# Patient Record
Sex: Male | Born: 1950 | Race: Black or African American | Hispanic: No | Marital: Single | Smoking: Current every day smoker
Health system: Southern US, Community
[De-identification: ages and names within clinical notes are randomized; demographics above are authoritative.]

## PROBLEM LIST (undated history)

## (undated) DIAGNOSIS — R55 Syncope and collapse: Secondary | ICD-10-CM

## (undated) DIAGNOSIS — R4689 Other symptoms and signs involving appearance and behavior: Secondary | ICD-10-CM

## (undated) DIAGNOSIS — F209 Schizophrenia, unspecified: Secondary | ICD-10-CM

## (undated) DIAGNOSIS — R569 Unspecified convulsions: Secondary | ICD-10-CM

## (undated) DIAGNOSIS — G2401 Drug induced subacute dyskinesia: Secondary | ICD-10-CM

## (undated) DIAGNOSIS — E785 Hyperlipidemia, unspecified: Secondary | ICD-10-CM

## (undated) DIAGNOSIS — I10 Essential (primary) hypertension: Secondary | ICD-10-CM

## (undated) DIAGNOSIS — E559 Vitamin D deficiency, unspecified: Secondary | ICD-10-CM

## (undated) DIAGNOSIS — K219 Gastro-esophageal reflux disease without esophagitis: Secondary | ICD-10-CM

## (undated) DIAGNOSIS — E162 Hypoglycemia, unspecified: Secondary | ICD-10-CM

## (undated) DIAGNOSIS — N390 Urinary tract infection, site not specified: Secondary | ICD-10-CM

## (undated) DIAGNOSIS — E871 Hypo-osmolality and hyponatremia: Secondary | ICD-10-CM

## (undated) DIAGNOSIS — F0390 Unspecified dementia without behavioral disturbance: Secondary | ICD-10-CM

## (undated) DIAGNOSIS — F419 Anxiety disorder, unspecified: Secondary | ICD-10-CM

## (undated) HISTORY — PX: EYE SURGERY: SHX253

## (undated) HISTORY — PX: FOOT SURGERY: SHX648

---

## 2005-08-26 ENCOUNTER — Emergency Department: Payer: Self-pay | Admitting: Emergency Medicine

## 2005-08-27 ENCOUNTER — Other Ambulatory Visit: Payer: Self-pay

## 2005-09-04 ENCOUNTER — Emergency Department: Payer: Self-pay | Admitting: Emergency Medicine

## 2005-09-04 ENCOUNTER — Other Ambulatory Visit: Payer: Self-pay

## 2012-07-08 ENCOUNTER — Emergency Department: Payer: Self-pay | Admitting: Emergency Medicine

## 2012-07-08 LAB — COMPREHENSIVE METABOLIC PANEL
Albumin: 3.6 g/dL (ref 3.4–5.0)
Anion Gap: 8 (ref 7–16)
Bilirubin,Total: 0.3 mg/dL (ref 0.2–1.0)
Calcium, Total: 8.6 mg/dL (ref 8.5–10.1)
Co2: 24 mmol/L (ref 21–32)
Creatinine: 0.9 mg/dL (ref 0.60–1.30)
EGFR (Non-African Amer.): >60
Glucose: 113 mg/dL — ABNORMAL HIGH (ref 65–99)
Potassium: 4.7 mmol/L (ref 3.5–5.1)
SGOT(AST): 21 U/L (ref 15–37)
Sodium: 128 mmol/L — ABNORMAL LOW (ref 136–145)

## 2012-07-08 LAB — URINALYSIS, COMPLETE
Blood: NEGATIVE
Glucose,UR: 50 mg/dL (ref 0–75)
Ketone: NEGATIVE
Nitrite: NEGATIVE
Ph: 7 (ref 4.5–8.0)
WBC UR: 2 /HPF (ref 0–5)

## 2012-07-08 LAB — DRUG SCREEN, URINE
Amphetamines, Ur Screen: NEGATIVE (ref ?–1000)
Barbiturates, Ur Screen: NEGATIVE (ref ?–200)
Benzodiazepine, Ur Scrn: NEGATIVE (ref ?–200)
Cocaine Metabolite,Ur ~~LOC~~: NEGATIVE (ref ?–300)
Opiate, Ur Screen: NEGATIVE (ref ?–300)

## 2012-07-08 LAB — ETHANOL
Ethanol %: <0.003 % (ref 0.000–0.080)
Ethanol: <3 mg/dL

## 2012-07-08 LAB — CBC
HCT: 35.1 % — ABNORMAL LOW (ref 40.0–52.0)
HGB: 11.3 g/dL — ABNORMAL LOW (ref 13.0–18.0)
MCH: 29.1 pg (ref 26.0–34.0)
MCHC: 32.2 g/dL (ref 32.0–36.0)
MCV: 90 fL (ref 80–100)
RDW: 13.7 % (ref 11.5–14.5)
WBC: 7.2 10*3/uL (ref 3.8–10.6)

## 2012-07-09 LAB — BASIC METABOLIC PANEL
Anion Gap: 7 (ref 7–16)
BUN: 16 mg/dL (ref 7–18)
Calcium, Total: 8.8 mg/dL (ref 8.5–10.1)
Co2: 24 mmol/L (ref 21–32)
Creatinine: 0.89 mg/dL (ref 0.60–1.30)
EGFR (African American): >60
Glucose: 129 mg/dL — ABNORMAL HIGH (ref 65–99)
Potassium: 4.5 mmol/L (ref 3.5–5.1)

## 2013-05-25 ENCOUNTER — Observation Stay: Payer: Self-pay | Admitting: Internal Medicine

## 2013-05-25 LAB — COMPREHENSIVE METABOLIC PANEL
Albumin: 3.7 g/dL (ref 3.4–5.0)
Alkaline Phosphatase: 72 U/L
BUN: 22 mg/dL — ABNORMAL HIGH (ref 7–18)
Chloride: 96 mmol/L — ABNORMAL LOW (ref 98–107)
Co2: 28 mmol/L (ref 21–32)
EGFR (African American): >60
EGFR (Non-African Amer.): >60
Glucose: 134 mg/dL — ABNORMAL HIGH (ref 65–99)
Osmolality: 262 (ref 275–301)
Potassium: 5.4 mmol/L — ABNORMAL HIGH (ref 3.5–5.1)

## 2013-05-25 LAB — TSH: Thyroid Stimulating Horm: 1.31 u[IU]/mL

## 2013-05-25 LAB — DRUG SCREEN, URINE
Amphetamines, Ur Screen: NEGATIVE (ref ?–1000)
Barbiturates, Ur Screen: NEGATIVE (ref ?–200)
Benzodiazepine, Ur Scrn: NEGATIVE (ref ?–200)
Cannabinoid 50 Ng, Ur ~~LOC~~: NEGATIVE (ref ?–50)
Cocaine Metabolite,Ur ~~LOC~~: NEGATIVE (ref ?–300)
MDMA (Ecstasy)Ur Screen: NEGATIVE (ref ?–500)
Phencyclidine (PCP) Ur S: NEGATIVE (ref ?–25)
Tricyclic, Ur Screen: POSITIVE (ref ?–1000)

## 2013-05-25 LAB — URINALYSIS, COMPLETE
Bilirubin,UR: NEGATIVE
Blood: NEGATIVE
Glucose,UR: NEGATIVE mg/dL (ref 0–75)
Ketone: NEGATIVE
Nitrite: NEGATIVE
Ph: 6 (ref 4.5–8.0)
Protein: NEGATIVE
Squamous Epithelial: <1

## 2013-05-25 LAB — VALPROIC ACID LEVEL: Valproic Acid: 63 ug/mL

## 2013-05-25 LAB — CBC
HGB: 10.4 g/dL — ABNORMAL LOW (ref 13.0–18.0)
MCH: 27.8 pg (ref 26.0–34.0)
MCHC: 31 g/dL — ABNORMAL LOW (ref 32.0–36.0)
Platelet: 428 10*3/uL (ref 150–440)
RBC: 3.76 10*6/uL — ABNORMAL LOW (ref 4.40–5.90)
RDW: 14.2 % (ref 11.5–14.5)
WBC: 7.5 10*3/uL (ref 3.8–10.6)

## 2013-05-25 LAB — ACETAMINOPHEN LEVEL: Acetaminophen: <2 ug/mL

## 2013-05-25 LAB — SALICYLATE LEVEL: Salicylates, Serum: <1.7 mg/dL

## 2013-05-25 LAB — ETHANOL: Ethanol %: <0.003 % (ref 0.000–0.080)

## 2013-05-25 LAB — HEMOGLOBIN A1C: Hemoglobin A1C: 6.9 % — ABNORMAL HIGH (ref 4.2–6.3)

## 2013-05-26 LAB — LIPID PANEL
Cholesterol: 134 mg/dL (ref 0–200)
Ldl Cholesterol, Calc: 74 mg/dL (ref 0–100)

## 2013-05-26 LAB — BASIC METABOLIC PANEL
Calcium, Total: 9.4 mg/dL (ref 8.5–10.1)
Chloride: 97 mmol/L — ABNORMAL LOW (ref 98–107)
Co2: 24 mmol/L (ref 21–32)
Creatinine: 1.04 mg/dL (ref 0.60–1.30)
EGFR (African American): >60
EGFR (Non-African Amer.): >60
Glucose: 115 mg/dL — ABNORMAL HIGH (ref 65–99)
Osmolality: 258 (ref 275–301)
Potassium: 4.9 mmol/L (ref 3.5–5.1)

## 2013-09-08 ENCOUNTER — Inpatient Hospital Stay: Payer: Self-pay | Admitting: Internal Medicine

## 2013-09-08 LAB — CBC
HCT: 36.2 % — AB (ref 40.0–52.0)
HGB: 12 g/dL — ABNORMAL LOW (ref 13.0–18.0)
MCH: 29.9 pg (ref 26.0–34.0)
MCHC: 33.1 g/dL (ref 32.0–36.0)
MCV: 91 fL (ref 80–100)
PLATELETS: 281 10*3/uL (ref 150–440)
RBC: 4 10*6/uL — ABNORMAL LOW (ref 4.40–5.90)
RDW: 14.9 % — ABNORMAL HIGH (ref 11.5–14.5)
WBC: 7.1 10*3/uL (ref 3.8–10.6)

## 2013-09-08 LAB — URINALYSIS, COMPLETE
Bilirubin,UR: NEGATIVE
Blood: NEGATIVE
Glucose,UR: NEGATIVE mg/dL (ref 0–75)
Ketone: NEGATIVE
NITRITE: NEGATIVE
PH: 8 (ref 4.5–8.0)
PROTEIN: NEGATIVE
RBC,UR: 2 /HPF (ref 0–5)
SPECIFIC GRAVITY: 1.008 (ref 1.003–1.030)
Squamous Epithelial: NONE SEEN

## 2013-09-08 LAB — COMPREHENSIVE METABOLIC PANEL
ALT: 22 U/L (ref 12–78)
AST: 15 U/L (ref 15–37)
Albumin: 3.3 g/dL — ABNORMAL LOW (ref 3.4–5.0)
Alkaline Phosphatase: 63 U/L
Anion Gap: 5 — ABNORMAL LOW (ref 7–16)
BUN: 12 mg/dL (ref 7–18)
Bilirubin,Total: 0.3 mg/dL (ref 0.2–1.0)
CALCIUM: 9.3 mg/dL (ref 8.5–10.1)
Chloride: 95 mmol/L — ABNORMAL LOW (ref 98–107)
Co2: 26 mmol/L (ref 21–32)
Creatinine: 0.99 mg/dL (ref 0.60–1.30)
EGFR (African American): >60
Glucose: 146 mg/dL — ABNORMAL HIGH (ref 65–99)
Osmolality: 256 (ref 275–301)
POTASSIUM: 4.5 mmol/L (ref 3.5–5.1)
SODIUM: 126 mmol/L — AB (ref 136–145)
Total Protein: 7.2 g/dL (ref 6.4–8.2)

## 2013-09-08 LAB — VALPROIC ACID LEVEL: VALPROIC ACID: 77 ug/mL

## 2013-09-08 LAB — TROPONIN I

## 2013-09-09 LAB — BASIC METABOLIC PANEL
Anion Gap: 8 (ref 7–16)
BUN: 14 mg/dL (ref 7–18)
CO2: 22 mmol/L (ref 21–32)
Calcium, Total: 8.9 mg/dL (ref 8.5–10.1)
Chloride: 97 mmol/L — ABNORMAL LOW (ref 98–107)
Creatinine: 0.85 mg/dL (ref 0.60–1.30)
Glucose: 153 mg/dL — ABNORMAL HIGH (ref 65–99)
Osmolality: 259 (ref 275–301)
POTASSIUM: 4.6 mmol/L (ref 3.5–5.1)
SODIUM: 127 mmol/L — AB (ref 136–145)

## 2013-09-11 LAB — URINE CULTURE

## 2013-09-13 LAB — CULTURE, BLOOD (SINGLE)

## 2013-11-04 ENCOUNTER — Inpatient Hospital Stay: Payer: Self-pay | Admitting: Internal Medicine

## 2013-11-04 LAB — DRUG SCREEN, URINE
Amphetamines, Ur Screen: NEGATIVE (ref ?–1000)
BARBITURATES, UR SCREEN: NEGATIVE (ref ?–200)
Benzodiazepine, Ur Scrn: NEGATIVE (ref ?–200)
COCAINE METABOLITE, UR ~~LOC~~: NEGATIVE (ref ?–300)
Cannabinoid 50 Ng, Ur ~~LOC~~: NEGATIVE (ref ?–50)
MDMA (ECSTASY) UR SCREEN: NEGATIVE (ref ?–500)
Methadone, Ur Screen: NEGATIVE (ref ?–300)
Opiate, Ur Screen: NEGATIVE (ref ?–300)
Phencyclidine (PCP) Ur S: NEGATIVE (ref ?–25)
Tricyclic, Ur Screen: POSITIVE (ref ?–1000)

## 2013-11-04 LAB — COMPREHENSIVE METABOLIC PANEL
ALBUMIN: 3.8 g/dL (ref 3.4–5.0)
Alkaline Phosphatase: 64 U/L
Anion Gap: 5 — ABNORMAL LOW (ref 7–16)
BUN: 15 mg/dL (ref 7–18)
Bilirubin,Total: 0.4 mg/dL (ref 0.2–1.0)
CREATININE: 1.03 mg/dL (ref 0.60–1.30)
Calcium, Total: 8.7 mg/dL (ref 8.5–10.1)
Chloride: 94 mmol/L — ABNORMAL LOW (ref 98–107)
Co2: 23 mmol/L (ref 21–32)
EGFR (African American): >60
GLUCOSE: 40 mg/dL — AB (ref 65–99)
Osmolality: 243 (ref 275–301)
Potassium: 4.2 mmol/L (ref 3.5–5.1)
SGOT(AST): 34 U/L (ref 15–37)
SGPT (ALT): 18 U/L (ref 12–78)
SODIUM: 122 mmol/L — AB (ref 136–145)
Total Protein: 7.3 g/dL (ref 6.4–8.2)

## 2013-11-04 LAB — URINALYSIS, COMPLETE
Bilirubin,UR: NEGATIVE
Glucose,UR: NEGATIVE mg/dL (ref 0–75)
Ketone: NEGATIVE
Nitrite: NEGATIVE
PH: 6 (ref 4.5–8.0)
PROTEIN: NEGATIVE
Specific Gravity: 1.005 (ref 1.003–1.030)
Squamous Epithelial: 1
WBC UR: 120 /HPF (ref 0–5)

## 2013-11-04 LAB — CBC
HCT: 32.2 % — AB (ref 40.0–52.0)
HGB: 10.6 g/dL — ABNORMAL LOW (ref 13.0–18.0)
MCH: 29.6 pg (ref 26.0–34.0)
MCHC: 33 g/dL (ref 32.0–36.0)
MCV: 90 fL (ref 80–100)
PLATELETS: 245 10*3/uL (ref 150–440)
RBC: 3.59 10*6/uL — ABNORMAL LOW (ref 4.40–5.90)
RDW: 14.5 % (ref 11.5–14.5)
WBC: 4.9 10*3/uL (ref 3.8–10.6)

## 2013-11-04 LAB — TROPONIN I: Troponin-I: <0.02 ng/mL

## 2013-11-05 LAB — MAGNESIUM: Magnesium: 1.8 mg/dL

## 2013-11-05 LAB — BASIC METABOLIC PANEL
ANION GAP: 5 — AB (ref 7–16)
BUN: 12 mg/dL (ref 7–18)
CO2: 25 mmol/L (ref 21–32)
CREATININE: 0.91 mg/dL (ref 0.60–1.30)
Calcium, Total: 8.5 mg/dL (ref 8.5–10.1)
Chloride: 97 mmol/L — ABNORMAL LOW (ref 98–107)
EGFR (African American): >60
EGFR (Non-African Amer.): >60
Glucose: 108 mg/dL — ABNORMAL HIGH (ref 65–99)
Osmolality: 256 (ref 275–301)
Potassium: 4.2 mmol/L (ref 3.5–5.1)
Sodium: 127 mmol/L — ABNORMAL LOW (ref 136–145)

## 2013-11-05 LAB — HEMOGLOBIN A1C: Hemoglobin A1C: 6.1 % (ref 4.2–6.3)

## 2013-11-06 LAB — BASIC METABOLIC PANEL
ANION GAP: 5 — AB (ref 7–16)
BUN: 10 mg/dL (ref 7–18)
CO2: 24 mmol/L (ref 21–32)
Calcium, Total: 8.7 mg/dL (ref 8.5–10.1)
Chloride: 98 mmol/L (ref 98–107)
Creatinine: 0.86 mg/dL (ref 0.60–1.30)
EGFR (African American): >60
EGFR (Non-African Amer.): >60
Glucose: 123 mg/dL — ABNORMAL HIGH (ref 65–99)
OSMOLALITY: 256 (ref 275–301)
Potassium: 4.3 mmol/L (ref 3.5–5.1)
SODIUM: 127 mmol/L — AB (ref 136–145)

## 2013-11-06 LAB — URINE CULTURE

## 2013-11-09 ENCOUNTER — Emergency Department: Payer: Self-pay | Admitting: Emergency Medicine

## 2013-11-09 LAB — URINALYSIS, COMPLETE
Bilirubin,UR: NEGATIVE
Blood: NEGATIVE
Glucose,UR: NEGATIVE mg/dL (ref 0–75)
Ketone: NEGATIVE
Leukocyte Esterase: NEGATIVE
NITRITE: NEGATIVE
PH: 7 (ref 4.5–8.0)
Protein: NEGATIVE
Specific Gravity: 1.003 (ref 1.003–1.030)
Squamous Epithelial: 1

## 2013-11-09 LAB — CULTURE, BLOOD (SINGLE)

## 2013-11-10 LAB — COMPREHENSIVE METABOLIC PANEL
ALBUMIN: 3.8 g/dL (ref 3.4–5.0)
ALK PHOS: 62 U/L
Anion Gap: 5 — ABNORMAL LOW (ref 7–16)
BUN: 12 mg/dL (ref 7–18)
Bilirubin,Total: 0.3 mg/dL (ref 0.2–1.0)
CALCIUM: 9.1 mg/dL (ref 8.5–10.1)
CREATININE: 0.85 mg/dL (ref 0.60–1.30)
Chloride: 91 mmol/L — ABNORMAL LOW (ref 98–107)
Co2: 26 mmol/L (ref 21–32)
EGFR (African American): >60
EGFR (Non-African Amer.): >60
GLUCOSE: 162 mg/dL — AB (ref 65–99)
OSMOLALITY: 249 (ref 275–301)
POTASSIUM: 4.9 mmol/L (ref 3.5–5.1)
SGOT(AST): 28 U/L (ref 15–37)
SGPT (ALT): 25 U/L (ref 12–78)
SODIUM: 122 mmol/L — AB (ref 136–145)
Total Protein: 7.4 g/dL (ref 6.4–8.2)

## 2013-11-10 LAB — CBC
HCT: 32.6 % — ABNORMAL LOW (ref 40.0–52.0)
HGB: 10.4 g/dL — AB (ref 13.0–18.0)
MCH: 28.8 pg (ref 26.0–34.0)
MCHC: 31.9 g/dL — ABNORMAL LOW (ref 32.0–36.0)
MCV: 90 fL (ref 80–100)
PLATELETS: 293 10*3/uL (ref 150–440)
RBC: 3.61 10*6/uL — ABNORMAL LOW (ref 4.40–5.90)
RDW: 14.4 % (ref 11.5–14.5)
WBC: 5.3 10*3/uL (ref 3.8–10.6)

## 2013-11-10 LAB — URINALYSIS, COMPLETE
BACTERIA: NONE SEEN
Bilirubin,UR: NEGATIVE
Blood: NEGATIVE
Glucose,UR: NEGATIVE mg/dL (ref 0–75)
KETONE: NEGATIVE
Leukocyte Esterase: NEGATIVE
Nitrite: NEGATIVE
Ph: 7 (ref 4.5–8.0)
Protein: NEGATIVE
Specific Gravity: 1.005 (ref 1.003–1.030)
Squamous Epithelial: <1
WBC UR: 2 /HPF (ref 0–5)

## 2013-11-10 LAB — URIC ACID: Uric Acid: 3.4 mg/dL — ABNORMAL LOW (ref 3.5–7.2)

## 2013-11-10 LAB — DRUG SCREEN, URINE

## 2013-11-10 LAB — ETHANOL
Ethanol %: <0.003 % (ref 0.000–0.080)
Ethanol: <3 mg/dL

## 2013-11-10 LAB — OSMOLALITY: Osmolality: 276 mOsm/kg — ABNORMAL LOW (ref 280–301)

## 2013-11-10 LAB — SALICYLATE LEVEL

## 2013-11-10 LAB — ACETAMINOPHEN LEVEL: Acetaminophen: <2 ug/mL

## 2013-11-11 LAB — SODIUM, URINE, RANDOM: SODIUM, URINE RANDOM: 45 mmol/L (ref 20–110)

## 2013-11-11 LAB — VALPROIC ACID LEVEL: Valproic Acid: 34 ug/mL — ABNORMAL LOW

## 2013-11-11 LAB — OSMOLALITY, URINE: OSMOLALITY: 212 mosm/kg

## 2013-11-12 LAB — COMPREHENSIVE METABOLIC PANEL
Albumin: 3.5 g/dL (ref 3.4–5.0)
Alkaline Phosphatase: 53 U/L
Anion Gap: 5 — ABNORMAL LOW (ref 7–16)
BILIRUBIN TOTAL: 0.3 mg/dL (ref 0.2–1.0)
BUN: 21 mg/dL — ABNORMAL HIGH (ref 7–18)
Calcium, Total: 9.4 mg/dL (ref 8.5–10.1)
Chloride: 97 mmol/L — ABNORMAL LOW (ref 98–107)
Co2: 26 mmol/L (ref 21–32)
Creatinine: 0.95 mg/dL (ref 0.60–1.30)
GLUCOSE: 120 mg/dL — AB (ref 65–99)
Osmolality: 261 (ref 275–301)
Potassium: 4.9 mmol/L (ref 3.5–5.1)
SGOT(AST): 21 U/L (ref 15–37)
SGPT (ALT): 26 U/L (ref 12–78)
SODIUM: 128 mmol/L — AB (ref 136–145)
TOTAL PROTEIN: 7 g/dL (ref 6.4–8.2)

## 2013-11-12 LAB — CBC
HCT: 34.9 % — ABNORMAL LOW (ref 40.0–52.0)
HGB: 11.6 g/dL — AB (ref 13.0–18.0)
MCH: 30.4 pg (ref 26.0–34.0)
MCHC: 33.4 g/dL (ref 32.0–36.0)
MCV: 91 fL (ref 80–100)
Platelet: 314 10*3/uL (ref 150–440)
RBC: 3.83 10*6/uL — ABNORMAL LOW (ref 4.40–5.90)
RDW: 14.4 % (ref 11.5–14.5)
WBC: 7.1 10*3/uL (ref 3.8–10.6)

## 2013-11-13 ENCOUNTER — Inpatient Hospital Stay: Payer: Self-pay | Admitting: Psychiatry

## 2013-11-13 LAB — VALPROIC ACID LEVEL: Valproic Acid: 49 ug/mL — ABNORMAL LOW

## 2013-11-14 LAB — BASIC METABOLIC PANEL
Anion Gap: 5 — ABNORMAL LOW (ref 7–16)
BUN: 22 mg/dL — ABNORMAL HIGH (ref 7–18)
Calcium, Total: 9.1 mg/dL (ref 8.5–10.1)
Chloride: 92 mmol/L — ABNORMAL LOW (ref 98–107)
Co2: 27 mmol/L (ref 21–32)
Creatinine: 1.02 mg/dL (ref 0.60–1.30)
GLUCOSE: 141 mg/dL — AB (ref 65–99)
OSMOLALITY: 255 (ref 275–301)
Potassium: 5.2 mmol/L — ABNORMAL HIGH (ref 3.5–5.1)
Sodium: 124 mmol/L — ABNORMAL LOW (ref 136–145)

## 2013-11-15 LAB — BASIC METABOLIC PANEL
Anion Gap: 4 — ABNORMAL LOW (ref 7–16)
BUN: 14 mg/dL (ref 7–18)
CALCIUM: 9 mg/dL (ref 8.5–10.1)
Chloride: 93 mmol/L — ABNORMAL LOW (ref 98–107)
Co2: 27 mmol/L (ref 21–32)
Creatinine: 0.86 mg/dL (ref 0.60–1.30)
EGFR (African American): >60
EGFR (Non-African Amer.): >60
Glucose: 136 mg/dL — ABNORMAL HIGH (ref 65–99)
OSMOLALITY: 252 (ref 275–301)
Potassium: 5 mmol/L (ref 3.5–5.1)
Sodium: 124 mmol/L — ABNORMAL LOW (ref 136–145)

## 2013-11-15 LAB — HEMOGLOBIN A1C: HEMOGLOBIN A1C: 5.8 % (ref 4.2–6.3)

## 2013-11-16 LAB — AMMONIA: Ammonia, Plasma: 16 mcmol/L (ref 11–32)

## 2013-11-17 LAB — SODIUM: Sodium: 123 mmol/L — ABNORMAL LOW (ref 136–145)

## 2013-11-19 LAB — SODIUM: Sodium: 125 mmol/L — ABNORMAL LOW (ref 136–145)

## 2013-11-21 LAB — SODIUM: Sodium: 125 mmol/L — ABNORMAL LOW (ref 136–145)

## 2013-11-23 LAB — VALPROIC ACID LEVEL: Valproic Acid: 60 ug/mL

## 2014-04-22 ENCOUNTER — Emergency Department: Payer: Self-pay | Admitting: Emergency Medicine

## 2014-04-22 LAB — URINALYSIS, COMPLETE
BILIRUBIN, UR: NEGATIVE
Blood: NEGATIVE
Ketone: NEGATIVE
Nitrite: NEGATIVE
Ph: 8 (ref 4.5–8.0)
Protein: 100
Specific Gravity: 1.019 (ref 1.003–1.030)
Squamous Epithelial: 1

## 2014-04-22 LAB — CBC
HCT: 37.3 % — AB (ref 40.0–52.0)
HGB: 12 g/dL — AB (ref 13.0–18.0)
MCH: 29.8 pg (ref 26.0–34.0)
MCHC: 32.2 g/dL (ref 32.0–36.0)
MCV: 93 fL (ref 80–100)
PLATELETS: 273 10*3/uL (ref 150–440)
RBC: 4.04 10*6/uL — ABNORMAL LOW (ref 4.40–5.90)
RDW: 15.3 % — AB (ref 11.5–14.5)
WBC: 6 10*3/uL (ref 3.8–10.6)

## 2014-04-22 LAB — COMPREHENSIVE METABOLIC PANEL
AST: 31 U/L (ref 15–37)
Albumin: 3.4 g/dL (ref 3.4–5.0)
Alkaline Phosphatase: 81 U/L
Anion Gap: 9 (ref 7–16)
BUN: 22 mg/dL — ABNORMAL HIGH (ref 7–18)
Bilirubin,Total: 0.4 mg/dL (ref 0.2–1.0)
CALCIUM: 9 mg/dL (ref 8.5–10.1)
CO2: 24 mmol/L (ref 21–32)
Chloride: 100 mmol/L (ref 98–107)
Creatinine: 1.46 mg/dL — ABNORMAL HIGH (ref 0.60–1.30)
EGFR (Non-African Amer.): 52 — ABNORMAL LOW
Glucose: 173 mg/dL — ABNORMAL HIGH (ref 65–99)
Osmolality: 274 (ref 275–301)
POTASSIUM: 5.1 mmol/L (ref 3.5–5.1)
SGPT (ALT): 42 U/L
SODIUM: 133 mmol/L — AB (ref 136–145)
Total Protein: 7.8 g/dL (ref 6.4–8.2)

## 2014-04-22 LAB — PROTIME-INR
INR: 1
PROTHROMBIN TIME: 13.5 s (ref 11.5–14.7)

## 2014-04-22 LAB — APTT: Activated PTT: 28.3 secs (ref 23.6–35.9)

## 2014-04-22 LAB — TROPONIN I

## 2014-04-24 LAB — URINE CULTURE

## 2014-06-07 ENCOUNTER — Ambulatory Visit: Payer: Self-pay | Admitting: Urology

## 2014-07-07 ENCOUNTER — Emergency Department: Payer: Self-pay | Admitting: Emergency Medicine

## 2014-07-07 LAB — COMPREHENSIVE METABOLIC PANEL
Albumin: 3.3 g/dL — ABNORMAL LOW (ref 3.4–5.0)
Alkaline Phosphatase: 73 U/L (ref 46–116)
Anion Gap: 8 (ref 7–16)
BILIRUBIN TOTAL: 0.4 mg/dL (ref 0.2–1.0)
BUN: 19 mg/dL — AB (ref 7–18)
CHLORIDE: 104 mmol/L (ref 98–107)
CO2: 25 mmol/L (ref 21–32)
Calcium, Total: 9 mg/dL (ref 8.5–10.1)
Creatinine: 1.1 mg/dL (ref 0.60–1.30)
Glucose: 98 mg/dL (ref 65–99)
OSMOLALITY: 276 (ref 275–301)
POTASSIUM: 4.5 mmol/L (ref 3.5–5.1)
SGOT(AST): 29 U/L (ref 15–37)
SGPT (ALT): 39 U/L (ref 14–63)
Sodium: 137 mmol/L (ref 136–145)
TOTAL PROTEIN: 7.6 g/dL (ref 6.4–8.2)

## 2014-07-07 LAB — URINALYSIS, COMPLETE
Bilirubin,UR: NEGATIVE
Blood: NEGATIVE
Glucose,UR: 50 mg/dL (ref 0–75)
Ketone: NEGATIVE
NITRITE: POSITIVE
Ph: 8 (ref 4.5–8.0)
RBC,UR: 24 /HPF (ref 0–5)
SPECIFIC GRAVITY: 1.016 (ref 1.003–1.030)
Squamous Epithelial: <1

## 2014-07-07 LAB — CBC
HCT: 36.7 % — ABNORMAL LOW (ref 40.0–52.0)
HGB: 11.8 g/dL — ABNORMAL LOW (ref 13.0–18.0)
MCH: 30.2 pg (ref 26.0–34.0)
MCHC: 32 g/dL (ref 32.0–36.0)
MCV: 94 fL (ref 80–100)
Platelet: 270 10*3/uL (ref 150–440)
RBC: 3.89 10*6/uL — ABNORMAL LOW (ref 4.40–5.90)
RDW: 14.4 % (ref 11.5–14.5)
WBC: 6 10*3/uL (ref 3.8–10.6)

## 2014-07-07 LAB — DRUG SCREEN, URINE

## 2014-07-07 LAB — ACETAMINOPHEN LEVEL: Acetaminophen: <2 ug/mL

## 2014-07-07 LAB — SALICYLATE LEVEL

## 2014-07-07 LAB — TSH: Thyroid Stimulating Horm: 1.09 u[IU]/mL

## 2014-07-07 LAB — ETHANOL: Ethanol: <3 mg/dL

## 2014-09-24 NOTE — Consult Note (Signed)
PATIENT NAME:  Scott [name], Scott [name] MR#:  [id number] DATE OF BIRTH:  04/13/1951  DATE OF CONSULTATION:  07/09/2012  CONSULTING PHYSICIAN:  Gonzella Lex, MD  IDENTIFYING INFORMATION AND REASON FOR CONSULT:  A 64 year old man with a history of schizophrenia, brought to the Emergency Room under involuntary commitment petition. Consult for evaluation of appropriate psychiatric treatment.   HISTORY OF PRESENT ILLNESS:  Information obtained from the patient and the chart. Commitment petition states that the patient has been confused and talking out of his head recently. Expresses concern that his medication is not right. The patient is not very clear why he was brought into the hospital. He reports that he has recently had a urinary tract infection, but that he feels like it has gotten all better. He has been given some medication and says that he also was told he needed to drink a lot of water and that seems to have helped to clear it up. The patient states that he is compliant with his medicine. He denies any suicidal or homicidal ideation. Denies any current hallucinations. Denies that he has been behaving in a dangerous manner or having any run-ins with law enforcement. He describes himself as being able to take care of himself adequately at home. His brother is his guardian and comes by to see him regularly. The patient has aggressive community treatment services that come by frequently to drop off his medicine and check on him. The patient denies that he has been drinking alcohol or abusing any drugs.   PAST PSYCHIATRIC HISTORY:  The patient states that his last hospitalization was at the [facility] in [place] in 2012. He has been treated with Haldol Decanoate. Has a history of chronic schizophrenia. Denies any past history of suicidality or violence.  His current medication list according to the information forwarded to [place] includes Haldol Decanoate, nortriptyline 25 mg at night, Depakote 250 mg in the  morning and 500 mg at night, and Artane 5 mg twice a day. Also on some medications for COPD and dyslipidemia and gastric reflux.   SUBSTANCE ABUSE HISTORY:  The patient says he has not had a drink in 20 years. He denies that he abuses any other drugs.   SOCIAL HISTORY:  He lives independently in an apartment, but his brother is his guardian. The patient has ACT team services in the community. He says that he has support and friends in the neighborhood. It sounds like he probably spends most of his time around his apartment. According to the brother, the patient has been calling ambulances and having himself taken into the hospital for several days in a row. The patient's chief complaint about that today is his urinary tract infection.   PAST MEDICAL HISTORY:  Evidently, he does have a urinary tract infection with some evidence of that still on his urine analysis today. He has not been to the hospital to have it treated. He remembers that they told him to drink a lot of water, and so he has been trying to drink as much water as he can. Other than that, he has COPD and dyslipidemia and gastric reflux, based on his prescribed medications.   REVIEW OF SYSTEMS:  The patient has no specific complaints. Says that his mood feels fine. Denies hallucinations. Denies suicidal or homicidal ideation. Says that he feels full in his stomach, but that his bladder infection is feeling all better. Denies that he is having any acute pain. Denies any breathing problems or GI problems  or chest pain.   MENTAL STATUS EXAM:  A mildly disheveled man, who looks his stated age or older. Cooperative with the interview. Good eye contact. Normal psychomotor activity. Speech is dysarthric related to his tardive dyskinesia, but easy to understand if one concentrates.  Normal tone. Affect somewhat constricted but not inappropriate. Mood stated as being okay. Thoughts are a little bit scattered with a tendency towards tangential  discussions, but he did not make any grossly bizarre or paranoid statements. Denies hallucinations. Denies suicidal or homicidal ideation. The patient appears to probably be of average to low average baseline intelligence with preserved judgment and insight right now.   CURRENT MEDICATIONS:  According to the referral notes we have been given, he is taking ProAir 90 mcg inhaler as needed for shortness of breath, nortriptyline 25 mg at night, atorvastatin 20 mg at night, omeprazole 20 mg a day, Flonase nasal inhaler 2 sprays each nostril a day, Haldol Decanoate 100 mg a month, promethazine syrup every 8 hours as needed for shortness of breath, Keflex 500 mg once by mouth every 8 hours, which looks like it was just started 2 days ago, presumably that was for the urinary tract infection; metformin 1000 mg twice a day, fluoxetine 20 mg 3 of them in the morning, Depakote 250 mg in the morning and 500 at night, Artane 5 mg twice a day, docusate 100 mg twice a day.   ALLERGIES:  No known drug allergies.   LABORATORY DATA:  Drug screen positive for tricyclic antidepressants related to his nortriptyline use. Urinalysis still shows positive glucose, 1+ leukocyte esterase, 2 white blood cells. No bacteria. TSH normal. Alcohol undetectable. Glucose slightly elevated at 113, but sodium low at 128 and chloride low at 96. Hemoglobin low at 11.3, hematocrit low at 35.1.   ASSESSMENT: A 64 year old man with schizophrenia. Currently, he is not reporting any dangerous ideation and not showing any dangerous or aggressive behavior. He understands he needs to stay on his medication and is compliant with it. The brother has been reporting that he has been behaving strangely. ACT team has been visiting the patient and thinks it is probably related to his recently diagnosed urinary tract infection. On top of that, he currently has a low sodium of 128, which is almost certainly due to his own overindulgence in water, which he did in  order to treat the urinary tract infection. Currently, the patient is not meeting commitment criteria. Has an appropriate and safe place to stay and appropriate outpatient treatment in the community.   TREATMENT PLAN:  The patient is agreeable to getting his Haldol Decanoate shot right now. I am also going to give 1 gram shot of Rocephin for his urinary tract infection. He will be educated that he should stop overindulging in water. ACT team will be advised of his condition and will continue to follow up with him. I have talked with his brother who is agreeable to picking him up later.   DIAGNOSIS PRINCIPLE AND PRIMARY:  AXIS I: Schizophrenia, undifferentiated.   SECONDARY DIAGNOSES: AXIS I:  No further.  AXIS II:  No diagnosis.  AXIS III:  Urinary tract infection, diabetes on oral medication, chronic obstructive pulmonary disease, dyslipidemia, gastric reflux.  AXIS IV:  Moderate. Chronic stress from burden of illness.  AXIS V:  Functioning at time of discharge 26.    ____________________________ Gonzella Lex, MD jtc:dm D: 07/09/2012 10:55:55 ET T: 07/09/2012 11:41:39 ET JOB#: [id number]  cc: Gonzella Lex, MD, <Dictator> [facility]  Salley Scarlet MD ELECTRONICALLY SIGNED 07/10/2012 0:23
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
## 2014-09-24 NOTE — Consult Note (Signed)
Brief Consult Note: Diagnosis: schizophrenia.   Patient was seen by consultant.   Consult note dictated.   Orders entered.   Comments: Psychiatry: Patient seen. Has hx schizophrenia. Petitioned by brother. PAtient denies any SI or HI and is not aggressive. ACT team sees him and he is generally complient with meds. Has had recent UTI partially treated. Has also been over drinking water. No longer commitable. Will dc IVC. Spoke to brother who can pick him up.Will give Haldol dec 100mg  im today.  Electronic Signatures: Gonzella Lex (MD)  (Signed 05-Feb-14 10:43)  Authored: Brief Consult Note   Last Updated: 05-Feb-14 10:43 by Gonzella Lex (MD)

## 2014-09-24 NOTE — Consult Note (Signed)
Brief Consult Note: Diagnosis: Schizoaffective disorder depressed type. UTI delirium now resolved.   Patient was seen by consultant.   Recommend further assessment or treatment.   Orders entered.   Comments: Scott [name] has a h/o mental illness here for AMS from UTI. He received antibiotics and is alert and oriented, back to baseline. He is not suicidal, homicidal or psychotic.  PLAN: 1. The patient no longer meets criteria for IVC. I will terminate proceedings. Please discharge as appropriate.   2. He is to continue all medications as prescribed by [name], his primary psychiatrist. No Rx necessary.   3. We will call [name]  and his ACT team to let them know about discharge.  Electronic Signatures: Orson Slick (MD)  (Signed 23-Dec-14 14:52)  Authored: Brief Consult Note   Last Updated: 23-Dec-14 14:52 by Orson Slick (MD)

## 2014-09-24 NOTE — Discharge Summary (Signed)
PATIENT NAME:  Scott [name], Scott [name] MR#:  [id number] DATE OF BIRTH:  Aug 03, 1950  DATE OF ADMISSION:  05/25/2013 DATE OF DISCHARGE:  05/26/2013  PRIMARY CARE PHYSICIAN: Nonlocal.   CONSULTATIONS:  Behavior medicine physician, Dr. Bary Leriche.     DISCHARGE DIAGNOSES: 1.  Psychosis in a patient with a history of schizophrenia.  2.  Urinary tract infection.  3.  Hyperkalemia.  4.  Chronic hyponatremia.  5.  Hypertension.  6.  Diabetes.   CONDITION: Stable.   CODE STATUS: Full code.   HOME MEDICATIONS: Please refer to the [facility] physician discharge instruction medication reconciliation list.   DIET: Low-sodium, low-fat, low-cholesterol, ADA diet.   ACTIVITY: As tolerated.   FOLLOWUP CARE: Follow with PCP within 1 to 2 weeks. Follow up with psychiatrist, [name], as outpatient.   REASON FOR ADMISSION: Urinary tract infection, hyponatremia.   HOSPITAL COURSE: The patient is a 64 year old African American male with a history of schizophrenia, hypertension, diabetes, seizure disorder, was sent to ED for altered mental status and danger to self. For detailed history and physical examination, please refer to the admission note dictated by Dr. Darvin Neighbours. On admission date, laboratory data showed BUN 22, creatinine 1.11, glucose 134, sodium 128, potassium 5.4. Urine drug screen positive with tricyclic antidepressant. Urinalysis showed 1+ bacteriuria and 40 WBC. Chest x-ray no acute abnormality. Alcohol level less than 0.003.  1.  Psychosis with a history of schizophrenia. Behavior Medicine physician, Dr. Bary Leriche, evaluated the patient and suggested to continue schizophrenia medication. The patient was placed on sitter p.r.n. and IVC; however, Dr. Bary Leriche evaluated the patient said today, suggested to discontinue IVC and follow up with [name], the patient's psychiatrist, as outpatient.  2.  For UTI, the patient was treated with Rocephin. Will give Cipro p.o. for 5 days. The patient's white  count is normal.  3.  Hyperkalemia. After admission, the patient with repeat BMP potassium decreased to 4.9.  4.  Hyponatremia has been stable. The last sodium is 127. Which is the patient's baseline.   The patient has no complaints. Vital signs are stable. Physical examination is unremarkable.  The patient will be discharged and to home today. I discussed the patient's discharge plan with the patient, case Freight forwarder, Education officer, museum.   TIME SPENT: About 38 minutes.   ____________________________ Demetrios Loll, MD qc:cs D: 05/26/2013 15:05:00 ET T: 05/26/2013 15:23:58 ET JOB#: [id number]  cc: Demetrios Loll, MD, <Dictator> Demetrios Loll MD ELECTRONICALLY SIGNED 05/26/2013 17:06
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
## 2014-09-24 NOTE — Consult Note (Signed)
Brief Consult Note: Diagnosis: Schizoaffective disorder depressed type. UTI delirium, Hyponatremia from Depakote?.   Patient was seen by consultant.   Recommend further assessment or treatment.   Orders entered.   Comments: Mr. Junkin has a h/o mental illness and freguent UTI. His psychistrist sent him over worried about new infection. Reportedly, he has been compliant with medications and is on injectable haldol decanoate.   PLAN: 1. Will continue all medications as in the community: Prozac 60 mg, Artane 5 mg bid, Haldol dec injection 100 mg on 06/12/2013.   2. The patient is on Depakote, level pending. He has hyponatremnia, sometimes caused by Depakote. Will continue for now.   3. Psychiatry will follow along.  Electronic Signatures: Orson Slick (MD)  (Signed 22-Dec-14 13:54)  Authored: Brief Consult Note   Last Updated: 22-Dec-14 13:54 by Orson Slick (MD)

## 2014-09-24 NOTE — H&P (Signed)
PATIENT NAME:  Scott [name], Scott [name] MR#:  [id number] DATE OF BIRTH:  12-Sep-1950  DATE OF CONSULTATION:  05/25/2013  REFERRING PHYSICIAN:  Elta Guadeloupe R. Jacqualine Code, MD, of Emergency Room  CONSULTING PHYSICIAN:  [place]. Ruqayya Ventress, MD  REASON FOR CONSULTATION: Urinary tract infection, hyponatremia.   HISTORY OF PRESENT ILLNESS: A 64 year old male patient with history of schizophrenia, diabetes, hypertension, seizure disorder and tardive dyskinesia, presents to the Emergency Room brought in by the family for concern of altered mental status and danger to self. The patient presently is lying in bed, seems calm. He does say that he is at [facility] although he is not sure why he is in the hospital. Mentioned that he has had UTI in the past and was treated for this. His speech is a little difficult to understand but he mentions that his family is out to get him. He also mentions that they have taken all his possessions. He lives at home with 1 of his nephews. He states that he would like to get out of the hospital tomorrow. An IVC is in place as the patient was thought to be a danger to self. Presently, no family is at bedside. He seems to have mostly tangential thoughts.   PAST MEDICAL HISTORY: 1.  Diabetes mellitus type 2.  2.  Hypertension.  3.  Schizophrenia.  4.  Tardive dyskinesia.  5.  Seizures.  6.  Arthritis.  7.  GERD.    8.  Tobacco abuse.  9.  Chronic hyponatremia.   ALLERGIES: No known drug allergies.   SOCIAL HISTORY: The patient seems to live alone with 1 of his family members being his guardian. Does smoke every day although he is not sure how much. No alcohol. No illicit drugs. Does have a history of marijuana abuse.   REVIEW OF SYSTEMS: Unobtainable secondary to the patient's confusion although he denies any pain, shortness of breath and nausea.   HOME MEDICATIONS: Include:  1.  Fluoxetine 60 mg oral once a day.  2.  Docusate sodium 100 mg oral 2 times a day.  3.   Divalproex oral once a day at bedtime 2 tablets and 1 tablet in the morning.  4.  The patient was on multiple other medications although it is not clear if he stopped taking them or we do not have his complete home medication list. He was on diabetes and high blood pressure medications along with statin and multivitamin in the past.   FAMILY HISTORY: Reviewed but could not be obtained from the patient.   PHYSICAL EXAMINATION: VITAL SIGNS: Temperature 97.5, pulse of 78, blood pressure 122/80, saturating 98% on room air.  GENERAL: Obese African American male patient lying in bed, seems calm and pleasant.  HEENT: Atraumatic, normocephalic. Oral mucosa moist and pink. External ears and nose normal. No pallor. No icterus. Pupils bilaterally equal and reactive to light. NECK:  Supple, no thyromegaly or palpable lymph nodes. Trachea midline. No carotid bruit or JVD.  CARDIOVASCULAR: S1, S2, without any murmurs. Peripheral pulses 2+.  RESPIRATORY: Normal work of breathing. Clear to auscultation on both sides.  GASTROINTESTINAL: Soft abdomen, nontender. Bowel sounds present, no visceromegaly  palpable.  GENITOURINARY: No significant bladder distention.  SKIN: Warm and dry. No petechiae, rash, ulcers.  MUSCULOSKELETAL: No joint swelling, redness, effusion of the large joints. Normal muscle tone.  NEUROLOGICAL: Motor strength 5 out of 5 in upper and lower extremities. Sensation is intact all over.  LYMPHATIC: No cervical lymphadenopathy.  PSYCHIATRIC: Has  tardive dyskinesia with difficult-to-understand speech. Seems to have tangential thoughts and some delusions although he denies any suicidal ideation or thoughts of hurting himself. He is oriented to person, place, but not to time.   LABORATORY, DIAGNOSTIC AND RADIOLOGICAL DATA:   1.  Glucose 134, BUN 22, creatinine 1.11, sodium 128, potassium 5.4, chloride of 96, GFR greater than 60. AST, alkaline phosphatase and bilirubin normal.  2.  TSH of 1.31.   3.  Urine drug screen positive for tricyclic antidepressants.  4.  WBC 7.5, hemoglobin 10.4, platelets of 428.  5.  Urinalysis shows 1+ bacteria and 40 WBC.  6.  Acetaminophen less than 2, salicylates less than 1.7. Alcohol less than 0.003.  7.  Chest x-ray, PA and lateral, shows nothing acute.   ASSESSMENT AND PLAN: 1.  Psychosis in a patient with history of schizophrenia. The patient does have a urinary tract  flexion but is afebrile with normal white count. Blood pressure and heart rate are normal. I believe his symptoms are more to do with his schizophrenia, mental disorder rather than urinary tract infection. I have discussed with Dr. Jacqualine Code of Emergency Room. The patient will be seen by psychiatrist and once that consult is done we can better figure out if patient needs any medical admission but I believe he will do well on oral antibiotics after giving a dose of IV ceftriaxone here in the Emergency Room.  2.  Hyponatremia. Seems chronic and stable.  3.  Hyperkalemia of 5.4. The patient has been on ACE inhibitor with lisinopril.  I would hold this and repeat potassium in 3 to 4 days.  4.  Diabetes mellitus. Can continue home medications.   Discussed the case with the ER physician, Delman Kitten, MD, who will discuss case with the psychiatrist.   TIME SPENT: Today on this consult was 40 minutes.  AFter discussing with psychicatry it was felt pt has more of delirium due to UTI and is being admitted for IV abx. Transfer to psych when better. ____________________________ Leia Alf Mikhala Kenan, MD srs:cs D: 05/25/2013 14:04:53 ET T: 05/25/2013 14:38:06 ET JOB#: [id number]  cc: Alveta Heimlich R. Darvin Neighbours, MD, <Dictator> Neita Carp MD ELECTRONICALLY SIGNED 05/25/2013 16:38
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
## 2014-09-25 NOTE — Consult Note (Signed)
PATIENT NAME:  Scott [name], Scott [name] MR#:  [id number] DATE OF BIRTH:  Sep 18, 1950  DATE OF CONSULTATION:  11/10/2013  REFERRING PHYSICIAN:  Elta Guadeloupe R. Jacqualine Code, MD CONSULTING PHYSICIAN:  Cordelia Pen. Gretel Acre, MD  REASON FOR CONSULTATION: "I don't know why I'm here. I want my money. Call Megan."   HISTORY OF PRESENT ILLNESS: The patient is a 64 year old African American male who presented to the ER after he became increasingly agitated at the [facility]. He appears to be an unreliable historian. Patient was noted to be mumbling, his tongue appears somewhat swollen. He has a history of schizophrenia and he is currently living at the [facility]. According to the history, which was obtained by Amy who is the director, the patient was sent to the [facility] from the medical floor on 09/11/2013. He was able to follow directions until approximately last week. She reported on 11/09/2013 he started pacing and became more agitated and paranoid. He started urinating outside the building. He was taking other employees' drinks. He was brought to the ER and was treated for urinary tract infection and hypoglycemia. He was agitated, paranoid and demanding the staff to the money  he owed. He again was noted to be urinating in the front of the building and was smoking in his room and putting it out on the carpet. The patient was combative and was brought to the ER as he was not manageable at the [facility]. They were concerned about his behavior as well as  his interaction with other staff and peers.   During my interview, the patient was asking me to call his sister as well as to call Vicente Serene, who is taking care of his money. He reported that he lives in [place] all of his life. He reported that when he gets the shot if calms his nerves down. The patient's tongue was noted to be a little bit swollen and it was moving around in his mouth. He has history of tardive dyskinesia in the past. He reported that he yells and shouts but  nobody listens to him. He appeared to be rambling during the interview. He appears to have poor insight about his mental illness as well.   PAST PSYCHIATRIC HISTORY: The patient has history of schizophrenia and is currently following with [name], who has been giving him Haldol decanoate injections 125 mg IM every 4 weeks. He has recently received his injection on 11/05/2013. He also gets Artane to control the tardive dyskinesias. The patient is currently compliant with his medications. No history of suicide attempts known.   PAST MEDICAL HISTORY: The patient has history of diabetes, hypertension, tardive dyskinesia, seizure, arthritis, GERD, tobacco abuse, chronic hyponatremia.   ALLERGIES: No known drug allergies.   CURRENT HOME MEDICATIONS: Sertraline 100 mg p.o. daily, omeprazole 20 mg p.o. daily, nortriptyline 25 mg at bedtime, naproxen 500 mg p.o. b.i.d., multivitamin 1 tablet daily, Lopressor 50 mg once daily, metformin 1000 mg b.i.d., lisinopril 40 mg 1-1/2 tablets once a day, glipizide 10 mg daily, Colace 100 mg b.i.d., Depakote 250 mg in the morning and 500 mg 3 pills at bedtime, cetirizine 10 mg daily, cephalexin 500 mg q.12 hours, atorvastatin 20 mg at bedtime, Norvasc 10 mg daily, Artane 5 mg p.o. b.i.d.   PHYSICAL EXAMINATION:  VITAL SIGNS: Temperature 97.9, pulse 68, respirations 18, blood pressure 128/82.   LABORATORY DATA:  Glucose 198, BUN 12, creatinine 0.85 sodium 122, potassium 4.9, chloride 91, bicarbonate 26, anion gap 5.  Uric acid is  3.4. UDS is negative. WBC 5.3, hemoglobin is 10.4, hematocrit is 32.6, RDW is 14.4.   MENTAL STATUS EXAMINATION: The patient is a moderately-built male who appears somewhat disheveled and lying in the bed. He has some movement of the tongue noted at this time. Gait and station appears normal. His speech was rambling, difficult to understand. Thought process was tangential. Loose associations were noted. Thought content was delusional and  unable  to contract for safety at this time. Insight and judgment were poor. Awake, alert and oriented. Recent and remote memory were poor. Attention span and concentration were short. Language was difficult to understand. Mood was fine. Affect was congruent.   DIAGNOSTIC IMPRESSION: AXIS I:  Schizophrenia, chronic, paranoid type.  AXIS II: None reported.  AXIS III: Please review the medical history.   TREATMENT PLAN: 1.  I will try to obtain collateral information from [name]' office about his medications.  2.  I will start him on Benadryl 25 mg p.o. t.i.d. to help with the tardive dyskinesia.  3.  Will order a Depakote level at this time.  4.  The patient will be monitored and will have his medications adjusted before he can return back to his group home. Will continue to have his medications adjusted will also tried to obtain alternate placement for this patient.   Thank you for allowing me to participate in the care of this patient.   ____________________________ Cordelia Pen. Gretel Acre, MD usf:cs D: 11/10/2013 14:12:25 ET T: 11/10/2013 18:27:58 ET JOB#: [id number]  cc: Cordelia Pen. Gretel Acre, MD, <Dictator> Jeronimo Norma MD ELECTRONICALLY SIGNED 11/19/2013 16:52

## 2014-09-25 NOTE — Discharge Summary (Signed)
PATIENT NAME:  Scott [name], Scott [name] MR#:  [id number] DATE OF BIRTH:  Apr 09, 1951  DATE OF ADMISSION:  09/08/2013  DATE OF DISCHARGE:  09/11/2013  PRESENTING COMPLAINT:  Confusion.   DISCHARGE DIAGNOSES:   1.  Acute encephalopathy secondary to his UTI.  2.  Chronic hyponatremia secondary to Depakote. 3.  Chronic schizophrenia.  4.  Hypertension.  5.  DVT. 6.  Type 2 diabetes.   PSYCHIATRY CONSULTATION:  Dr. Bary Leriche.   CODE STATUS: Full code.   DISPOSITION:  Patient is discharged to assisted living facility.   DISCHARGE MEDICATIONS: 1.  Docusate 100 mg b.i.d.  2.  Trihexyphenidyl 500 mg 3 tablets daily.  3.  Divalproex 250 extended release once a day.  4.  Amlodipine 10 mg daily.  5.  Metoprolol ER 50 mg daily.  6.  Omeprazole 20 mg daily.  7.  Lisinopril 40 mg 1-1/2 tablets p.o. daily.  8.  Metformin 1000 mg b.i.d.  9.  Sertraline 100 mg p.o. daily.  10. Atorvastatin 20 mg at bedtime.  11. Nortriptyline 25 mg at bedtime.  12. Cetirizine 10 mg daily.  13. Glipizide 10 mg daily.  14.  Multivitamin p.o. daily.  15. Naproxen 500 mg b.i.d. as needed.  16. Keflex 500 mg b.i.d.   DIET:  Carbohydrate controlled diet.   FOLLOW UP:  With Dr. Conley Canal, patients psychiatrist. Follow up with PCP in 1-2 weeks.   BRIEF SUMMARY OF HOSPITAL COURSE:  [name] is a 64 year old African American gentleman with history of chronic schizophrenia, type 2 diabetes, comes in with:  1.  Acute encephalopathy, which suspected due to UTI. He was started on IV Rocephin and changed to p.o. Keflex. Blood cultures were negative. The patient was afebrile.  2.  Hypertension. Continued his home medications. 3.  Chronic hyponatremia, which remained stable. Suspected due to chronic use of Depakote. 4.  Type 2 diabetes. Resume p.o. meds.  5.  Chronic schizophrenia. The patient was seen by Dr. Bary Leriche. Recommends to continue home medications. Patient did receive his IM Haldol dose recently. Spoke with  Dr. Conley Canal, patient psychiatrist. Patient is being recommended to go to an assisted living facility and arrangements have been made by care managers. Hospital stay otherwise remained stable. The patient remained a full code.   TIME SPENT:  40 minutes.     ____________________________ Scott Rochester Posey Pronto, MD sap:tc D: 09/15/2013 11:26:00 ET T: 09/15/2013 12:03:17 ET JOB#: [id number]  cc: Daniil Labarge A. Posey Pronto, MD, <Dictator>

## 2014-09-25 NOTE — H&P (Signed)
PATIENT NAME:  Scott [name], Scott [name] MR#:  [id number] DATE OF BIRTH:  1950/09/30  DATE OF ADMISSION:  09/08/2013  CHIEF COMPLAINT: Confusion.   HISTORY OF PRESENT ILLNESS: 64 year old African American male patient with history of schizophrenia, diabetes, hypertension, seizure disorder, and tardive dyskinesia presents to the Emergency Room after his social worker found him to be confused, not talking. Patient was diagnosed with UTI yesterday, was started on some antibiotic, but has worsened and brought to the Emergency Room here. Patient is nonverbal. He has refused to speak. His urinalysis suggests UTI. Patient has been given dose of IV ceftriaxone in the Emergency Room and is being admitted to hospitalist service for UTI and encephalopathy.   The patient was last admitted to the hospitalist service in December 2014 with similar complaints.   PAST MEDICAL HISTORY:  1.  Diabetes mellitus, type 2.  2.  Hypertension.  3.  Schizophrenia  4.  Mixtard of dyskinesia.  5.  Seizures.  6.  Arthritis.  7.  GERD.  8.  Tobacco abuse.  9.  Chronic hyponatremia around 125.   ALLERGIES:  No known drug allergies.   SOCIAL HISTORY: The patient lives alone with his nephew. He follows up with ACT team. He has history of marijuana abuse in the past and smokes every day. No alcohol.   REVIEW OF SYSTEMS: Unobtainable secondary to the patient's confusion.   HOME MEDICATIONS:  1.  Valproate 1500 mg oral once daily. 3.  Atorvastatin 20 mg daily.  4.  Cetirizine 10 mg daily.  5.  Docusate sodium 100 mg 2 times a day.  6.  Glipizide 10 mg once a day.  7.  Enalapril 40 mg once a day.  8.  Metformin 1000 mg oral 2 times a day. 9.  Metoprolol 50 mg oral once a day.  10. Multivitamin 1 tablet daily.  11. Naproxen 500 mg oral 2 times a day as needed.  12. Nortriptyline 25 mg oral once a day.  13. Omeprazole 20 mg once a day.  14. Sertraline 100 mg oral once a day.  15.  Trihexyphenidyl 5 mg oral 2 times a day.    FAMILY HISTORY: Reviewed but cannot be obtained from the patient or old records.   PHYSICAL EXAMINATION:  VITAL SIGNS: Temperature 98.4, pulse of 67, blood pressure 163/80, saturating 97% on room air.  GENERAL: Obese African American male patient lying in bed. Overall seems calm but has a flat affect. Alert and awake.  HEENT: Atraumatic, normocephalic. Oral mucosa cannot be checked, as the patient does not open his mouth. Pupils bilaterally equal and reactive to light.  NECK: Supple. No thyromegaly. No palpable lymph nodes. Trachea midline. No carotid bruit or JVD.   CARDIOVASCULAR: S1, S2, without any murmurs. Peripheral pulses 2+. No edema.  RESPIRATORY: Normal work of breathing. Clear to auscultation on both sides.  GASTROINTESTINAL: Soft abdomen, nontender. Bowel sounds present. No hepatosplenomegaly palpable.  GENITOURINARY: Has suprapubic tenderness. No CVA tenderness. Has a Foley catheter in place.  MUSCULOSKELETAL: No joint swelling, redness, effusion of the large joints. Normal muscle tone.  NEUROLOGICAL: Moves all 4 extremities equally.  LYMPHATIC: No cervical lymphadenopathy.   LABORATORY STUDIES:  WBC 7.1, hemoglobin 12, platelets of 281,000.   Urinalysis shows 59 WBC, 1+ bacteria with valproic acid level of 77.   Troponin less than 0.02.   AST, ALT, alkaline phosphatase, bilirubin normal. Sodium 126, potassium 4.5, bicarbonate 26, GFR greater than 60. Glucose 152.   EKG shows normal sinus rhythm, nonspecific ST  wave changes.   Chest x-ray shows no acute infiltrates, effusion.   CT of the head without contrast shows no acute intracranial abnormality, chronic  changes.   ASSESSMENT AND PLAN:  1.  Acute encephalopathy in a patient with a UTI. Admit patient for IV antibiotics and urine and blood cultures. Monitor his acute encephalopathy. Patient will be on fall precautions. We will also consult psychiatry as his schizophrenia could be playing a part. Restart all his  medications.  2.  Hypertension. Continue medications.  3.  Chronic hyponatremia stable.  4.  DVT prophylaxis with Lovenox.   CODE STATUS: Full code.   TIME SPENT: Time spent on this case 45 minutes.    ____________________________ Leia Alf Zoya Sprecher, MD srs:tc D: 09/08/2013 16:56:16 ET T: 09/08/2013 18:11:26 ET JOB#: [id number]  cc: Alveta Heimlich R. Darvin Neighbours, MD, <Dictator> Neita Carp MD ELECTRONICALLY SIGNED 09/16/2013 17:47

## 2014-09-25 NOTE — Consult Note (Signed)
Brief Consult Note: Diagnosis: Schizoaffective disorder depressed type. UTI delirium, Hyponatremia from Depakote?.   Patient was seen by consultant.   Recommend further assessment or treatment.   Orders entered.   Comments: [name] has a h/o mental illness and frequent UTIs. He was admitted to medical floor for new UTI with delirium.  Today the patient is not verbal but calm. I will talk to [name] his ACT psychiatrist tomorrow.   PLAN: 1. Please continue all medications as in the community: Zoloft 100 mg, Artane 5 mg bid, Nortriptiline 25 mg at HS. He is also on Haldol dec injection 100 mg, next one is due about now.    2. The patient is on Depakote, level therapeutic. He has chronic hyponatremnia likely caused by Depakote. Will continue for now.   3. Psychiatry will follow along.  Electronic Signatures: Orson Slick (MD)  (Signed 07-Apr-15 20:32)  Authored: Brief Consult Note   Last Updated: 07-Apr-15 20:32 by Orson Slick (MD)

## 2014-09-25 NOTE — H&P (Signed)
PATIENT NAME:  Scott [name], Scott [name] MR#:  [id number] DATE OF BIRTH:  [birth year]  DATE OF ADMISSION:  11/04/2013  PRIMARY CARE PHYSICIAN: Nonlocal.  REFERRING PHYSICIAN: Dr. Corky Downs.  CHIEF COMPLAINT: Altered mental status and low blood sugar.   HISTORY OF PRESENT ILLNESS: A 64 year old African American male with a history of hypertension, diabetes, was sent from rest home due to altered mental status and low blood sugar at 32. The patient is confused, unable to provide any information. According to Dr. Corky Downs, the patient is on glipizide and metformin. Blood sugar was noticed to be low at 32 and then sent to  ED for further evaluation. In ED, the patient's blood sugar was 40, was given D50 IV and started D5 half-normal saline IV. In addition, the patient was noticed to have a UTI. The patient is confused, unable to provide any information on review of systems.   PAST MEDICAL HISTORY: Hypertension, diabetes, schizophrenia, mixed tardive dyskinesia, seizure, arthritis, GERD, tobacco abuse, chronic hyponatremia.   SOCIAL HISTORY: The patient is living in rest home. From previous history, has a history of marijuana abuse in the past and smoked every day. No alcohol.   REVIEW OF SYSTEMS: Unable to obtain due to the patient's confusion status.   FAMILY HISTORY: Unable to obtain.   ALLERGIES: No.   HOME MEDICATIONS: Medication list not done yet, but from the outpatient medication list: 1. Sertraline 100 mg p.o. daily. 2. Omeprazole 20 mg p.o. daily.  3. Nortriptyline 25 mg p.o. at bedtime.  4. Naproxen 500 mg p.o. b.i.d. 5. Multivitamin 1 tablet once a day. 6. Lopressor 50 mg p.o. 1 tablet once a day. 7. Metformin 1000 mg p.o. b.i.d. 8. Lisinopril 40 mg p.o. 1-1/2 tablets once a day. 9. Glipizide 10 mg p.o. daily. 10. Colace 100 mg p.o. b.i.d.  11. Divalproex sodium extended-release 250 mg p.o. 1 tablet once a day and 500 mg p.o. 3 tablets once a day.  12. Cetirizine 10 mg p.o. once a day.   13. Cephalexin 500 mg p.o. q.12 hours.  14. Atorvastatin 20 mg p.o. at bedtime. 15. Norvasc 10 mg p.o. daily.  16. Trihexyphenidyl 5 mg p.o. b.i.d.   PHYSICAL EXAMINATION: VITAL SIGNS: Temperature 98.5, blood pressure 127/80, pulse 82, O2 saturation 99% on room air.  GENERAL: The patient is weak, confused. Follows commands, in no acute distress.  HEENT: Pupils round, equal, reactive to light and accommodation. Moist oral mucosa. Clear oropharynx.  NECK: Supple. No JVD or carotid bruits. No lymphadenopathy. No thyromegaly.  CARDIOVASCULAR: S1, S2, regular rate and rhythm. No murmurs or gallops. PULMONARY: Bilateral air entry. No wheezing or rales. No use of accessory muscles to breathe.  ABDOMEN: Soft. No distention or tenderness. No organomegaly. Bowel sounds present.  EXTREMITIES: No edema, clubbing or cyanosis. No calf tenderness. Bilateral pedal pulses present.  SKIN: No rash or jaundice.  NEUROLOGIC: The patient is confused but follows commands. No focal deficits. Power 5/5. Sensation intact.   LABORATORY, DIAGNOSTIC, AND RADIOLOGICAL DATA: Glucose now is 93. Urinalysis showed WBC 120, nitrite negative. Urine toxicology showed tricyclic antidepressant positive. Chest x-ray: Low lung volumes and basilar atelectasis. WBC 4.9, hemoglobin 10.6, platelets 245. Glucose 40, BUN 15, creatinine 1.03, sodium 122, potassium 4.2, chloride 94, bicarbonate 23. Troponin less than 0.02. EKG showed normal sinus rhythm at 81 BPM.   IMPRESSIONS: 1. Altered mental status, acute encephalopathy: Possibly due to hypoglycemia and urinary tract infection. 2. Hypoglycemia.  3. Urinary tract infection.  4. Hyponatremia.  5. Hypertension.  6. Diabetes.  7. Seizure disorder.  8. Schizophrenia.   PLAN OF TREATMENT: 1. The patient will be admitted to medical floor. We will hold glipizide and metformin and start a sliding scale.  2. We will give D5 normal saline IV. Follow up BMP with fluid restriction. 3. For  UTI, we will give Rocephin. Follow up urine culture.  4. For hypertension, continue the patient's home medication.    ____________________________ Demetrios Loll, MD qc:jcm D: 11/04/2013 14:33:00 ET T: 11/04/2013 15:09:44 ET JOB#: [id number]  cc: Demetrios Loll, MD, <Dictator> Demetrios Loll MD ELECTRONICALLY SIGNED 11/05/2013 15:57
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
## 2014-09-25 NOTE — Discharge Summary (Signed)
PATIENT NAME:  Scott [name], WHETSTINE MR#:  [id number] DATE OF BIRTH:  [birth year]  DATE OF ADMISSION:  11/04/2013 DATE OF DISCHARGE:   11/06/2013  ADMITTING DIAGNOSIS:  Altered mental status and low blood sugar.   DISCHARGE DIAGNOSES: 1.  Acute encephalopathy due to hypoglycemia, as well as urinary tract infection.  2.  Urinary tract infection due to Citrobacter.  3.  Hypoglycemia, likely due to poor oral intake and concurrent oral diabetic treatment for diabetes. Now, his treatment for diabetes has stopped.  4.  Hyponatremia, acute on chronic, felt to be due to dehydration, as well as chronic hyponatremia.  5.  Hypertension.  6.  Seizure disorder.  7.  Schizophrenia.   CONSULTANTS: None.   PERTINENT LABS AND EVALUATIONS: Admitting glucose 40, BUN 15, creatinine 1.03. Sodium was 122, potassium 4.2, chloride 94. LFTs were normal. Troponin less than 0.02. EKG: Normal sinus rhythm. Chest x-ray showed low lung volumes and basilar atelectasis. Urinalysis showed greater than 100,000 Citrobacter, was sensitive to everything except nitrofurantoin intermediate. Most recent blood work showed a sodium of 127. His latest blood sugar was 96.   HOSPITAL COURSE:  Please refer to H and P done by the admitting physician. The patient is a 64 year old African American male with history of hypertension, diabetes, was sent from rest home due to altered mental status and a low blood sugar of 32. The patient has been on glipizide and metformin, which were held. He was placed on D10 with improvement in his blood sugars. Now, he is taking adequate p.o. intake, and his blood sugars are stable. He was also noticed to have Citrobacter UTI, which is treated with p.o. antibiotics. At this time, he is doing much better and is close to his baseline and is stable for discharge. He will need outpatient close monitoring of his blood sugar.   DISCHARGE MEDICATIONS: Colace 100 mg 1 tab p.o. b.i.d., trihexyphenidyl 5 mg 1 tab p.o.  b.i.d., divalproex 250, 1 tab p.o. daily; amlodipine 10 daily, metoprolol extended release 50 daily, omeprazole 20 daily, lisinopril 60 mg daily, sertraline 100 daily, atorvastatin 20 at bedtime, nortriptyline 25 at bedtime, cetirizine 10 mg daily, multivitamins daily, naproxen 500 mg 1 tab p.o. b.i.d. as needed for pain, divalproex 500 mg extended release 3 caps daily, haloperidol 125 mg IM every 4 months, loperamide 2 mg p.r.n. for diarrhea, Mapap 500 mg 1 tab p.o. q.4 hours p.r.n. for pain or fever, Mi-Acid 400 mg-400 mg-40 mg/5 mL suspension 30 mL q.4 for indigestion or heartburn, milk of magnesia 15 mL at bedtime as needed for constipation, Q-Tussin 10 mL q.6 p.r.n., Triple Antibiotic apply topically to affected area p.r.n., Ceftin 500 mg 1 tab p.o. b.i.d. x 4 days. The patient told to stop taking metformin, glipizide.   Home health services with  a nurse visit to monitor blood sugars.   DIET: Low sodium, low fat, low cholesterol carbohydrate.   ACTIVITY:  As tolerated.   Follow with primary M.D. in 1 to 2 weeks. The patient to have blood sugar checked prior to each meal. Keep a log to take to his primary care provider.   35 minutes spent.   ____________________________ Lafonda Mosses. Posey Pronto, MD shp:dmm D: 11/06/2013 13:12:43 ET T: 11/06/2013 13:25:24 ET JOB#: [id number]  cc: Fifi Schindler H. Posey Pronto, MD, <Dictator> Alric Seton MD ELECTRONICALLY SIGNED 11/07/2013 14:33

## 2014-09-25 NOTE — Consult Note (Signed)
Brief Consult Note: Diagnosis: Schizoaffective disorder depressed type. UTI delirium, Hyponatremia from Depakote?.   Patient was seen by consultant.   Consult note dictated.   Recommend further assessment or treatment.   Orders entered.   Comments: Scott [name] has a h/o mental illness and frequent UTIs. He was admitted to medical floor for new UTI with delirium. Delirium now is resolved. Placement at assisted living facility, decided by his guardian, in progress. The patient in agreament. No behavioral problems.   MSE: Alert, oriented, pleasant, no safety issues, no psychosis.  PLAN: 1. Please continue all medications as in the community: Zoloft 100 mg, Artane 5 mg bid, Nortriptiline 25 mg at HS.   3. He did get Haldol dec injection 100 mg recently. Next injection on Sep 30, 2013.    4. The patient is on Depakote, level therapeutic. He has chronic hyponatremnia likely caused by Depakote.   5. Placement-Care management involved.    6. I will sign off.  Electronic Signatures: Orson Slick (MD)  (Signed 09-Apr-15 12:17)  Authored: Brief Consult Note   Last Updated: 09-Apr-15 12:17 by Orson Slick (MD)

## 2014-09-25 NOTE — H&P (Signed)
PATIENT NAME:  HADDON, FYFE MR#:  [id number] DATE OF BIRTH:  August 23, 1950  DATE OF ADMISSION:  11/10/2013  DATE OF ASSESSMENT: 11/13/2013  IDENTIFYING INFORMATION AND CHIEF COMPLAINT: A 64 year old man with a history of schizophrenia who was brought to the Emergency Room from his group home because of agitation and paranoia and disorganized behavior. Urinating in the house on the carpet, putting out cigarettes, being belligerent with staff. The patient is unable to give much useful history. He states that the people at [facility] where he was living owe him some money. He has no insight into his mood being disrupted. He denies having any hallucinations and says he has been compliant with his medication. Denies suicidal or homicidal ideation.   PAST PSYCHIATRIC HISTORY: Long history of established schizophrenia. For a long time he apparently lived with his brother. Has recently become more impaired and is now living at [facility]. He has been maintained for extended periods of time on Haldol decanoate, which had been very helpful in the past. In the last year he has had a few trips to the hospital here with worsening confusion that has turned out to be related to urinary tract infections. No known history of suicide.   SOCIAL HISTORY: The patient is incompetent and his brother is apparently his guardian. The patient is living at [facility]   FAMILY HISTORY: None known.   PAST MEDICAL HISTORY: He has high blood pressure that evidently has been so difficult to treat that it requires several medications. Also dyslipidemia. Also tardive dyskinesia and history of mild diabetes, controlled with oral medicine.   SUBSTANCE ABUSE HISTORY: No current alcohol or drug use. The patient apparently used to drink but it has been 10 to 20 years since he last was drinking.   CURRENT MEDICATIONS: At the time of admission, his medications are now Depakote 1000 mg twice a day, amlodipine 10 mg a day,  atorvastatin 20 mg a day, Ceftin 500 mg q. 12 hours for another 5 days, Zyrtec 10 mg per day, Benadryl 25 mg q. 8 hours, lisinopril 60 mg per day, Ativan 1 mg q. 4 hours p.r.n. for anxiety, metoprolol 50 mg a day, nortriptyline 25 mg at night, sertraline 100 mg per day, Artane 5 mg twice a day, and metformin 500 mg twice a day.   REVIEW OF SYSTEMS: The patient is again not a good historian but does not seem to have a specific complaint. Denies any pain, denies malaise or fevers. Denies cardiac, GI or pulmonary problems. Denies suicidal or homicidal ideation. Not consistent in answering questions about hallucinations.   MENTAL STATUS EXAMINATION: Slightly disheveled man who looks older than his stated age. He tries to be cooperative with the exam, but he is so disorganized in his thinking it is hard to follow. Eye contact decreased because he is looking all over the place. Speech is rapid, slurred, somewhat difficult to understand. Part of that it is the tardive dyskinesia. Affect is between euthymic and mildly euphoric. Mood is stated as all right. Thoughts are disorganized and hard to follow. Seems to be preoccupied with people owing him money. Denies hallucinations. Not clear whether he is responding to internal stimuli. Denies suicidal or homicidal ideation. The patient is not able to cooperate with cognitive testing, appears to be cognitively impaired. Fund of knowledge appears poor. Short and long-term memory both diminished to examination.   LABORATORY RESULTS: He has had several labs studies since coming into the hospital. Drug screen was negative.  Glucose initially somewhat elevated 162, sodium low 122, chloride low at 91.  Ethanol negative. Uric acid low. Blood osmolality low. Valproic acid level low at 34. It has been rechecked today and it is now only up to 49. Follow-up chemistry panel shows the sodium a little bit better at 128; that was done on the 11th.   ASSESSMENT: This 64 year old man with  a history of schizophrenia, also appears to have some chronic cognitive problems that could be schizophrenia or it could be related to something else, either vascular disease or perhaps developmental disorder. He has not been violent or disruptive, but he continues to be hyperverbal somewhat agitated and a little bizarre in his thinking and does not appear to be appropriate for going back to [facility] environment at this time. He has been compliant with his medications here. We are told by [facility] that he had been given his Haldol decanoate shot recently. However, I suspect there might have been some confusion about that. I gave him a Haldol decanoate shot of just 50 mg, less than his usual dose, 2 days ago in the Emergency Room and it seemed to make a clear difference in improving his behavior and function and ability to interact lucidly. The patient requires hospitalization for some further stabilization. They are uncomfortable with him returning to [facility] immediately because of his repeated hospitalizations. The patient had been in the hospital on the medical unit fairly recently. On June 5th he was admitted with encephalopathy and found to have a urinary tract infection and hypoglycemia. He was treated and sent home.   TREATMENT PLAN: The patient will be admitted to the psychiatry ward. He will be continued on his current medications and we will recheck some of his labs. I am increasing his Depakote a little further at this point. Monitor to see if we can control some of his agitation and manic-like presentation. Once he is doing better they have stated that they will take him back at [facility].   DIAGNOSIS, PRINCIPAL AND PRIMARY:  AXIS I: Schizophrenia.   SECONDARY DIAGNOSES: AXIS I: Rule out developmental disorder.  AXIS II: No diagnosis.  AXIS III: Hypertension, history of low blood sugar, history of recurrent urinary tract infections but current urine appears to be clean,  history of a seizure disorder in the past, tardive dyskinesia, dyslipidemia, allergies.  AXIS IV: Severe from chronic illness.  AXIS V: Functioning at time of evaluation is 35.   ____________________________ Gonzella Lex, MD jtc:sb D: 11/13/2013 15:17:40 ET T: 11/13/2013 15:51:58 ET JOB#: [id number]  cc: Gonzella Lex, MD, <Dictator> Gonzella Lex MD ELECTRONICALLY SIGNED 11/18/2013 13:24

## 2014-09-25 NOTE — Consult Note (Signed)
PATIENT NAME:  Scott [name], Scott [name] MR#:  [id number] DATE OF BIRTH:  January 19, 1951  DATE OF CONSULTATION:  11/11/2013  REFERRING PHYSICIAN:   CONSULTING PHYSICIAN:  Gonzella Lex, MD  IDENTIFYING INFORMATION: A 64 year old gentleman with a history of schizophrenia who was brought to the Emergency Room from his living facility because of a recent worsening of his agitation.   HISTORY OF PRESENT ILLNESS: Yesterday's consultation by Dr. Gretel Acre was reviewed. History reviewed. Met with the patient.  Patient is still not able to give any useful history. He is disorganized in his thinking, rambling and not making any sense. He talks about how he is going to go live with his sister and needs to be given his money back. Does not seem like he has calmed down any.   REVIEW OF SYSTEMS: Denies any pain, denies any fever or chills, denies any nausea. He is not having any specific complaints right now.   MENTAL STATUS EXAMINATION: Disheveled gentleman, poor eye contact. Speech is rambling, difficult to interrupt. Hard to understand in part because of how much he slurs his words. Also his sentences do not flow together and do not make sense. Denies suicidal ideation. Denies hallucinations, but not very convincingly. Affect constricted. Mood stated as upset. Could not cooperate with any cognitive testing.   LABORATORY RESULTS:  A valproic acid level was obtained and was 34 which is below the usual level. His blood glucoses are still moderately high. His most recent sodium from yesterday was down at 122.   ASSESSMENT:  A 64 year old man with schizophrenia, continues to be agitated and confused. He does not have a urinary tract infection. He is chronically hyponatremic. No other clear new medical problem involved. It is unclear to me exactly when he got his last Haldol Decanoate shot. I am told on the one hand that it was given on June 2nd. On the other hand, Dr. Anola Gurney note reports it was on June 4th. The patient was in  the hospital on June 4th and we have documentation that it did not happen then, unclear whether it happened on the 2nd. Clinically, this patient looks very much like he does when he is off his Haldol. Because he has not gotten better over night even with restarting his medication, I think it would be best to try restarting him on some antipsychotic.   TREATMENT PLAN: I am going to compromise in case he did get his Haldol shot by giving him only 50 mg of Haldol Decanoate today. He has been restarted on his Depakote. We will re-evaluate him tomorrow. Right now, he is uncooperative, not able to take care of himself, not appropriate for admission here. We can re-evaluate tomorrow and see how he is doing after getting his shot.   DIAGNOSIS, PRINCIPAL AND PRIMARY:  AXIS I: Schizophrenia, undifferentiated rule out delirium.    ____________________________ Gonzella Lex, MD jtc:dd D: 11/11/2013 17:41:09 ET T: 11/11/2013 17:54:49 ET JOB#: [id number]  cc: Gonzella Lex, MD, <Dictator> Gonzella Lex MD ELECTRONICALLY SIGNED 11/18/2013 13:24

## 2014-09-25 NOTE — Consult Note (Signed)
Psychiatry: Follow-up consult note.  64 year old man with a history of schizophrenia and dementia.  Today the patient tells me he is feeling much better.  His mood is feeling better and he feels stronger.  He is requesting that he can be discharged and go home.  On interview today he denies any specific physical symptoms.  Denies depression denies suicidal ideation denies hallucinations.  On exam he remains disheveled and easily distracted.  Eye contact however is much better.  Affect is smiling and reactive mood is stated as fine.  Thoughts are still disorganized but not quite as bizarre as yesterday and not as paranoid.  Denies auditory or visual hallucinations.  Denies suicidal or homicidal ideation.  He is taking his medication agreeably. who has a history of decompensating very easily either because of medical conditions or because of changes in medicines came into the hospital once again decompensated.  Unlike previous times here he did not appear to have a urinary tract infection.  I gave him a Haldol decanoate shot yesterday and today he is looking much better.  He does have some degree of tardive dyskinesia but does not seem to be bothered by it.  In my assessment he does not require inpatient treatment at this time. contacted his home.  They are uncomfortable with him coming back so soon.  We will recheck Depakote level tomorrow and discuss with him again the possibility of his returning home.  No change to medicines at this time.  Supportive and educational counseling done with the patient.  Electronic Signatures: Gonzella Lex (MD)  (Signed on 11-Jun-15 16:56)  Authored  Last Updated: 11-Jun-15 16:56 by Gonzella Lex (MD)

## 2014-09-25 NOTE — Discharge Summary (Signed)
PATIENT NAME:  Scott [name], Scott [name] MR#:  [id number] DATE OF BIRTH:  Nov 03, 1950  DATE OF ADMISSION:  11/13/2013 DATE OF DISCHARGE:  11/25/2013  HOSPITAL COURSE: See dictated history and physical for details of admission. A 64 year old man with a history of schizophrenia who recently had had a decline in his social situation because of having to move out of his brother's house. He had been having more problems with getting agitated and intrusive at the living facility where he was staying. He has had several admissions to the Emergency Room. On some of these occasions, he seemed to be delirious from a urinary tract infection, but on this particular one, there was no sign of that. Instead, he remained confused and mildly agitated; although not hostile, violent or aggressive. He was started back on his Haldol decanoate and his other psychiatric medications, which were gradually adjusted. I got rid of some of his excessive and unnecessary medication, although mainly he has been left on a similar psychiatric combination to what he had before. His vital signs have been stable. Blood sugars have been staying stable. The patient continues to have tardive dyskinesia, but, otherwise, medically seems to be doing quite well. His behavior has been good. He takes care of most of his ADLs and has kept himself clean. He has not been violent, aggressive or threatening. He does not appear to any longer need hospital level care. He is going to be discharged to a new family care home in the community and will have ACT team follow up through [facility].    There was concern expressed about his persistently low sodium. A medicine consult was obtained and thought that perhaps it was related to his Depakote. Depakote was discontinued briefly and he was switched to Keppra. During that time, he appeared to get more agitated, confused and paranoid. It also did not seem that it had any effect on his sodium which continued to be in the low  120s even after being off of the Depakote for several days. Because of this, I have switched him back to his Depakote which he continues to get good response from with his mood stability. He appears to simply keep the very chronic low sodium.   DISCHARGE MEDICATIONS: Depakote 2000 mg at night, nortriptyline 25 mg at night, Zyrtec 10 mg once a day, atorvastatin 20 mg a day, Artane 5 mg twice a day, haloperidol decanoate 125 mg IM every four weeks next dose due approximately July 8, metoprolol extended-release 50 mg once a day, amlodipine 10 mg once a day, docusate 100 mg twice a day, quetiapine 200 mg at night, diphenhydramine 25 mg 3 times a day, pantoprazole 40 mg 2 times a day.   MENTAL STATUS EXAMINATION: Casually dressed, adequately groomed gentleman, who looks his stated age, cooperative for the most part, although he is still loose and a bit disorganized in his thinking. He has adequate eye contact. Still paces a little bit, but is able to sit down and even attend some groups appropriately. His mood is stated as all right, his affect seems to usually be upbeat and smiling any more. Thoughts remain disorganized. At times, he gets fixated on what still seems to be some paranoia about his group home that he used to live at, owing him money, but he has not been intrusive about that. Denies suicidal or homicidal ideation. Judgment and insight chronically impaired, but he does have a guardian, Short and long-term memory impaired. Baseline fund of knowledge appears to be  impaired; difficult to test.   LABORATORY RESULTS: On admission this time, he had a drug screen negative. Urinalysis normal; no sign of infection. Hematocrit low at 32.6, hemoglobin low at 10.4, glucose elevated at 162, sodium low 122. Alcohol level negative. Uric acid 3.4. Valproic acid was low at 34. He has had multiple glucoses done throughout his hospital stay, and with his current regimen on his diet, and now they have mostly stabilized in  the low to mid 100s without needing any new change to medication. A repeat valproic acid level done on the 22nd was 60.   DIAGNOSES; PRINCIPAL AND PRIMARY:  AXIS I: Schizoaffective disorder, bipolar type, manic.   SECONDARY DIAGNOSES: AXIS I: No further.  AXIS II: Deferred.  AXIS III: Diabetes mostly, diet-controlled, hypertension, chronic constipation, tardive dyskinesia, chronic hyponatremia, chronic anemia.  AXIS IV: Severe, from change of living situation.  AXIS V: Functioning at time of discharge; 50.    ____________________________ Gonzella Lex, MD jtc:aj D: 11/25/2013 12:57:11 ET T: 11/26/2013 01:23:09 ET JOB#: [id number]  cc: Gonzella Lex, MD, <Dictator> Gonzella Lex MD ELECTRONICALLY SIGNED 11/28/2013 1:49

## 2014-09-25 NOTE — Discharge Summary (Signed)
PATIENT NAME:  Scott [name], Scott [name] MR#:  [id number] DATE OF BIRTH:  [birth year]  DATE OF ADMISSION:  09/08/2013 DATE OF DISCHARGE:  09/11/2013  PRESENTING COMPLAINT:  Confusion.   DISCHARGE DIAGNOSES:   1.  Acute encephalopathy secondary to his urinary tract infection.  2.  Chronic hyponatremia secondary to Depakote. 3.  Chronic schizophrenia.  4.  Hypertension.  5.  Deep vein thrombosis. 6.  Type 2 diabetes.   PSYCHIATRY CONSULTATION:  Dr. Bary Leriche.   CODE STATUS: FULL CODE.   DISPOSITION:  Patient is discharged to assisted living facility.   DISCHARGE MEDICATIONS: 1.  Docusate 100 mg b.i.d.  2.  Trihexyphenidyl 500 mg 3 tablets daily.  3.  Divalproex 250 extended release once a day.  4.  Amlodipine 10 mg daily.  5.  Metoprolol ER 50 mg daily.  6.  Omeprazole 20 mg daily.  7.  Lisinopril 40 mg 1-1/2 tablets p.o. daily.  8.  Metformin 1000 mg b.i.d.  9.  Sertraline 100 mg p.o. daily.  10. Atorvastatin 20 mg at bedtime.  11. Nortriptyline 25 mg at bedtime.  12. Cetirizine 10 mg daily.  13. Glipizide 10 mg daily.  14.  Multivitamin p.o. daily.  15. Naproxen 500 mg b.i.d. as needed.  16. Keflex 500 mg b.i.d.   DIET:  Carbohydrate controlled diet.   FOLLOW UP:  With Dr. Conley Canal, patient's psychiatrist. Follow up with PCP in 1-2 weeks.   BRIEF SUMMARY OF HOSPITAL COURSE:  Scott [name] is a 64 year old African American gentleman with history of chronic schizophrenia, type 2 diabetes, comes in with:  1. Acute encephalopathy, which suspected due to UTI. He was started on IV Rocephin and changed to p.o. Keflex. Blood cultures were negative. The patient was afebrile.  2. Hypertension. Continued his home medications. 3. Chronic hyponatremia, which remained stable. Suspected due to chronic use of Depakote. 4. Type 2 diabetes. Resume p.o. meds.  5. Chronic schizophrenia. The patient was seen by Dr. Bary Leriche. Recommends to continue home medications. Patient did receive his IM  Haldol dose recently. Spoke with Dr. Conley Canal, patient psychiatrist. Patient is being recommended to go to an assisted living facility and arrangements have been made by care managers. Hospital stay otherwise remained stable. The patient remained a FULL CODE.   TIME SPENT:  40 minutes.     ____________________________ Hart Rochester Posey Pronto, MD sap:tc D: 09/15/2013 11:26:00 ET T: 09/15/2013 12:03:17 ET JOB#: [id number]  cc: Jerrett Baldinger A. Posey Pronto, MD, <Dictator> Ilda Basset MD ELECTRONICALLY SIGNED 09/28/2013 9:07

## 2014-10-03 NOTE — Consult Note (Signed)
PATIENT NAME:  Scott [name], Scott [name] MR#:  [id number] DATE OF BIRTH:  Nov 07, 1950  DATE OF CONSULTATION:  07/07/2014  REFERRING PHYSICIAN:   CONSULTING PHYSICIAN:  Gonzella Lex, MD  IDENTIFYING INFORMATION AND REASON FOR CONSULTATION: A 64 year old man with a long history of schizophrenia, brought in by his group home.   CHIEF COMPLAINT: "I fell 7 times."   HISTORY OF PRESENT ILLNESS: Information obtained from the patient and the chart, and it pretty much lines up with what the group home was saying. Psychiatric intake coordinator spoke to the group home, who say that for just the past 2 or 3 days, the patient's behavior has been different than usual. He seemed a little bit more confused. He seems unsteady on his feet. Probably did have a couple of minor falls. On one occasion, he cursed at another resident, but did not get violent, and he went back and apologized for it. He has not been violent or threatening or suicidal. Not expressing any acute paranoia. To my evaluation today, the patient's chief complaint is that he has had some falls. He says his voices are about the same as usual, and he does not pay them any attention. He has no complaints about his mood. Denies suicidal or homicidal ideation. Does not have any other specific physical complaints. Says he has been compliant with all of his medications and does not have any complaints about the group home.   PAST PSYCHIATRIC HISTORY: Long history of schizophrenia, also multiple medical problems and similar presentations in the past, where it has turned out that urinary tract infection changes his behavior. He is on Haldol Decanoate, I believe, still. For many years, he lived with his brother, but since becoming more impaired and brother got older, the patient has been living in group homes for a few years now, but it looks like that seems to be working out well.   SOCIAL HISTORY: The patient is legally incompetent. The last time I checked, his  brother was his guardian. I am not sure if that is still the case.   FAMILY HISTORY: None known.   PAST MEDICAL HISTORY: High blood pressure, dyslipidemia, tardive dyskinesia, mild diabetes, recurrent urinary tract infections.   SUBSTANCE ABUSE HISTORY: No recent alcohol or drug use. It has been 10 or 20 years since he was last drinking.   CURRENT MEDICATIONS: Depakote 2000 mg at night, nortriptyline 25 mg at night, diphenhydramine 25 mg 3 times a day for allergies, cetirizine 10 mg once a day, atorvastatin 20 mg once a day, trihexiphenydil 5 mg 2 times a day, haloperidol decanoate 125 mg IM every 4 weeks, not sure when the last dose was. Quetiapine 200 mg at night, metoprolol 50 mg once a day extended-release, amlodipine 10 mg once a day, docusate 100 mg twice a day, pantoprazole 40 mg twice a day, ciprofloxacin just now starting.   ALLERGIES: No known drug allergies.   REVIEW OF SYSTEMS: Complains that he has been a little unsteady on his feet and has had some recent falls. No complaints about his mood. No suicidal ideation. Chronic auditory hallucinations that he will not bother to explain, but says he does not pay them any mind. No other physical complaints.   MENTAL STATUS EXAMINATION: A 64 year old gentleman, looks his stated age, cooperative and pleasant with the interview. He remembered me right away and was quite pleasant to speak with. Eye contact: Good. Psychomotor activity: Does look like he is a little off balance, and he has gone  to the bathroom several times just since being here, but he is lying down comfortably now. Speech is slurred, in large part because of his severe tardive dyskinesia. Decreased in total amount. Affect: Smiling and upbeat. Mood stated as fine. Thoughts are disorganized and scattered, some bizarre statements. Talks briefly about his parents having been thrown into s lake, apropos of nothing, and then giggles about it. Endorses auditory hallucinations. Denies  suicidal or homicidal ideation or any thought of harming anyone. He is alert and oriented to his situation here. Not sure of the date. Repeats 3 words immediately, remembers 1 of them at three minutes. Judgment and insight: Stable. Intelligence: Probably average to low average.   LABORATORY DATA: He has a urinalysis with 3+ leukocyte esterase, large protein, 331 white cells, some red cells as well, and positive bacteria, very positive for UTI. Salicylates and thyroid: Normal. Hematocrit and hemoglobin: Both very slightly low, probably chronic. Chemistry panel: Nothing remarkable.   VITAL SIGNS: Blood pressure not registered yet.   ASSESSMENT: This is a 64 year old man with schizophrenia and also a history of recurrent urinary tract infections, which historically have altered his mental state and made him off balance and kind of irritable. That looks like it is what is going on right now. The patient has chronic psychotic symptoms under good control with medicine and has not dangerous, threatening, or hostile. Does not require, and would not benefit, from inpatient psychiatric hospitalization.   TREATMENT PLAN: The case discussed with Dr. Jimmye Norman. It looks like he is starting him on ciprofloxacin. We have already talked to the group home, and they are willing to take him back with an adequate treatment plan. No change to any of his psychiatric medicines. The patient was educated about the situation and the fact that he can be discharged home and he was glad of it. He will continue to follow up with his usual outpatient psychiatric provider.   DIAGNOSIS, PRINCIPAL AND PRIMARY:  AXIS I: Delirium, mild, secondary to medical condition, urinary tract infection.   SECONDARY DIAGNOSES: AXIS I: Schizophrenia.  AXIS II: Deferred.  AXIS III: Urinary tract infection, hypertension, dyslipidemia.   ____________________________ Gonzella Lex, MD jtc:mw D: 07/07/2014 14:44:23 ET T: 07/07/2014 15:02:59  ET JOB#: [id number]  cc: Gonzella Lex, MD, <Dictator> Gonzella Lex MD ELECTRONICALLY SIGNED 07/14/2014 17:25

## 2015-03-29 ENCOUNTER — Emergency Department (HOSPITAL_COMMUNITY)
Admission: EM | Admit: 2015-03-29 | Discharge: 2015-03-29 | Disposition: A | Discharge status: Home / Self Care | Payer: Medicaid Other | Attending: Emergency Medicine | Admitting: Emergency Medicine

## 2015-03-29 ENCOUNTER — Encounter (HOSPITAL_COMMUNITY): Payer: Self-pay

## 2015-03-29 DIAGNOSIS — R300 Dysuria: Secondary | ICD-10-CM

## 2015-03-29 DIAGNOSIS — F419 Anxiety disorder, unspecified: Secondary | ICD-10-CM | POA: Insufficient documentation

## 2015-03-29 DIAGNOSIS — Z792 Long term (current) use of antibiotics: Secondary | ICD-10-CM | POA: Diagnosis not present

## 2015-03-29 DIAGNOSIS — E785 Hyperlipidemia, unspecified: Secondary | ICD-10-CM | POA: Insufficient documentation

## 2015-03-29 DIAGNOSIS — Z79899 Other long term (current) drug therapy: Secondary | ICD-10-CM | POA: Insufficient documentation

## 2015-03-29 DIAGNOSIS — G40909 Epilepsy, unspecified, not intractable, without status epilepticus: Secondary | ICD-10-CM | POA: Insufficient documentation

## 2015-03-29 DIAGNOSIS — K219 Gastro-esophageal reflux disease without esophagitis: Secondary | ICD-10-CM | POA: Diagnosis not present

## 2015-03-29 DIAGNOSIS — F209 Schizophrenia, unspecified: Secondary | ICD-10-CM | POA: Diagnosis not present

## 2015-03-29 DIAGNOSIS — I10 Essential (primary) hypertension: Secondary | ICD-10-CM | POA: Diagnosis not present

## 2015-03-29 DIAGNOSIS — Z720 Tobacco use: Secondary | ICD-10-CM | POA: Diagnosis not present

## 2015-03-29 DIAGNOSIS — F0390 Unspecified dementia without behavioral disturbance: Secondary | ICD-10-CM | POA: Diagnosis not present

## 2015-03-29 DIAGNOSIS — Z87440 Personal history of urinary (tract) infections: Secondary | ICD-10-CM | POA: Diagnosis not present

## 2015-03-29 DIAGNOSIS — R410 Disorientation, unspecified: Secondary | ICD-10-CM | POA: Diagnosis not present

## 2015-03-29 HISTORY — DX: Unspecified convulsions: R56.9

## 2015-03-29 HISTORY — DX: Hyperlipidemia, unspecified: E78.5

## 2015-03-29 HISTORY — DX: Unspecified dementia, unspecified severity, without behavioral disturbance, psychotic disturbance, mood disturbance, and anxiety: F03.90

## 2015-03-29 HISTORY — DX: Urinary tract infection, site not specified: N39.0

## 2015-03-29 HISTORY — DX: Anxiety disorder, unspecified: F41.9

## 2015-03-29 HISTORY — DX: Gastro-esophageal reflux disease without esophagitis: K21.9

## 2015-03-29 HISTORY — DX: Schizophrenia, unspecified: F20.9

## 2015-03-29 HISTORY — DX: Essential (primary) hypertension: I10

## 2015-03-29 HISTORY — DX: Hypoglycemia, unspecified: E16.2

## 2015-03-29 HISTORY — DX: Drug induced subacute dyskinesia: G24.01

## 2015-03-29 HISTORY — DX: Vitamin D deficiency, unspecified: E55.9

## 2015-03-29 LAB — URINALYSIS, ROUTINE W REFLEX MICROSCOPIC
BILIRUBIN URINE: NEGATIVE
Glucose, UA: 250 mg/dL — AB
Hgb urine dipstick: NEGATIVE
KETONES UR: NEGATIVE mg/dL
LEUKOCYTES UA: NEGATIVE
NITRITE: NEGATIVE
PH: 6 (ref 5.0–8.0)
PROTEIN: NEGATIVE mg/dL
Specific Gravity, Urine: <1.005 — ABNORMAL LOW (ref 1.005–1.030)
UROBILINOGEN UA: 1 mg/dL (ref 0.0–1.0)

## 2015-03-29 LAB — CBG MONITORING, ED: GLUCOSE-CAPILLARY: 139 mg/dL — AB (ref 65–99)

## 2015-03-29 MED ORDER — CEPHALEXIN 500 MG PO CAPS: 500.0000 mg | ORAL_CAPSULE | Freq: Two times a day (BID) | ORAL | Status: DC

## 2015-03-29 NOTE — Discharge Instructions (Signed)

## 2015-03-29 NOTE — ED Notes (Signed)
Patient from group home c/o painful ruination, odor, and confusion. Patient has a history of UTI per caregiver

## 2015-03-29 NOTE — ED Provider Notes (Signed)
CSN: [id number]     Arrival date & time 03/29/15  1918 History  By signing my name below, I, Scott [name], attest that this documentation has been prepared under the direction and in the presence of Scott Fraise, MD. Electronically Signed: Helane [name], ED Scribe. 03/29/2015. 10:36 PM.    Chief Complaint  Patient presents with  . Dysuria   Patient is a 64 y.o. male presenting with dysuria. The history is provided by the patient and a caregiver. No language interpreter was used.  Dysuria This is a recurrent problem. The current episode started more than 2 days ago. The problem occurs constantly. The problem has been gradually worsening. Pertinent negatives include no abdominal pain. Nothing aggravates the symptoms. Nothing relieves the symptoms.   HPI Comments: Scott [name] is a 64 y.o. male with a PMHx of UTI, HTN, DM, Schizophrenia, and Dementia who presents to the Emergency Department complaining of dysuria onset 3 days ago. He reports associated malodorous urine and confusion, but denies current pain. Per caregiver, pt has a PMHx of frequent UTI's. He notes that confusion is typical for pt with such infections. He reports pt is on a low dose of antibiotics (nitrofurantoin) because of the frequency of UTI's. He states pt lives in a group home and usually walks unassisted.  Pt denies fever, n/v, CP, penile pain or discharge, frequency, and abdominal pain.  Past Medical History  Diagnosis Date  . Hypertension   . Seizures ([place])   . Dementia   . Schizophrenia ([place])   . Tardive dyskinesia   . Hypoglycemia   . Urinary tract infection   . Dyslipidemia   . Anxiety   . GERD (gastroesophageal reflux disease)   . Vitamin D deficiency    History reviewed. No pertinent past surgical history. History reviewed. No pertinent family history. Social History  Substance Use Topics  . Smoking status: Current Every Day Smoker -- 0.50 packs/day    Types: Cigarettes  . Smokeless tobacco:  None  . Alcohol Use: No    Review of Systems  Constitutional: Negative for fever.  Gastrointestinal: Negative for nausea, vomiting and abdominal pain.  Genitourinary: Positive for dysuria. Negative for frequency, discharge and penile pain.  Psychiatric/Behavioral: Positive for confusion.    Allergies  Review of patient's allergies indicates no known allergies.  Home Medications   Prior to Admission medications   Medication Sig Start Date End Date Taking? Authorizing Provider  acetaminophen (TYLENOL) 500 MG tablet Take 500 mg by mouth every 6 (six) hours as needed.   Yes Historical Provider, MD  amLODipine (NORVASC) 10 MG tablet Take 10 mg by mouth daily.   Yes Historical Provider, MD  atorvastatin (LIPITOR) 20 MG tablet Take 20 mg by mouth daily.   Yes Historical Provider, MD  cetirizine (ZYRTEC) 10 MG tablet Take 10 mg by mouth daily.   Yes Historical Provider, MD  divalproex (DEPAKOTE) 500 MG DR tablet Take 2,000 mg by mouth at bedtime.   Yes Historical Provider, MD  docusate sodium (COLACE) 100 MG capsule Take 100 mg by mouth 2 (two) times daily.   Yes Historical Provider, MD  haloperidol (HALDOL) 5 MG tablet Take 5 mg by mouth 2 (two) times daily as needed for agitation.   Yes Historical Provider, MD  haloperidol decanoate (HALDOL DECANOATE) 100 MG/ML injection Inject 100 mg into the muscle every 28 (twenty-eight) days. For schizophrenia   Yes Historical Provider, MD  metoprolol succinate (TOPROL-XL) 50 MG 24 hr tablet Take 50 mg by mouth  daily. Take with or immediately following a meal.   Yes Historical Provider, MD  mirtazapine (REMERON) 15 MG tablet Take 15 mg by mouth at bedtime.   Yes Historical Provider, MD  pantoprazole (PROTONIX) 40 MG tablet Take 40 mg by mouth 2 (two) times daily.   Yes Historical Provider, MD  QUEtiapine (SEROQUEL) 200 MG tablet Take 200 mg by mouth at bedtime.   Yes Historical Provider, MD  QUEtiapine (SEROQUEL) 25 MG tablet Take 25 mg by mouth 3 (three)  times daily.   Yes Historical Provider, MD  Skin Protectants, Misc. (BAZA PROTECT EX) Apply 1 application topically daily as needed.   Yes Historical Provider, MD  sodium chloride 1 G tablet Take 1 g by mouth 2 (two) times daily.   Yes Historical Provider, MD  sulfamethoxazole-trimethoprim (BACTRIM,SEPTRA) 400-80 MG tablet Take 1 tablet by mouth daily.   Yes Historical Provider, MD  tamsulosin (FLOMAX) 0.4 MG CAPS capsule Take 0.4 mg by mouth daily after breakfast.   Yes Historical Provider, MD  trihexyphenidyl (ARTANE) 5 MG tablet Take 5 mg by mouth 2 (two) times daily.   Yes Historical Provider, MD   BP 122/81 mmHg  Pulse 71  Temp(Src) 98.3 F (36.8 C) (Oral)  Resp 15  Ht 6' (1.829 m)  Wt 228 lb (103.42 kg)  BMI 30.92 kg/m2  SpO2 100% Physical Exam CONSTITUTIONAL: Well developed/well nourished HEAD: Normocephalic/atraumatic EYES: EOMI/PERRL ENMT: Mucous membranes moist, tardive dyskinesia NECK: supple no meningeal signs CV: S1/S2 noted LUNGS: Lungs are clear to auscultation bilaterally, no apparent distress ABDOMEN: soft, nontender, no rebound or guarding, bowel sounds noted throughout abdomen GU:no cva tenderness NEURO: Pt is awake/alert moves all extremitiesx4.  No facial droop. No ataxia noted.  EXTREMITIES: pulses normal/equal, full ROM SKIN: warm, color normal PSYCH: no abnormalities of mood noted, alert and oriented to situation  ED Course  Procedures  DIAGNOSTIC STUDIES: Oxygen Saturation is 100% on RA, normal by my interpretation.    COORDINATION OF CARE: 10:24 PM - Discussed urinalysis results. Discussed plans to tst blood glucose levels and to prescribe antibiotics to be filled if pain persists. Pt advised of plan for treatment and pt agrees.  Per caregiver, pt currently at baseline He is in no distress He is ambulatory They report this always occurs with UTI even on nitrofurantoin daily Will give Rx for keflex and if dysuria/foul smelling urine continues will  start keflex.  Urine culture sent Caregiver reports he is at baseline and does not request any other testing BP 122/81 mmHg  Pulse 71  Temp(Src) 98.3 F (36.8 C) (Oral)  Resp 15  Ht 6' (1.829 m)  Wt 228 lb (103.42 kg)  BMI 30.92 kg/m2  SpO2 100% Labs Review Labs Reviewed  URINALYSIS, ROUTINE W REFLEX MICROSCOPIC (NOT AT [facility]) - Abnormal; Notable for the following:    Specific Gravity, Urine <1.005 (*)    Glucose, UA 250 (*)    All other components within normal limits  CBG MONITORING, ED - Abnormal; Notable for the following:    Glucose-Capillary 139 (*)    All other components within normal limits  URINE CULTURE    I have personally reviewed and evaluated these lab results as part of my medical decision-making.   MDM   Final diagnoses:  Dysuria    Nursing notes including past medical history and social history reviewed and considered in documentation Labs/vital reviewed myself and considered during evaluation   I personally performed the services described in this documentation, which was scribed  in my presence. The recorded information has been reviewed and is accurate.       Scott Fraise, MD 03/29/15 [phone]

## 2015-03-31 LAB — URINE CULTURE: Culture: 6000

## 2016-01-03 ENCOUNTER — Encounter: Payer: Self-pay | Admitting: *Deleted

## 2016-01-10 ENCOUNTER — Ambulatory Visit
Admission: RE | Admit: 2016-01-10 | Discharge: 2016-01-10 | Disposition: A | Discharge status: Home / Self Care | Payer: Medicare Other | Source: Ambulatory Visit | Attending: Ophthalmology | Admitting: Ophthalmology

## 2016-01-10 ENCOUNTER — Ambulatory Visit: Payer: Medicare Other | Admitting: Anesthesiology

## 2016-01-10 ENCOUNTER — Encounter: Payer: Self-pay | Admitting: *Deleted

## 2016-01-10 ENCOUNTER — Encounter
Admission: RE | Disposition: A | Discharge status: Home / Self Care | Payer: Self-pay | Source: Ambulatory Visit | Attending: Ophthalmology

## 2016-01-10 DIAGNOSIS — G40909 Epilepsy, unspecified, not intractable, without status epilepticus: Secondary | ICD-10-CM | POA: Insufficient documentation

## 2016-01-10 DIAGNOSIS — I10 Essential (primary) hypertension: Secondary | ICD-10-CM | POA: Insufficient documentation

## 2016-01-10 DIAGNOSIS — H2512 Age-related nuclear cataract, left eye: Secondary | ICD-10-CM | POA: Insufficient documentation

## 2016-01-10 DIAGNOSIS — F17200 Nicotine dependence, unspecified, uncomplicated: Secondary | ICD-10-CM | POA: Insufficient documentation

## 2016-01-10 DIAGNOSIS — F419 Anxiety disorder, unspecified: Secondary | ICD-10-CM | POA: Diagnosis not present

## 2016-01-10 DIAGNOSIS — K219 Gastro-esophageal reflux disease without esophagitis: Secondary | ICD-10-CM | POA: Diagnosis not present

## 2016-01-10 DIAGNOSIS — F209 Schizophrenia, unspecified: Secondary | ICD-10-CM | POA: Insufficient documentation

## 2016-01-10 HISTORY — PX: CATARACT EXTRACTION W/PHACO: SHX586

## 2016-01-10 HISTORY — DX: Syncope and collapse: R55

## 2016-01-10 LAB — GLUCOSE, CAPILLARY: GLUCOSE-CAPILLARY: 135 mg/dL — AB (ref 65–99)

## 2016-01-10 SURGERY — PHACOEMULSIFICATION, CATARACT, WITH IOL INSERTION
Anesthesia: Monitor Anesthesia Care | Site: Eye | Laterality: Left | Wound class: Clean

## 2016-01-10 MED ORDER — EPINEPHRINE HCL 1 MG/ML IJ SOLN
INTRAMUSCULAR | Status: AC
  Filled 2016-01-10: qty 2

## 2016-01-10 MED ORDER — PHENYLEPHRINE HCL 10 % OP SOLN
OPHTHALMIC | Status: AC
  Filled 2016-01-10: qty 5

## 2016-01-10 MED ORDER — SODIUM CHLORIDE 0.9 % IV SOLN
INTRAVENOUS | Status: DC
  Administered 2016-01-10: 07:00:00 via INTRAVENOUS

## 2016-01-10 MED ORDER — CEFUROXIME OPHTHALMIC INJECTION 1 MG/0.1 ML
INJECTION | OPHTHALMIC | Status: DC | PRN
  Administered 2016-01-10: .1 mL via INTRACAMERAL

## 2016-01-10 MED ORDER — MIDAZOLAM HCL 2 MG/2ML IJ SOLN
INTRAMUSCULAR | Status: DC | PRN
  Administered 2016-01-10: 1 mg via INTRAVENOUS

## 2016-01-10 MED ORDER — GLYCOPYRROLATE 0.2 MG/ML IJ SOLN
INTRAMUSCULAR | Status: DC | PRN
  Administered 2016-01-10: 0.2 mg via INTRAVENOUS

## 2016-01-10 MED ORDER — ARMC OPHTHALMIC DILATING GEL
1.0000 "application " | OPHTHALMIC | Status: DC | PRN
  Administered 2016-01-10: 1 via OPHTHALMIC

## 2016-01-10 MED ORDER — MOXIFLOXACIN HCL 0.5 % OP SOLN
OPHTHALMIC | Status: AC
  Filled 2016-01-10: qty 3

## 2016-01-10 MED ORDER — LIDOCAINE HCL (PF) 4 % IJ SOLN
INTRAOCULAR | Status: DC | PRN
  Administered 2016-01-10: .5 mL via OPHTHALMIC

## 2016-01-10 MED ORDER — EPINEPHRINE HCL 1 MG/ML IJ SOLN
INTRAOCULAR | Status: DC | PRN
  Administered 2016-01-10: 1 mL via OPHTHALMIC

## 2016-01-10 MED ORDER — ARMC OPHTHALMIC DILATING GEL
OPHTHALMIC | Status: AC
  Filled 2016-01-10: qty 0.25

## 2016-01-10 MED ORDER — POVIDONE-IODINE 5 % OP SOLN
1.0000 "application " | Freq: Once | OPHTHALMIC | Status: AC
  Administered 2016-01-10: 1 via OPHTHALMIC

## 2016-01-10 MED ORDER — PHENYLEPHRINE HCL 10 MG/ML IJ SOLN
INTRAMUSCULAR | Status: DC | PRN
  Administered 2016-01-10 (×2): 50 ug via INTRAVENOUS

## 2016-01-10 MED ORDER — CEFUROXIME OPHTHALMIC INJECTION 1 MG/0.1 ML
INJECTION | OPHTHALMIC | Status: AC
  Filled 2016-01-10: qty 0.1

## 2016-01-10 MED ORDER — CARBACHOL 0.01 % IO SOLN
INTRAOCULAR | Status: DC | PRN
  Administered 2016-01-10: .5 mL via INTRAOCULAR

## 2016-01-10 MED ORDER — TETRACAINE HCL 0.5 % OP SOLN
OPHTHALMIC | Status: AC
  Filled 2016-01-10: qty 2

## 2016-01-10 MED ORDER — NA CHONDROIT SULF-NA HYALURON 40-17 MG/ML IO SOLN
INTRAOCULAR | Status: AC
  Filled 2016-01-10: qty 1

## 2016-01-10 MED ORDER — FENTANYL CITRATE (PF) 100 MCG/2ML IJ SOLN
INTRAMUSCULAR | Status: DC | PRN
  Administered 2016-01-10 (×2): 25 ug via INTRAVENOUS

## 2016-01-10 MED ORDER — NA CHONDROIT SULF-NA HYALURON 40-17 MG/ML IO SOLN
INTRAOCULAR | Status: DC | PRN
  Administered 2016-01-10: 1 mL via INTRAOCULAR

## 2016-01-10 MED ORDER — MOXIFLOXACIN HCL 0.5 % OP SOLN: 1.0000 [drp] | OPHTHALMIC | Status: DC | PRN

## 2016-01-10 MED ORDER — MOXIFLOXACIN HCL 0.5 % OP SOLN
OPHTHALMIC | Status: DC | PRN
  Administered 2016-01-10: 1 [drp] via OPHTHALMIC

## 2016-01-10 MED ORDER — LIDOCAINE HCL (PF) 4 % IJ SOLN
INTRAMUSCULAR | Status: AC
  Filled 2016-01-10: qty 5

## 2016-01-10 MED ORDER — PROPOFOL 10 MG/ML IV BOLUS
INTRAVENOUS | Status: DC | PRN
  Administered 2016-01-10: 100 mg via INTRAVENOUS

## 2016-01-10 MED ORDER — TETRACAINE HCL 0.5 % OP SOLN
1.0000 [drp] | Freq: Once | OPHTHALMIC | Status: AC
  Administered 2016-01-10: 1 [drp] via OPHTHALMIC

## 2016-01-10 MED ORDER — LIDOCAINE HCL (CARDIAC) 20 MG/ML IV SOLN
INTRAVENOUS | Status: DC | PRN
  Administered 2016-01-10: 100 mg via INTRAVENOUS

## 2016-01-10 MED ORDER — POVIDONE-IODINE 5 % OP SOLN
OPHTHALMIC | Status: AC
  Filled 2016-01-10: qty 30

## 2016-01-10 MED ORDER — ONDANSETRON HCL 4 MG/2ML IJ SOLN
INTRAMUSCULAR | Status: DC | PRN
Start: 2016-01-10 — End: 2016-01-10
  Administered 2016-01-10: 4 mg via INTRAVENOUS

## 2016-01-10 SURGICAL SUPPLY — 21 items
CANNULA ANT/CHMB 27GA (MISCELLANEOUS) ×3 IMPLANT
CUP MEDICINE 2OZ PLAST GRAD ST (MISCELLANEOUS) ×3 IMPLANT
GLOVE BIO SURGEON STRL SZ8 (GLOVE) ×3 IMPLANT
GLOVE BIOGEL M 6.5 STRL (GLOVE) ×3 IMPLANT
GLOVE SURG LX 8.0 MICRO (GLOVE) ×2
GLOVE SURG LX STRL 8.0 MICRO (GLOVE) ×1 IMPLANT
GOWN STRL REUS W/ TWL LRG LVL3 (GOWN DISPOSABLE) ×2 IMPLANT
GOWN STRL REUS W/TWL LRG LVL3 (GOWN DISPOSABLE) ×4
LENS IOL TECNIS ITEC 21.5 (Intraocular Lens) ×3 IMPLANT
PACK CATARACT (MISCELLANEOUS) ×3 IMPLANT
PACK CATARACT BRASINGTON LX (MISCELLANEOUS) ×3 IMPLANT
PACK EYE AFTER SURG (MISCELLANEOUS) ×3 IMPLANT
SOL BSS BAG (MISCELLANEOUS) ×3
SOL PREP PVP 2OZ (MISCELLANEOUS) ×3
SOLUTION BSS BAG (MISCELLANEOUS) ×1 IMPLANT
SOLUTION PREP PVP 2OZ (MISCELLANEOUS) ×1 IMPLANT
SYR 3ML LL SCALE MARK (SYRINGE) ×3 IMPLANT
SYR 5ML LL (SYRINGE) ×3 IMPLANT
SYR TB 1ML 27GX1/2 LL (SYRINGE) ×3 IMPLANT
WATER STERILE IRR 1000ML POUR (IV SOLUTION) ×3 IMPLANT
WIPE NON LINTING 3.25X3.25 (MISCELLANEOUS) ×3 IMPLANT

## 2016-01-10 NOTE — Op Note (Signed)
PREOPERATIVE DIAGNOSIS:  Nuclear sclerotic cataract of the left eye.   POSTOPERATIVE DIAGNOSIS:  nuclear sclerotic cataract left eye   OPERATIVE PROCEDURE:  Procedure(s): CATARACT EXTRACTION PHACO AND INTRAOCULAR LENS PLACEMENT (IOC)   SURGEON:  Birder Robson, MD.   ANESTHESIA:   Anesthesiologist: Gunnar Bulla, MD CRNA: Demetrius Charity, CRNA  1.      Managed anesthesia care. 2.      Topical tetracaine drops followed by 2% Xylocaine jelly applied in the preoperative holding area.        3.   0.2 ml of epi-Shugarcaine was  placed in the anterior chamber following the paracentesis.    COMPLICATIONS:  None.   TECHNIQUE:   Stop and chop   DESCRIPTION OF PROCEDURE:  The patient was examined and consented in the preoperative holding area where the aforementioned topical anesthesia was applied to the left eye and then brought back to the Operating Room where the left eye was prepped and draped in the usual sterile ophthalmic fashion and a lid speculum was placed. A paracentesis was created with the side port blade and the anterior chamber was filled with viscoelastic. A near clear corneal incision was performed with the steel keratome. A continuous curvilinear capsulorrhexis was performed with a cystotome followed by the capsulorrhexis forceps. Hydrodissection and hydrodelineation were carried out with BSS on a blunt cannula. The lens was removed in a stop and chop  technique and the remaining cortical material was removed with the irrigation-aspiration handpiece. The capsular bag was inflated with viscoelastic and the Technis [id number] lens was placed in the capsular bag without complication. The remaining viscoelastic was removed from the eye with the irrigation-aspiration handpiece. The wounds were hydrated. The anterior chamber was flushed with Miostat and the eye was inflated to physiologic pressure. 0.1 mL of cefuroxime concentration 10 mg/mL was placed in the anterior chamber. The wounds were  found to be water tight. The eye was dressed with Vigamox. The patient was given protective glasses to wear throughout the day and a shield with which to sleep tonight. The patient was also given drops with which to begin a drop regimen today and will follow-up with me in one day.  Implant Name Type Inv. Item Serial No. Manufacturer Lot No. LRB No. Used  LENS IOL DIOP 21.5 - [id number] Intraocular Lens LENS IOL DIOP 21.5 [id number]   Left 1   Procedure(s) with comments: CATARACT EXTRACTION PHACO AND INTRAOCULAR LENS PLACEMENT (IOC) (Left) - [place] 00:53 AP% 24.2 CDE 12.88 fluid pack lot # [id number]  Electronically signed: [place] 01/10/2016 9:01 AM

## 2016-01-10 NOTE — Discharge Instructions (Signed)
Eye Surgery Discharge Instructions  Expect mild scratchy sensation or mild soreness. DO NOT RUB YOUR EYE!  The day of surgery:  Minimal physical activity, but bed rest is not required  No reading, computer work, or close hand work  No bending, lifting, or straining.  May watch TV  For 24 hours:  No driving, legal decisions, or alcoholic beverages  Safety precautions  Eat anything you prefer: It is better to start with liquids, then soup then solid foods.  _____ Eye patch should be worn until postoperative exam tomorrow.  ____ Solar shield eyeglasses should be worn for comfort in the sunlight/patch while sleeping  Resume all regular medications including aspirin or Coumadin if these were discontinued prior to surgery. You may shower, bathe, shave, or wash your hair. Tylenol may be taken for mild discomfort.  Call your doctor if you experience significant pain, nausea, or vomiting, fever > 101 or other signs of infection. [phone] or [phone] Specific instructions:  Follow-up Information    PORFILIO,WILLIAM LOUIS, MD Follow up on 01/11/2016.   Specialty:  Ophthalmology Why:  10:00 Contact information: [address] [place] [address] [phone]

## 2016-01-10 NOTE — H&P (Signed)
  All labs reviewed. Abnormal studies sent to patients PCP when indicated.  Previous H&P reviewed, patient examined, there are NO CHANGES.  Scott [name] LOUIS8/8/20178:03 AM

## 2016-01-10 NOTE — Transfer of Care (Signed)
Immediate Anesthesia Transfer of Care Note  Patient: Scott [name]  Procedure(s) Performed: Procedure(s) with comments: CATARACT EXTRACTION PHACO AND INTRAOCULAR LENS PLACEMENT (IOC) (Left) - [place] 00:53 AP% 24.2 CDE 12.88 fluid pack lot # [id number]  Patient Location: PACU  Anesthesia Type:General  Level of Consciousness: sedated  Airway & Oxygen Therapy: Patient Spontanous Breathing and Patient connected to face mask oxygen  Post-op Assessment: Report given to RN and Post -op Vital signs reviewed and stable  Post vital signs: Reviewed and stable  Last Vitals:  Vitals:   01/10/16 0719  BP: (!) 135/94  Pulse: 69  Resp: 16  Temp: 36.7 C    Last Pain:  Vitals:   01/10/16 0719  TempSrc: Oral         Complications: No apparent anesthesia complications

## 2016-01-10 NOTE — Anesthesia Procedure Notes (Signed)
Procedure Name: LMA Insertion Performed by: Husain Costabile Pre-anesthesia Checklist: Patient identified, Patient being monitored, Timeout performed, Emergency Drugs available and Suction available Patient Re-evaluated:Patient Re-evaluated prior to inductionOxygen Delivery Method: Circle system utilized Preoxygenation: Pre-oxygenation with 100% oxygen Intubation Type: IV induction Ventilation: Mask ventilation without difficulty LMA: LMA inserted LMA Size: 5.0 Tube type: Oral Number of attempts: 1 Placement Confirmation: positive ETCO2 and breath sounds checked- equal and bilateral Tube secured with: Tape Dental Injury: Teeth and Oropharynx as per pre-operative assessment      

## 2016-01-10 NOTE — Anesthesia Postprocedure Evaluation (Signed)
Anesthesia Post Note  Patient: Scott [name]  Procedure(s) Performed: Procedure(s) (LRB): CATARACT EXTRACTION PHACO AND INTRAOCULAR LENS PLACEMENT (IOC) (Left)  Patient location during evaluation: PACU Anesthesia Type: General Level of consciousness: awake and alert Pain management: pain level controlled Vital Signs Assessment: post-procedure vital signs reviewed and stable Respiratory status: spontaneous breathing, nonlabored ventilation, respiratory function stable and patient connected to nasal cannula oxygen Cardiovascular status: blood pressure returned to baseline and stable Postop Assessment: no signs of nausea or vomiting Anesthetic complications: no    Last Vitals:  Vitals:   01/10/16 0930 01/10/16 0953  BP: 116/82 124/84  Pulse:  100  Resp:  16  Temp: 36.2 C 36.2 C    Last Pain:  Vitals:   01/10/16 0953  TempSrc: Tympanic  PainSc:                  Zlata Alcaide S

## 2016-01-10 NOTE — Anesthesia Preprocedure Evaluation (Signed)
Anesthesia Evaluation  Patient identified by MRN, date of birth, ID band Patient awake    Reviewed: Allergy & Precautions, NPO status , Patient's Chart, lab work & pertinent test results, reviewed documented beta blocker date and time   Airway Mallampati: II  TM Distance: >3 FB     Dental  (+) Chipped   Pulmonary Current Smoker,           Cardiovascular hypertension, Pt. on home beta blockers and Pt. on medications      Neuro/Psych Seizures -,  PSYCHIATRIC DISORDERS Anxiety Schizophrenia    GI/Hepatic GERD  Controlled,  Endo/Other    Renal/GU      Musculoskeletal   Abdominal   Peds  Hematology   Anesthesia Other Findings   Reproductive/Obstetrics                             Anesthesia Physical Anesthesia Plan  ASA: III  Anesthesia Plan: MAC   Post-op Pain Management:    Induction:   Airway Management Planned:   Additional Equipment:   Intra-op Plan:   Post-operative Plan:   Informed Consent: I have reviewed the patients History and Physical, chart, labs and discussed the procedure including the risks, benefits and alternatives for the proposed anesthesia with the patient or authorized representative who has indicated his/her understanding and acceptance.     Plan Discussed with: CRNA  Anesthesia Plan Comments:         Anesthesia Quick Evaluation

## 2016-01-11 ENCOUNTER — Encounter: Payer: Self-pay | Admitting: Ophthalmology

## 2016-01-21 ENCOUNTER — Emergency Department (HOSPITAL_COMMUNITY)
Admission: EM | Admit: 2016-01-21 | Discharge: 2016-01-21 | Disposition: A | Discharge status: Home / Self Care | Payer: Medicare Other | Attending: Emergency Medicine | Admitting: Emergency Medicine

## 2016-01-21 ENCOUNTER — Encounter (HOSPITAL_COMMUNITY): Payer: Self-pay | Admitting: *Deleted

## 2016-01-21 DIAGNOSIS — I10 Essential (primary) hypertension: Secondary | ICD-10-CM | POA: Insufficient documentation

## 2016-01-21 DIAGNOSIS — R300 Dysuria: Secondary | ICD-10-CM | POA: Insufficient documentation

## 2016-01-21 DIAGNOSIS — F17210 Nicotine dependence, cigarettes, uncomplicated: Secondary | ICD-10-CM | POA: Insufficient documentation

## 2016-01-21 DIAGNOSIS — E871 Hypo-osmolality and hyponatremia: Secondary | ICD-10-CM | POA: Insufficient documentation

## 2016-01-21 DIAGNOSIS — Z79899 Other long term (current) drug therapy: Secondary | ICD-10-CM | POA: Diagnosis not present

## 2016-01-21 DIAGNOSIS — Z7984 Long term (current) use of oral hypoglycemic drugs: Secondary | ICD-10-CM | POA: Insufficient documentation

## 2016-01-21 HISTORY — DX: Hypo-osmolality and hyponatremia: E87.1

## 2016-01-21 LAB — CBC WITH DIFFERENTIAL/PLATELET
Basophils Absolute: 0 10*3/uL (ref 0.0–0.1)
Basophils Relative: 0 %
Eosinophils Absolute: 0 10*3/uL (ref 0.0–0.7)
Eosinophils Relative: 1 %
HEMATOCRIT: 37.6 % — AB (ref 39.0–52.0)
HEMOGLOBIN: 12.8 g/dL — AB (ref 13.0–17.0)
LYMPHS ABS: 1.9 10*3/uL (ref 0.7–4.0)
LYMPHS PCT: 37 %
MCH: 30.3 pg (ref 26.0–34.0)
MCHC: 34 g/dL (ref 30.0–36.0)
MCV: 88.9 fL (ref 78.0–100.0)
MONOS PCT: 12 %
Monocytes Absolute: 0.6 10*3/uL (ref 0.1–1.0)
NEUTROS ABS: 2.6 10*3/uL (ref 1.7–7.7)
NEUTROS PCT: 50 %
Platelets: 213 10*3/uL (ref 150–400)
RBC: 4.23 MIL/uL (ref 4.22–5.81)
RDW: 14.1 % (ref 11.5–15.5)
WBC: 5.1 10*3/uL (ref 4.0–10.5)

## 2016-01-21 LAB — COMPREHENSIVE METABOLIC PANEL
ALT: 15 U/L — AB (ref 17–63)
ANION GAP: 5 (ref 5–15)
AST: 21 U/L (ref 15–41)
Albumin: 4 g/dL (ref 3.5–5.0)
Alkaline Phosphatase: 55 U/L (ref 38–126)
BUN: 13 mg/dL (ref 6–20)
CHLORIDE: 96 mmol/L — AB (ref 101–111)
CO2: 24 mmol/L (ref 22–32)
CREATININE: 0.92 mg/dL (ref 0.61–1.24)
Calcium: 9.3 mg/dL (ref 8.9–10.3)
GFR calc non Af Amer: >60 mL/min (ref >60)
Glucose, Bld: 129 mg/dL — ABNORMAL HIGH (ref 65–99)
POTASSIUM: 4.5 mmol/L (ref 3.5–5.1)
SODIUM: 125 mmol/L — AB (ref 135–145)
Total Bilirubin: 0.9 mg/dL (ref 0.3–1.2)
Total Protein: 7.6 g/dL (ref 6.5–8.1)

## 2016-01-21 LAB — URINALYSIS, ROUTINE W REFLEX MICROSCOPIC
Bilirubin Urine: NEGATIVE
Glucose, UA: 100 mg/dL — AB
HGB URINE DIPSTICK: NEGATIVE
KETONES UR: NEGATIVE mg/dL
Leukocytes, UA: NEGATIVE
Nitrite: NEGATIVE
PROTEIN: NEGATIVE mg/dL
Specific Gravity, Urine: 1.01 (ref 1.005–1.030)
pH: 6.5 (ref 5.0–8.0)

## 2016-01-21 NOTE — ED Notes (Signed)
Pt states he needs some water to drink to be able to give a urine sample. Pt made aware he needs to see the doctor first.

## 2016-01-21 NOTE — ED Provider Notes (Signed)
[facility] Provider Note   CSN: [id number] Arrival date & time: 01/21/16  [id number]     History   Chief Complaint Chief Complaint  Patient presents with  . Dysuria    HPI Scott [name] is a 65 y.o. male.  The history is provided by the patient and a caregiver. The history is limited by the condition of the patient (Hx dementia).  Dysuria    Pt was seen at 0900. Per pt's [facility] staff: Pt with hx recurrent UTI and "is acting like he has another one." For the past several days, pt's urine has been "dark" and he has been c/o "burning" when he urinates. Pt also "won't sleep well" and "will act out" when he has a UTI, per Staff. Pt himself has hx of dementia and schizophrenia and current only c/o "wanting something to drink." Denies abd pain, no N/V/D, no fevers, no back pain, no testicular pain/swelling, no hematuria.    Past Medical History:  Diagnosis Date  . Anxiety   . Chronic hyponatremia 05/2013, 11/2013  . Dementia   . Dyslipidemia   . GERD (gastroesophageal reflux disease)   . Hypertension   . Hypoglycemia   . Schizophrenia ([place])   . Seizures ([place])   . Syncope   . Tardive dyskinesia   . Urinary tract infection   . Vitamin D deficiency     There are no active problems to display for this patient.   Past Surgical History:  Procedure Laterality Date  . CATARACT EXTRACTION W/PHACO Left 01/10/2016   Procedure: CATARACT EXTRACTION PHACO AND INTRAOCULAR LENS PLACEMENT (IOC);  Surgeon: Birder Robson, MD;  Location: [facility] ORS;  Service: Ophthalmology;  Laterality: Left;  [place] 00:53 AP% 24.2 CDE 12.88 fluid pack lot # [id number]  . EYE SURGERY    . FOOT SURGERY         Home Medications    Prior to Admission medications   Medication Sig Start Date End Date Taking? Authorizing Provider  acetaminophen (TYLENOL) 500 MG tablet Take 500 mg by mouth every 6 (six) hours as needed.   Yes Historical Provider, MD  amLODipine (NORVASC) 10 MG tablet Take 10 mg by  mouth daily.   Yes Historical Provider, MD  atorvastatin (LIPITOR) 20 MG tablet Take 20 mg by mouth daily.   Yes Historical Provider, MD  Bromfenac Sodium (BROMSITE) 0.075 % SOLN Place 1 drop into the right eye 2 (two) times daily.   Yes Historical Provider, MD  cetirizine (ZYRTEC) 10 MG tablet Take 10 mg by mouth daily.   Yes Historical Provider, MD  Difluprednate (DUREZOL) 0.05 % EMUL Place 1 drop into the right eye 2 (two) times daily.   Yes Historical Provider, MD  divalproex (DEPAKOTE) 500 MG DR tablet Take 2,000 mg by mouth at bedtime.   Yes Historical Provider, MD  docusate sodium (COLACE) 100 MG capsule Take 100 mg by mouth 2 (two) times daily.   Yes Historical Provider, MD  metFORMIN (GLUCOPHAGE) 500 MG tablet Take by mouth 2 (two) times daily with a meal.   Yes Historical Provider, MD  metoprolol succinate (TOPROL-XL) 50 MG 24 hr tablet Take 50 mg by mouth 2 (two) times daily. Take with or immediately following a meal.    Yes Historical Provider, MD  mirtazapine (REMERON) 15 MG tablet Take 15 mg by mouth at bedtime.   Yes Historical Provider, MD  pantoprazole (PROTONIX) 40 MG tablet Take 40 mg by mouth 2 (two) times daily.   Yes Historical Provider,  MD  QUEtiapine (SEROQUEL) 200 MG tablet Take 200 mg by mouth at bedtime.   Yes Historical Provider, MD  QUEtiapine (SEROQUEL) 25 MG tablet Take 25 mg by mouth 3 (three) times daily. Care giver states he also takes Seroquel 200mg  at bedtime   Yes Historical Provider, MD  Skin Protectants, Misc. (BAZA PROTECT EX) Apply 1 application topically daily as needed.   Yes Historical Provider, MD  sodium chloride 1 G tablet Take 1 g by mouth 4 (four) times daily.    Yes Historical Provider, MD  tamsulosin (FLOMAX) 0.4 MG CAPS capsule Take 0.4 mg by mouth daily after breakfast.   Yes Historical Provider, MD  trihexyphenidyl (ARTANE) 5 MG tablet Take 5 mg by mouth 2 (two) times daily.   Yes Historical Provider, MD    Family History No family history on  file.  Social History Social History  Substance Use Topics  . Smoking status: Current Every Day Smoker    Packs/day: 0.50    Types: Cigarettes  . Smokeless tobacco: Never Used  . Alcohol use No     Allergies   Review of patient's allergies indicates no known allergies.   Review of Systems Review of Systems  Unable to perform ROS: Dementia  Genitourinary: Positive for dysuria.     Physical Exam Updated Vital Signs BP 127/79 (BP Location: Left Arm)   Pulse 68   Temp 97.6 F (36.4 C) (Oral)   Resp 18   Ht 6\' 2"  (1.88 m)   Wt 239 lb (108.4 kg)   SpO2 98%   BMI 30.69 kg/m   Physical Exam 0905: Physical examination:  Nursing notes reviewed; Vital signs and O2 SAT reviewed;  Constitutional: Well developed, Well nourished, Well hydrated, In no acute distress; Head:  Normocephalic, atraumatic; Eyes: EOMI, PERRL, No scleral icterus; ENMT: Mouth and pharynx normal, Mucous membranes moist; Neck: Supple, Full range of motion, No lymphadenopathy; Cardiovascular: Regular rate and rhythm, No gallop; Respiratory: Breath sounds clear & equal bilaterally, No wheezes.  Speaking full sentences with ease, Normal respiratory effort/excursion; Chest: Nontender, Movement normal; Abdomen: Soft, Nontender, Nondistended, Normal bowel sounds; Genitourinary: No CVA tenderness; Extremities: Pulses normal, No tenderness, No edema, No calf edema or asymmetry.; Neuro: Awake, alert, confused per hx dementia. No facial droop. Speech clear. No gross focal motor deficits in extremities. Climbs on and off stretcher easily by himself. Gait steady.; Skin: Color normal, Warm, Dry.; Psych:  Calm, cooperative.     ED Treatments / Results  Labs (all labs ordered are listed, but only abnormal results are displayed)   EKG  EKG Interpretation None       Radiology   Procedures Procedures (including critical care time)  Medications Ordered in ED Medications - No data to display   Initial Impression /  Assessment and Plan / ED Course  I have reviewed the triage vital signs and the nursing notes.  Pertinent labs & imaging results that were available during my care of the patient were reviewed by me and considered in my medical decision making (see chart for details).   MDM Reviewed: previous chart, nursing note and vitals Reviewed previous: labs Interpretation: labs   Results for orders placed or performed during the hospital encounter of 01/21/16  Urinalysis, Routine w reflex microscopic (not at [facility])  Result Value Ref Range   Color, Urine YELLOW YELLOW   APPearance CLEAR CLEAR   Specific Gravity, Urine 1.010 1.005 - 1.030   pH 6.5 5.0 - 8.0   Glucose, UA 100 (  A) NEGATIVE mg/dL   Hgb urine dipstick NEGATIVE NEGATIVE   Bilirubin Urine NEGATIVE NEGATIVE   Ketones, ur NEGATIVE NEGATIVE mg/dL   Protein, ur NEGATIVE NEGATIVE mg/dL   Nitrite NEGATIVE NEGATIVE   Leukocytes, UA NEGATIVE NEGATIVE  Comprehensive metabolic panel  Result Value Ref Range   Sodium 125 (L) 135 - 145 mmol/L   Potassium 4.5 3.5 - 5.1 mmol/L   Chloride 96 (L) 101 - 111 mmol/L   CO2 24 22 - 32 mmol/L   Glucose, Bld 129 (H) 65 - 99 mg/dL   BUN 13 6 - 20 mg/dL   Creatinine, Ser 0.92 0.61 - 1.24 mg/dL   Calcium 9.3 8.9 - 10.3 mg/dL   Total Protein 7.6 6.5 - 8.1 g/dL   Albumin 4.0 3.5 - 5.0 g/dL   AST 21 15 - 41 U/L   ALT 15 (L) 17 - 63 U/L   Alkaline Phosphatase 55 38 - 126 U/L   Total Bilirubin 0.9 0.3 - 1.2 mg/dL   GFR calc non Af Amer >60 >60 mL/min   GFR calc Af Amer >60 >60 mL/min   Anion gap 5 5 - 15  CBC with Differential  Result Value Ref Range   WBC 5.1 4.0 - 10.5 K/uL   RBC 4.23 4.22 - 5.81 MIL/uL   Hemoglobin 12.8 (L) 13.0 - 17.0 g/dL   HCT 37.6 (L) 39.0 - 52.0 %   MCV 88.9 78.0 - 100.0 fL   MCH 30.3 26.0 - 34.0 pg   MCHC 34.0 30.0 - 36.0 g/dL   RDW 14.1 11.5 - 15.5 %   Platelets 213 150 - 400 K/uL   Neutrophils Relative % 50 %   Neutro Abs 2.6 1.7 - 7.7 K/uL   Lymphocytes Relative 37  %   Lymphs Abs 1.9 0.7 - 4.0 K/uL   Monocytes Relative 12 %   Monocytes Absolute 0.6 0.1 - 1.0 K/uL   Eosinophils Relative 1 %   Eosinophils Absolute 0.0 0.0 - 0.7 K/uL   Basophils Relative 0 %   Basophils Absolute 0.0 0.0 - 0.1 K/uL     1230:  Chronic hyponatremia dating back to 2014, per EPIC chart review. Pt has tol PO well while in the ED without N/V. Has been ambulatory around the ED with steady gait, resps easy, NAD; waving at ED staff and smiling. Pt remains at his baseline per Group Home staff at bedside. Staff would like to take him home and pt wants to leave the ED now.  Dx and testing d/w pt and Staff.   Questions answered.  Verb understanding, agreeable to d/c group home with outpt f/u.     Final Clinical Impressions(s) / ED Diagnoses   Final diagnoses:  None    New Prescriptions New Prescriptions   No medications on file     Francine Graven, DO 01/24/16 1513

## 2016-01-21 NOTE — ED Triage Notes (Signed)
Pt comes in for dysuria and inability to sleep the last several days. Pt denies any other symptoms. States his urine is dark in color.

## 2016-01-21 NOTE — Discharge Instructions (Signed)
Take your usual prescriptions as previously directed.  Call your regular medical doctor on Monday to schedule a follow up appointment within the next 3 days to re-check your blood sodium level and possibly adjust your medications.  Return to the Emergency Department immediately sooner if worsening.

## 2016-01-21 NOTE — ED Notes (Signed)
Pt given water to drink per Dr. Thurnell Garbe.

## 2016-01-23 ENCOUNTER — Emergency Department
Admission: EM | Admit: 2016-01-23 | Discharge: 2016-01-25 | Disposition: A | Discharge status: Other Acute Inpatient Hospital | Payer: Medicare Other | Attending: Emergency Medicine | Admitting: Emergency Medicine

## 2016-01-23 DIAGNOSIS — F17210 Nicotine dependence, cigarettes, uncomplicated: Secondary | ICD-10-CM | POA: Diagnosis not present

## 2016-01-23 DIAGNOSIS — E871 Hypo-osmolality and hyponatremia: Secondary | ICD-10-CM

## 2016-01-23 DIAGNOSIS — F209 Schizophrenia, unspecified: Secondary | ICD-10-CM | POA: Diagnosis not present

## 2016-01-23 DIAGNOSIS — Z0289 Encounter for other administrative examinations: Secondary | ICD-10-CM | POA: Diagnosis present

## 2016-01-23 DIAGNOSIS — Z79899 Other long term (current) drug therapy: Secondary | ICD-10-CM | POA: Insufficient documentation

## 2016-01-23 DIAGNOSIS — I10 Essential (primary) hypertension: Secondary | ICD-10-CM

## 2016-01-23 DIAGNOSIS — F3010 Manic episode without psychotic symptoms, unspecified: Secondary | ICD-10-CM

## 2016-01-23 DIAGNOSIS — F0390 Unspecified dementia without behavioral disturbance: Secondary | ICD-10-CM | POA: Insufficient documentation

## 2016-01-23 DIAGNOSIS — F203 Undifferentiated schizophrenia: Secondary | ICD-10-CM

## 2016-01-23 DIAGNOSIS — F309 Manic episode, unspecified: Secondary | ICD-10-CM | POA: Diagnosis not present

## 2016-01-23 DIAGNOSIS — K219 Gastro-esophageal reflux disease without esophagitis: Secondary | ICD-10-CM

## 2016-01-23 DIAGNOSIS — F200 Paranoid schizophrenia: Secondary | ICD-10-CM | POA: Diagnosis not present

## 2016-01-23 DIAGNOSIS — Z7984 Long term (current) use of oral hypoglycemic drugs: Secondary | ICD-10-CM | POA: Insufficient documentation

## 2016-01-23 DIAGNOSIS — G2401 Drug induced subacute dyskinesia: Secondary | ICD-10-CM

## 2016-01-23 LAB — COMPREHENSIVE METABOLIC PANEL
ALT: 15 U/L — AB (ref 17–63)
ANION GAP: 6 (ref 5–15)
AST: 21 U/L (ref 15–41)
Albumin: 3.9 g/dL (ref 3.5–5.0)
Alkaline Phosphatase: 50 U/L (ref 38–126)
BUN: 13 mg/dL (ref 6–20)
CALCIUM: 9.3 mg/dL (ref 8.9–10.3)
CO2: 24 mmol/L (ref 22–32)
Chloride: 100 mmol/L — ABNORMAL LOW (ref 101–111)
Creatinine, Ser: 0.93 mg/dL (ref 0.61–1.24)
GFR calc non Af Amer: >60 mL/min (ref >60)
Glucose, Bld: 159 mg/dL — ABNORMAL HIGH (ref 65–99)
Potassium: 4.4 mmol/L (ref 3.5–5.1)
SODIUM: 130 mmol/L — AB (ref 135–145)
TOTAL PROTEIN: 7.4 g/dL (ref 6.5–8.1)
Total Bilirubin: 0.4 mg/dL (ref 0.3–1.2)

## 2016-01-23 LAB — URINE DRUG SCREEN, QUALITATIVE (ARMC ONLY)
Amphetamines, Ur Screen: NOT DETECTED
BARBITURATES, UR SCREEN: NOT DETECTED
BENZODIAZEPINE, UR SCRN: NOT DETECTED
Cannabinoid 50 Ng, Ur ~~LOC~~: NOT DETECTED
Cocaine Metabolite,Ur ~~LOC~~: NOT DETECTED
MDMA (Ecstasy)Ur Screen: NOT DETECTED
METHADONE SCREEN, URINE: NOT DETECTED
OPIATE, UR SCREEN: NOT DETECTED
Phencyclidine (PCP) Ur S: NOT DETECTED
TRICYCLIC, UR SCREEN: POSITIVE — AB

## 2016-01-23 LAB — CBC
HCT: 36.6 % — ABNORMAL LOW (ref 40.0–52.0)
Hemoglobin: 12 g/dL — ABNORMAL LOW (ref 13.0–18.0)
MCH: 30.6 pg (ref 26.0–34.0)
MCHC: 32.9 g/dL (ref 32.0–36.0)
MCV: 92.9 fL (ref 80.0–100.0)
PLATELETS: 197 10*3/uL (ref 150–440)
RBC: 3.94 MIL/uL — ABNORMAL LOW (ref 4.40–5.90)
RDW: 15.3 % — AB (ref 11.5–14.5)
WBC: 5.9 10*3/uL (ref 3.8–10.6)

## 2016-01-23 LAB — URINALYSIS COMPLETE WITH MICROSCOPIC (ARMC ONLY)
BILIRUBIN URINE: NEGATIVE
Bacteria, UA: NONE SEEN
Glucose, UA: 150 mg/dL — AB
KETONES UR: NEGATIVE mg/dL
LEUKOCYTES UA: NEGATIVE
NITRITE: NEGATIVE
PH: 5 (ref 5.0–8.0)
Protein, ur: NEGATIVE mg/dL
Specific Gravity, Urine: 1.015 (ref 1.005–1.030)

## 2016-01-23 LAB — URINE CULTURE: Culture: NO GROWTH

## 2016-01-23 LAB — ACETAMINOPHEN LEVEL

## 2016-01-23 LAB — VALPROIC ACID LEVEL: Valproic Acid Lvl: 99 ug/mL (ref 50.0–100.0)

## 2016-01-23 LAB — ETHANOL

## 2016-01-23 LAB — SALICYLATE LEVEL

## 2016-01-23 MED ORDER — DIFLUPREDNATE 0.05 % OP EMUL: 1.0000 [drp] | Freq: Two times a day (BID) | OPHTHALMIC | Status: DC

## 2016-01-23 MED ORDER — AMLODIPINE BESYLATE 5 MG PO TABS
10.0000 mg | ORAL_TABLET | Freq: Every day | ORAL | Status: DC
  Administered 2016-01-23 – 2016-01-25 (×3): 10 mg via ORAL
  Filled 2016-01-23 (×3): qty 2

## 2016-01-23 MED ORDER — PANTOPRAZOLE SODIUM 40 MG PO TBEC
40.0000 mg | DELAYED_RELEASE_TABLET | Freq: Two times a day (BID) | ORAL | Status: DC
  Administered 2016-01-23 – 2016-01-25 (×4): 40 mg via ORAL
  Filled 2016-01-23 (×4): qty 1

## 2016-01-23 MED ORDER — TAMSULOSIN HCL 0.4 MG PO CAPS
0.4000 mg | ORAL_CAPSULE | Freq: Every day | ORAL | Status: DC
  Administered 2016-01-24 – 2016-01-25 (×2): 0.4 mg via ORAL
  Filled 2016-01-23 (×2): qty 1

## 2016-01-23 MED ORDER — TRIHEXYPHENIDYL HCL 2 MG PO TABS
5.0000 mg | ORAL_TABLET | Freq: Two times a day (BID) | ORAL | Status: DC
  Administered 2016-01-24 – 2016-01-25 (×4): 5 mg via ORAL
  Filled 2016-01-23 (×2): qty 3
  Filled 2016-01-23: qty 1
  Filled 2016-01-23 (×3): qty 3
  Filled 2016-01-23: qty 1

## 2016-01-23 MED ORDER — QUETIAPINE FUMARATE 25 MG PO TABS
25.0000 mg | ORAL_TABLET | Freq: Three times a day (TID) | ORAL | Status: DC
  Administered 2016-01-23 – 2016-01-25 (×5): 25 mg via ORAL
  Filled 2016-01-23 (×5): qty 1

## 2016-01-23 MED ORDER — LORAZEPAM 1 MG PO TABS
1.0000 mg | ORAL_TABLET | Freq: Once | ORAL | Status: AC
  Administered 2016-01-23: 1 mg via ORAL
  Filled 2016-01-23: qty 1

## 2016-01-23 MED ORDER — QUETIAPINE FUMARATE 200 MG PO TABS
200.0000 mg | ORAL_TABLET | Freq: Every day | ORAL | Status: DC
  Administered 2016-01-23 – 2016-01-24 (×2): 200 mg via ORAL
  Filled 2016-01-23 (×2): qty 1

## 2016-01-23 MED ORDER — METFORMIN HCL 500 MG PO TABS
500.0000 mg | ORAL_TABLET | Freq: Two times a day (BID) | ORAL | Status: DC
  Administered 2016-01-24 – 2016-01-25 (×3): 500 mg via ORAL
  Filled 2016-01-23 (×3): qty 1

## 2016-01-23 MED ORDER — MIRTAZAPINE 15 MG PO TABS
15.0000 mg | ORAL_TABLET | Freq: Every day | ORAL | Status: DC
  Administered 2016-01-23 – 2016-01-24 (×2): 15 mg via ORAL
  Filled 2016-01-23 (×2): qty 1

## 2016-01-23 MED ORDER — BROMFENAC SODIUM 0.075 % OP SOLN: 1.0000 [drp] | Freq: Two times a day (BID) | OPHTHALMIC | Status: DC

## 2016-01-23 MED ORDER — DIVALPROEX SODIUM 500 MG PO DR TAB
2000.0000 mg | DELAYED_RELEASE_TABLET | Freq: Every day | ORAL | Status: DC
  Administered 2016-01-23 – 2016-01-24 (×2): 2000 mg via ORAL
  Filled 2016-01-23 (×3): qty 4

## 2016-01-23 MED ORDER — SODIUM CHLORIDE 1 G PO TABS
1.0000 g | ORAL_TABLET | Freq: Four times a day (QID) | ORAL | Status: DC
  Administered 2016-01-23 – 2016-01-25 (×5): 1 g via ORAL
  Filled 2016-01-23 (×11): qty 1

## 2016-01-23 MED ORDER — ATORVASTATIN CALCIUM 20 MG PO TABS
20.0000 mg | ORAL_TABLET | Freq: Every day | ORAL | Status: DC
  Administered 2016-01-23 – 2016-01-25 (×3): 20 mg via ORAL
  Filled 2016-01-23 (×3): qty 1

## 2016-01-23 MED ORDER — DOCUSATE SODIUM 100 MG PO CAPS
100.0000 mg | ORAL_CAPSULE | Freq: Two times a day (BID) | ORAL | Status: DC
  Administered 2016-01-23 – 2016-01-25 (×4): 100 mg via ORAL
  Filled 2016-01-23 (×4): qty 1

## 2016-01-23 MED ORDER — METOPROLOL SUCCINATE ER 50 MG PO TB24
50.0000 mg | ORAL_TABLET | Freq: Two times a day (BID) | ORAL | Status: DC
  Administered 2016-01-23 – 2016-01-25 (×4): 50 mg via ORAL
  Filled 2016-01-23 (×4): qty 1

## 2016-01-23 NOTE — ED Provider Notes (Signed)
[facility] Emergency Department Provider Note  ____________________________________________   I have reviewed the triage vital signs and the nursing notes.   HISTORY  Chief Complaint Medical Clearance    HPI Scott [name] is a 65 y.o. male with history of seizure disorder and schizophrenia who presents today with reported mania by staff at the facility where he stays. According to staff members with patient, he has not slept in 5 or 6 days. He is also making threats of harming himself and others. Patient denies SI. He states he has a supple last few days. He has no other complaints.Level 5 chart caveat; no further history available due to patient status.      Past Medical History:  Diagnosis Date  . Anxiety   . Chronic hyponatremia 05/2013, 11/2013  . Dementia   . Dyslipidemia   . GERD (gastroesophageal reflux disease)   . Hypertension   . Hypoglycemia   . Schizophrenia ([place])   . Seizures ([place])   . Syncope   . Tardive dyskinesia   . Urinary tract infection   . Vitamin D deficiency     There are no active problems to display for this patient.   Past Surgical History:  Procedure Laterality Date  . CATARACT EXTRACTION W/PHACO Left 01/10/2016   Procedure: CATARACT EXTRACTION PHACO AND INTRAOCULAR LENS PLACEMENT (IOC);  Surgeon: Birder Robson, MD;  Location: [facility] ORS;  Service: Ophthalmology;  Laterality: Left;  [place] 00:53 AP% 24.2 CDE 12.88 fluid pack lot # [id number]  . EYE SURGERY    . FOOT SURGERY      Prior to Admission medications   Medication Sig Start Date End Date Taking? Authorizing Provider  acetaminophen (TYLENOL) 500 MG tablet Take 500 mg by mouth every 6 (six) hours as needed.    Historical Provider, MD  amLODipine (NORVASC) 10 MG tablet Take 10 mg by mouth daily.    Historical Provider, MD  atorvastatin (LIPITOR) 20 MG tablet Take 20 mg by mouth daily.    Historical Provider, MD  Bromfenac Sodium (BROMSITE) 0.075 % SOLN Place  1 drop into the right eye 2 (two) times daily.    Historical Provider, MD  cetirizine (ZYRTEC) 10 MG tablet Take 10 mg by mouth daily.    Historical Provider, MD  Difluprednate (DUREZOL) 0.05 % EMUL Place 1 drop into the right eye 2 (two) times daily.    Historical Provider, MD  divalproex (DEPAKOTE) 500 MG DR tablet Take 2,000 mg by mouth at bedtime.    Historical Provider, MD  docusate sodium (COLACE) 100 MG capsule Take 100 mg by mouth 2 (two) times daily.    Historical Provider, MD  metFORMIN (GLUCOPHAGE) 500 MG tablet Take by mouth 2 (two) times daily with a meal.    Historical Provider, MD  metoprolol succinate (TOPROL-XL) 50 MG 24 hr tablet Take 50 mg by mouth 2 (two) times daily. Take with or immediately following a meal.     Historical Provider, MD  mirtazapine (REMERON) 15 MG tablet Take 15 mg by mouth at bedtime.    Historical Provider, MD  pantoprazole (PROTONIX) 40 MG tablet Take 40 mg by mouth 2 (two) times daily.    Historical Provider, MD  QUEtiapine (SEROQUEL) 200 MG tablet Take 200 mg by mouth at bedtime.    Historical Provider, MD  QUEtiapine (SEROQUEL) 25 MG tablet Take 25 mg by mouth 3 (three) times daily. Care giver states he also takes Seroquel 200mg  at bedtime    Historical Provider, MD  Skin Protectants, Misc. (BAZA PROTECT EX) Apply 1 application topically daily as needed.    Historical Provider, MD  sodium chloride 1 G tablet Take 1 g by mouth 4 (four) times daily.     Historical Provider, MD  tamsulosin (FLOMAX) 0.4 MG CAPS capsule Take 0.4 mg by mouth daily after breakfast.    Historical Provider, MD  trihexyphenidyl (ARTANE) 5 MG tablet Take 5 mg by mouth 2 (two) times daily.    Historical Provider, MD    Allergies Review of patient's allergies indicates no known allergies.  No family history on file.  Social History Social History  Substance Use Topics  . Smoking status: Current Every Day Smoker    Packs/day: 0.50    Types: Cigarettes  . Smokeless tobacco:  Never Used  . Alcohol use No    Review of Systems Constitutional: No fever/chills Eyes: No visual changes. ENT: No sore throat. No stiff neck no neck pain Cardiovascular: Denies chest pain. Respiratory: Denies shortness of breath. Gastrointestinal:   no vomiting.  No diarrhea.  No constipation. Genitourinary: Negative for dysuria. Musculoskeletal: Negative lower extremity swelling Skin: Negative for rash. Neurological: Negative for severe headaches, focal weakness or numbness. 10-point ROS otherwise negative.  ____________________________________________   PHYSICAL EXAM:  VITAL SIGNS: ED Triage Vitals [01/23/16 1337]  Enc Vitals Group     BP 123/78     Pulse Rate 72     Resp 18     Temp 97.7 F (36.5 C)     Temp Source Oral     SpO2 100 %     Weight 232 lb (105.2 kg)     Height 6\' 2"  (1.88 m)     Head Circumference      Peak Flow      Pain Score 0     Pain Loc      Pain Edu?      Excl. in [place]?     Constitutional: Alert and orientedTo name and place unsure of the date. Well appearing and in no acute distress. Eyes: Conjunctivae are normal. PERRL. EOMI. Head: Atraumatic. Nose: No congestion/rhinnorhea. Mouth/Throat: Mucous membranes are moist.  Oropharynx non-erythematous. Neck: No stridor.   Nontender with no meningismus Cardiovascular: Normal rate, regular rhythm. Grossly normal heart sounds.  Good peripheral circulation. Respiratory: Normal respiratory effort.  No retractions. Lungs CTAB. Abdominal: Soft and nontender. No distention. No guarding no rebound Back:  There is no focal tenderness or step off.  there is no midline tenderness there are no lesions noted. there is no CVA tenderness Musculoskeletal: No lower extremity tenderness, no upper extremity tenderness. No joint effusions, no DVT signs strong distal pulses no edema Neurologic:  Normal speech and language. No gross focal neurologic deficits are appreciated.  Skin:  Skin is warm, dry and intact. No  rash noted. Psychiatric: Mood and affect are normal. Speech and behavior are normal.  ____________________________________________   LABS (all labs ordered are listed, but only abnormal results are displayed)  Labs Reviewed  COMPREHENSIVE METABOLIC PANEL - Abnormal; Notable for the following:       Result Value   Sodium 130 (*)    Chloride 100 (*)    Glucose, Bld 159 (*)    ALT 15 (*)    All other components within normal limits  ACETAMINOPHEN LEVEL - Abnormal; Notable for the following:    Acetaminophen (Tylenol), Serum <10 (*)    All other components within normal limits  CBC - Abnormal; Notable for the following:  RBC 3.94 (*)    Hemoglobin 12.0 (*)    HCT 36.6 (*)    RDW 15.3 (*)    All other components within normal limits  URINE DRUG SCREEN, QUALITATIVE ([facility] ONLY) - Abnormal; Notable for the following:    Tricyclic, Ur Screen POSITIVE (*)    All other components within normal limits  ETHANOL  SALICYLATE LEVEL  URINALYSIS COMPLETEWITH MICROSCOPIC ([facility] ONLY)   ____________________________________________  EKG  I personally interpreted any EKGs ordered by me or triage  ____________________________________________  RADIOLOGY  I reviewed any imaging ordered by me or triage that were performed during my shift and, if possible, patient and/or family made aware of any abnormal findings. ____________________________________________   PROCEDURES  Procedure(s) performed: None  Procedures  Critical Care performed: None  ____________________________________________   INITIAL IMPRESSION / ASSESSMENT AND PLAN / ED COURSE  Pertinent labs & imaging results that were available during my care of the patient were reviewed by me and considered in my medical decision making (see chart for details).  Patient here for mania, history of hyponatremia with sodiums in the 130s at this time recent urinary tract infection however recheck a few days ago is negative we'll  recheck another one. He does not endorse SI or [facility] staff from the group home, he has been threatening to harm others and himself. He likely will need psychiatric evaluation.  Clinical Course   ____________________________________________   FINAL CLINICAL IMPRESSION(S) / ED DIAGNOSES  Final diagnoses:  None      This chart was dictated using voice recognition software.  Despite best efforts to proofread,  errors can occur which can change meaning.       Schuyler Amor, MD 01/23/16 1537

## 2016-01-23 NOTE — ED Notes (Signed)
Attempted to call caregiver for pt's eyedrops. Caregiver not answering at this time

## 2016-01-23 NOTE — ED Notes (Signed)
Pt given cup of water 

## 2016-01-23 NOTE — BH Assessment (Signed)
Tele Assessment Note Pt arrived to [facility] with his caregiver from [facility] due to symptoms of depression and reportedly not sleeping in the past 5 or 6 days. Pt reportedly has been experiencing crying spells lately and his caregiver expressed this is "unusual of him." Pt currently denies any SI and expressed he has had thoughts of harming others "at the church." Several times during the assessment, the pt was experiencing rambling and incoherent thought patterns. Pt's speech was difficult to comprehend and pt's caregiver had to explain what the pt was attempting to relay several times throughout the assessment.   Pt's caregiver reports the ACTT team and psychiatrist "fuss at him and makes him cry." Caregiver reports pt receives services through [facility] at least 1x/month. Pt reportedly has never received any prior inpatient services however throughout the assessment he reported that he "was in [place] about 5 years ago for 3 days" but pt was unable to coherently express the reason. Pt has reportedly fallen several times in the past 2 weeks and needs assistance with daily tasks. Pt reports he just "needs something to help him sleep." Pt continued to state "the shots are not helping me" throughout the assessment. Caregiver reports pt was diagnosed with schizophrenia and dementia 3 or 4 years ago.  Scott [name] is an 65 y.o. male.   Diagnosis: Major Depressive Disorder   Past Medical History:  Past Medical History:  Diagnosis Date   Anxiety    Chronic hyponatremia 05/2013, 11/2013   Dementia    Dyslipidemia    GERD (gastroesophageal reflux disease)    Hypertension    Hypoglycemia    Schizophrenia ([place])    Seizures ([place])    Syncope    Tardive dyskinesia    Urinary tract infection    Vitamin D deficiency     Past Surgical History:  Procedure Laterality Date   CATARACT EXTRACTION W/PHACO Left 01/10/2016   Procedure: CATARACT EXTRACTION  PHACO AND INTRAOCULAR LENS PLACEMENT ([place]);  Surgeon: Birder Robson, MD;  Location: [facility] ORS;  Service: Ophthalmology;  Laterality: Left;  [place] 00:53 AP% 24.2 CDE 12.88 fluid pack lot # [id number]   EYE SURGERY     FOOT SURGERY      Family History: No family history on file.  Social History:  reports that he has been smoking Cigarettes.  He has been smoking about 0.50 packs per day. He has never used smokeless tobacco. He reports that he does not drink alcohol or use drugs.  Additional Social History:  Alcohol / Drug Use Pain Medications: Pt denies abuse Prescriptions: Pt denies abuse Over the Counter: Pt denies abuse History of alcohol / drug use?: No history of alcohol / drug abuse  CIWA: CIWA-Ar BP: 123/78 Pulse Rate: 72 COWS:    PATIENT STRENGTHS: (choose at least two) Average or above average intelligence Motivation for treatment/growth Religious Affiliation  Allergies: No Known Allergies  Home Medications:  (Not in a hospital admission)  OB/GYN Status:  No LMP for male patient.  General Assessment Data Location of Assessment: [facility] ED TTS Assessment: In system Is this a Tele or Face-to-Face Assessment?: Tele Assessment Is this an Initial Assessment or a Re-assessment for this encounter?: Initial Assessment Marital status: Single Is patient pregnant?: No Pregnancy Status: No Living Arrangements: Other (Comment) [facility]) Can pt return to current living arrangement?: Yes Admission Status: Voluntary Is patient capable of signing voluntary admission?: Yes Referral Source: Self/Family/Friend Insurance type: Medicare  Crisis Care Plan Living Arrangements: Other (Comment) [facility]) Legal Guardian: Other: Robet Leu via [facility]) Name of Psychiatrist: unknown  Education Status Is patient currently in school?: No Highest grade of school patient has completed: some college  Risk to self with the past 6  months Suicidal Ideation: No Has patient been a risk to self within the past 6 months prior to admission? : No Suicidal Intent: No Has patient had any suicidal intent within the past 6 months prior to admission? : No Is patient at risk for suicide?: No Suicidal Plan?: No Has patient had any suicidal plan within the past 6 months prior to admission? : No Access to Means: No What has been your use of drugs/alcohol within the last 12 months?: none reported Previous Attempts/Gestures: No How many times?: 0 Triggers for Past Attempts: None known Intentional Self Injurious Behavior: None Family Suicide History: No Persecutory voices/beliefs?: No Depression: Yes Depression Symptoms: Tearfulness, Insomnia Substance abuse history and/or treatment for substance abuse?: No Suicide prevention information given to non-admitted patients: Not applicable  Risk to Others within the past 6 months Homicidal Ideation: No Does patient have any lifetime risk of violence toward others beyond the six months prior to admission? : No Thoughts of Harm to Others: Yes-Currently Present Comment - Thoughts of Harm to Others: pt reports he has thought of harming others in the church Current Homicidal Intent: No Current Homicidal Plan: No Access to Homicidal Means: No Identified Victim: pt reports "people in the church." - note: pt was experiencing rambling thoughts throughout assessment and at times incoherent History of harm to others?: No Assessment of Violence: None Noted Does patient have access to weapons?: No Criminal Charges Pending?: No Does patient have a court date: No Is patient on probation?: No  Psychosis Hallucinations: None noted Delusions: None noted  Mental Status Report Appearance/Hygiene: In scrubs Eye Contact: Good Motor Activity: Freedom of movement Speech: Incoherent, Rapid, Slurred, Soft Level of Consciousness: Alert Mood: Euphoric (pt reports he "feels alright most of the time"  ) Affect: Labile (pt began rambling and expressing incomplete thoughts) Anxiety Level: None Thought Processes: Flight of Ideas Judgement: Unimpaired Orientation: Person, Place, Time, Situation Obsessive Compulsive Thoughts/Behaviors: None  Cognitive Functioning Concentration: Normal Memory: Recent Intact, Remote Intact IQ: Average Insight: Fair Impulse Control: Good Appetite: Good Sleep: Decreased Total Hours of Sleep: 0 Vegetative Symptoms: None  ADLScreening [facility] Assessment Services) Independently performs ADLs?: No  Prior Inpatient Therapy Prior Inpatient Therapy: No  Prior Outpatient Therapy Prior Outpatient Therapy: No Does patient have an ACCT team?: Yes Does patient have Intensive In-House Services?  : No Does patient have [facility] services? : No Does patient have P4CC services?: No  ADL Screening (condition at time of admission) Is the patient deaf or have difficulty hearing?: No Does the patient have difficulty seeing, even when wearing glasses/contacts?: No Does the patient have difficulty concentrating, remembering, or making decisions?: No Does the patient have difficulty dressing or bathing?: No Independently performs ADLs?: No Communication: Independent Dressing (OT): Independent Grooming: Independent Feeding: Independent Bathing: Independent Toileting: Independent In/Out Bed: Needs assistance Walks in Home: Needs assistance Does the patient have difficulty walking or climbing stairs?: Yes Weakness of Legs: Both Weakness of Arms/Hands: None  Home Assistive Devices/Equipment Home Assistive Devices/Equipment: Grab bars in shower    Abuse/Neglect Assessment (Assessment to be complete while patient is alone) Physical Abuse: Denies Verbal Abuse: Denies Sexual Abuse: Denies Exploitation of patient/patient's resources: Denies Self-Neglect: Denies  Advance Directives (For Healthcare) Does patient have an advance directive?: No Would patient like  information on creating an advanced directive?: No - patient declined information    Additional Information 1:1 In Past 12 Months?: No CIRT Risk: No Elopement Risk: No Does patient have medical clearance?: No (pending)     Disposition: Pending [facility] psych evaluation  Disposition Initial Assessment Completed for this Encounter: Yes     Lyanne Co 01/23/2016 6:37 PM

## 2016-01-23 NOTE — ED Notes (Signed)
VOL/ Consult ordered  

## 2016-01-23 NOTE — ED Triage Notes (Signed)
Pt comes into the ED via caregiver from care home with c/o pt not sleeping for the past 6 days and threatening HI.Marland Kitchen Pt is calm and cooperative in triage at present.[facility], Robet Leu [phone].

## 2016-01-24 ENCOUNTER — Emergency Department: Payer: Medicare Other

## 2016-01-24 DIAGNOSIS — I10 Essential (primary) hypertension: Secondary | ICD-10-CM

## 2016-01-24 DIAGNOSIS — F200 Paranoid schizophrenia: Secondary | ICD-10-CM

## 2016-01-24 DIAGNOSIS — F209 Schizophrenia, unspecified: Secondary | ICD-10-CM

## 2016-01-24 DIAGNOSIS — G2401 Drug induced subacute dyskinesia: Secondary | ICD-10-CM

## 2016-01-24 DIAGNOSIS — K219 Gastro-esophageal reflux disease without esophagitis: Secondary | ICD-10-CM

## 2016-01-24 DIAGNOSIS — E871 Hypo-osmolality and hyponatremia: Secondary | ICD-10-CM

## 2016-01-24 DIAGNOSIS — F203 Undifferentiated schizophrenia: Secondary | ICD-10-CM

## 2016-01-24 DIAGNOSIS — F309 Manic episode, unspecified: Secondary | ICD-10-CM | POA: Diagnosis not present

## 2016-01-24 LAB — GLUCOSE, CAPILLARY: Glucose-Capillary: 93 mg/dL (ref 65–99)

## 2016-01-24 NOTE — Consult Note (Signed)
[facility] Consult   Reason for Consult:  Consult for this 65 year old man with a history of schizophrenia brought from his group home because of agitation and mood changes Referring Physician:  Jimmye Norman Patient Identification: Scott [name] MRN:  [id number] Principal Diagnosis: Schizophrenia [facility]) Diagnosis:   Patient Active Problem List   Diagnosis Date Noted  . Schizophrenia ([place]) [F20.9] 01/24/2016  . GERD (gastroesophageal reflux disease) [K21.9] 01/24/2016  . Tardive dyskinesia [G24.01] 01/24/2016  . Chronic hyponatremia [E87.1] 01/24/2016  . Hypertension [I10] 01/24/2016    Total Time spent with patient: 1 hour  Subjective:   Scott [name] is a 65 y.o. male patient admitted with "they told a lie".  HPI:  Patient interviewed. Chart reviewed. Labs and vitals reviewed. 65 year old man with a history of schizophrenia and multiple medical problems. Group home brought him over because of recent agitation. Mood has been more labile. Crying spells as well as agitation. Reportedly making verbal threats at the group home. They say he has not slept in at least 4 days. The patient himself denies all of these symptoms. He says that all of this is a lie and that he sleeps fine and has not been hostile to anyone. He is quite angry about it so it's easy to see where he probably escalates to being threatening. Patient denies suicidal or homicidal thoughts. Claims she's been compliant with all of his medicine. Denies any substance abuse.  Social history: Patient has a legal guardian. He lives in a group home. Patient probably has chronic mental illness that did not get treated until later in life because he lived with family for a long time.  Medical history: Multiple medical problems including elevated blood pressure gastric reflux symptoms tardive dyskinesia. He has chronic hyponatremia of unclear etiology. His sodium right now is not too bad. Blood sugars are running a  little bit high this time which is a little different than his baseline.  Substance abuse history: Reports that he used to drink but quit about 35 years ago. Denies any drug use.  Past Psychiatric History: Long history of mental illness. Has had psychiatric admissions at least one time in the past. Denies any history of suicide or violence. Had been stabilized on antipsychotics and mood stabilizers.  Risk to Self: Suicidal Ideation: No Suicidal Intent: No Is patient at risk for suicide?: No Suicidal Plan?: No Access to Means: No What has been your use of drugs/alcohol within the last 12 months?: none reported How many times?: 0 Triggers for Past Attempts: None known Intentional Self Injurious Behavior: None Risk to Others: Homicidal Ideation: No Thoughts of Harm to Others: Yes-Currently Present Comment - Thoughts of Harm to Others: pt reports he has thought of harming others in the church Current Homicidal Intent: No Current Homicidal Plan: No Access to Homicidal Means: No Identified Victim: pt reports "people in the church." - note: pt was experiencing rambling thoughts throughout assessment and at times incoherent History of harm to others?: No Assessment of Violence: None Noted Does patient have access to weapons?: No Criminal Charges Pending?: No Does patient have a court date: No Prior Inpatient Therapy: Prior Inpatient Therapy: No Prior Outpatient Therapy: Prior Outpatient Therapy: No Does patient have an ACCT team?: Yes Does patient have Intensive In-House Services?  : No Does patient have [facility] services? : No Does patient have P4[facility] services?: No  Past Medical History:  Past Medical History:  Diagnosis Date  . Anxiety   . Chronic hyponatremia 05/2013, 11/2013  .  Dementia   . Dyslipidemia   . GERD (gastroesophageal reflux disease)   . Hypertension   . Hypoglycemia   . Schizophrenia ([place])   . Seizures ([place])   . Syncope   . Tardive dyskinesia   . Urinary tract  infection   . Vitamin D deficiency     Past Surgical History:  Procedure Laterality Date  . CATARACT EXTRACTION W/PHACO Left 01/10/2016   Procedure: CATARACT EXTRACTION PHACO AND INTRAOCULAR LENS PLACEMENT (IOC);  Surgeon: Birder Robson, MD;  Location: [facility] ORS;  Service: Ophthalmology;  Laterality: Left;  [place] 00:53 AP% 24.2 CDE 12.88 fluid pack lot # [id number]  . EYE SURGERY    . FOOT SURGERY     Family History: No family history on file. Family Psychiatric  History: No known family history of mental illness Social History:  History  Alcohol Use No     History  Drug Use No    Social History   Social History  . Marital status: Single    Spouse name: N/A  . Number of children: N/A  . Years of education: N/A   Social History Main Topics  . Smoking status: Current Every Day Smoker    Packs/day: 0.50    Types: Cigarettes  . Smokeless tobacco: Never Used  . Alcohol use No  . Drug use: No  . Sexual activity: Not Asked   Other Topics Concern  . None   Social History Narrative  . None   Additional Social History:    Allergies:  No Known Allergies  Labs:  Results for orders placed or performed during the hospital encounter of 01/23/16 (from the past 48 hour(s))  Comprehensive metabolic panel     Status: Abnormal   Collection Time: 01/23/16  1:40 PM  Result Value Ref Range   Sodium 130 (L) 135 - 145 mmol/L   Potassium 4.4 3.5 - 5.1 mmol/L   Chloride 100 (L) 101 - 111 mmol/L   CO2 24 22 - 32 mmol/L   Glucose, Bld 159 (H) 65 - 99 mg/dL   BUN 13 6 - 20 mg/dL   Creatinine, Ser 0.93 0.61 - 1.24 mg/dL   Calcium 9.3 8.9 - 10.3 mg/dL   Total Protein 7.4 6.5 - 8.1 g/dL   Albumin 3.9 3.5 - 5.0 g/dL   AST 21 15 - 41 U/L   ALT 15 (L) 17 - 63 U/L   Alkaline Phosphatase 50 38 - 126 U/L   Total Bilirubin 0.4 0.3 - 1.2 mg/dL   GFR calc non Af Amer >60 >60 mL/min   GFR calc Af Amer >60 >60 mL/min    Comment: (NOTE) The eGFR has been calculated using the CKD EPI  equation. This calculation has not been validated in all clinical situations. eGFR's persistently <60 mL/min signify possible Chronic Kidney Disease.    Anion gap 6 5 - 15  Ethanol     Status: None   Collection Time: 01/23/16  1:40 PM  Result Value Ref Range   Alcohol, Ethyl (B) <5 <5 mg/dL    Comment:        LOWEST DETECTABLE LIMIT FOR SERUM ALCOHOL IS 5 mg/dL FOR MEDICAL PURPOSES ONLY   Salicylate level     Status: None   Collection Time: 01/23/16  1:40 PM  Result Value Ref Range   Salicylate Lvl <9.0 2.8 - 30.0 mg/dL  Acetaminophen level     Status: Abnormal   Collection Time: 01/23/16  1:40 PM  Result Value Ref Range  Acetaminophen (Tylenol), Serum <10 (L) 10 - 30 ug/mL    Comment:        THERAPEUTIC CONCENTRATIONS VARY SIGNIFICANTLY. A RANGE OF 10-30 ug/mL MAY BE AN EFFECTIVE CONCENTRATION FOR MANY PATIENTS. HOWEVER, SOME ARE BEST TREATED AT CONCENTRATIONS OUTSIDE THIS RANGE. ACETAMINOPHEN CONCENTRATIONS >150 ug/mL AT 4 HOURS AFTER INGESTION AND >50 ug/mL AT 12 HOURS AFTER INGESTION ARE OFTEN ASSOCIATED WITH TOXIC REACTIONS.   cbc     Status: Abnormal   Collection Time: 01/23/16  1:40 PM  Result Value Ref Range   WBC 5.9 3.8 - 10.6 K/uL   RBC 3.94 (L) 4.40 - 5.90 MIL/uL   Hemoglobin 12.0 (L) 13.0 - 18.0 g/dL   HCT 36.6 (L) 40.0 - 52.0 %   MCV 92.9 80.0 - 100.0 fL   MCH 30.6 26.0 - 34.0 pg   MCHC 32.9 32.0 - 36.0 g/dL   RDW 15.3 (H) 11.5 - 14.5 %   Platelets 197 150 - 440 K/uL  Urine Drug Screen, Qualitative     Status: Abnormal   Collection Time: 01/23/16  1:40 PM  Result Value Ref Range   Tricyclic, Ur Screen POSITIVE (A) NONE DETECTED   Amphetamines, Ur Screen NONE DETECTED NONE DETECTED   MDMA (Ecstasy)Ur Screen NONE DETECTED NONE DETECTED   Cocaine Metabolite,Ur [place] NONE DETECTED NONE DETECTED   Opiate, Ur Screen NONE DETECTED NONE DETECTED   Phencyclidine (PCP) Ur S NONE DETECTED NONE DETECTED   Cannabinoid 50 Ng, Ur [place] NONE DETECTED NONE DETECTED    Barbiturates, Ur Screen NONE DETECTED NONE DETECTED   Benzodiazepine, Ur Scrn NONE DETECTED NONE DETECTED   Methadone Scn, Ur NONE DETECTED NONE DETECTED    Comment: (NOTE) 179  Tricyclics, urine               Cutoff 1000 ng/mL 200  Amphetamines, urine             Cutoff 1000 ng/mL 300  MDMA (Ecstasy), urine           Cutoff 500 ng/mL 400  Cocaine Metabolite, urine       Cutoff 300 ng/mL 500  Opiate, urine                   Cutoff 300 ng/mL 600  Phencyclidine (PCP), urine      Cutoff 25 ng/mL 700  Cannabinoid, urine              Cutoff 50 ng/mL 800  Barbiturates, urine             Cutoff 200 ng/mL 900  Benzodiazepine, urine           Cutoff 200 ng/mL 1000 Methadone, urine                Cutoff 300 ng/mL 1100 1200 The urine drug screen provides only a preliminary, unconfirmed 1300 analytical test result and should not be used for non-medical 1400 purposes. Clinical consideration and professional judgment should 1500 be applied to any positive drug screen result due to possible 1600 interfering substances. A more specific alternate chemical method 1700 must be used in order to obtain a confirmed analytical result.  1800 Gas chromato graphy / mass spectrometry (GC/MS) is the preferred 1900 confirmatory method.   Urinalysis complete, with microscopic ([facility] only)     Status: Abnormal   Collection Time: 01/23/16  1:40 PM  Result Value Ref Range   Color, Urine YELLOW (A) YELLOW   APPearance CLEAR (A) CLEAR   Glucose, UA  150 (A) NEGATIVE mg/dL   Bilirubin Urine NEGATIVE NEGATIVE   Ketones, ur NEGATIVE NEGATIVE mg/dL   Specific Gravity, Urine 1.015 1.005 - 1.030   Hgb urine dipstick 1+ (A) NEGATIVE   pH 5.0 5.0 - 8.0   Protein, ur NEGATIVE NEGATIVE mg/dL   Nitrite NEGATIVE NEGATIVE   Leukocytes, UA NEGATIVE NEGATIVE   RBC / HPF 0-5 0 - 5 RBC/hpf   WBC, UA 0-5 0 - 5 WBC/hpf   Bacteria, UA NONE SEEN NONE SEEN   Squamous Epithelial / LPF 0-5 (A) NONE SEEN  Valproic acid level      Status: None   Collection Time: 01/23/16  1:40 PM  Result Value Ref Range   Valproic Acid Lvl 99 50.0 - 100.0 ug/mL  Glucose, capillary     Status: None   Collection Time: 01/24/16  7:42 AM  Result Value Ref Range   Glucose-Capillary 93 65 - 99 mg/dL    Current Facility-Administered Medications  Medication Dose Route Frequency Provider Last Rate Last Dose  . amLODipine (NORVASC) tablet 10 mg  10 mg Oral Daily Schuyler Amor, MD   10 mg at 01/24/16 0847  . atorvastatin (LIPITOR) tablet 20 mg  20 mg Oral Daily Schuyler Amor, MD   20 mg at 01/24/16 0849  . Bromfenac Sodium 0.075 % SOLN 1 drop  1 drop Right Eye BID Schuyler Amor, MD      . Difluprednate 0.05 % EMUL 1 drop  1 drop Right Eye BID Schuyler Amor, MD      . divalproex (DEPAKOTE) DR tablet 2,000 mg  2,000 mg Oral QHS Schuyler Amor, MD   2,000 mg at 01/23/16 2200  . docusate sodium (COLACE) capsule 100 mg  100 mg Oral BID Schuyler Amor, MD   100 mg at 01/24/16 0849  . metFORMIN (GLUCOPHAGE) tablet 500 mg  500 mg Oral BID WC Schuyler Amor, MD   500 mg at 01/24/16 0849  . metoprolol succinate (TOPROL-XL) 24 hr tablet 50 mg  50 mg Oral BID Schuyler Amor, MD   50 mg at 01/24/16 0848  . mirtazapine (REMERON) tablet 15 mg  15 mg Oral QHS Schuyler Amor, MD   15 mg at 01/23/16 2203  . pantoprazole (PROTONIX) EC tablet 40 mg  40 mg Oral BID Schuyler Amor, MD   40 mg at 01/24/16 0848  . QUEtiapine (SEROQUEL) tablet 200 mg  200 mg Oral QHS Schuyler Amor, MD   200 mg at 01/23/16 2201  . QUEtiapine (SEROQUEL) tablet 25 mg  25 mg Oral TID Schuyler Amor, MD   25 mg at 01/24/16 0849  . sodium chloride tablet 1 g  1 g Oral QID Schuyler Amor, MD   1 g at 01/24/16 0908  . tamsulosin (FLOMAX) capsule 0.4 mg  0.4 mg Oral QPC breakfast Schuyler Amor, MD   0.4 mg at 01/24/16 0847  . trihexyphenidyl (ARTANE) tablet 5 mg  5 mg Oral BID Schuyler Amor, MD   5 mg at 01/24/16 5176   Current Outpatient Prescriptions  Medication Sig  Dispense Refill  . amLODipine (NORVASC) 10 MG tablet Take 10 mg by mouth daily.    Marland Kitchen atorvastatin (LIPITOR) 20 MG tablet Take 20 mg by mouth daily.    . Bromfenac Sodium (BROMSITE) 0.075 % SOLN Place 1 drop into the right eye 2 (two) times daily.    . cetirizine (ZYRTEC) 10 MG tablet Take 10 mg  by mouth daily.    . Difluprednate (DUREZOL) 0.05 % EMUL Place 1 drop into the right eye 2 (two) times daily.    . divalproex (DEPAKOTE) 500 MG DR tablet Take 2,000 mg by mouth at bedtime.    . docusate sodium (COLACE) 100 MG capsule Take 100 mg by mouth 2 (two) times daily.    . metFORMIN (GLUCOPHAGE) 500 MG tablet Take by mouth 2 (two) times daily with a meal.    . metoprolol succinate (TOPROL-XL) 50 MG 24 hr tablet Take 50 mg by mouth 2 (two) times daily. Take with or immediately following a meal.     . mirtazapine (REMERON) 15 MG tablet Take 15 mg by mouth at bedtime.    . pantoprazole (PROTONIX) 40 MG tablet Take 40 mg by mouth 2 (two) times daily.    . QUEtiapine (SEROQUEL) 200 MG tablet Take 200 mg by mouth at bedtime.    Marland Kitchen QUEtiapine (SEROQUEL) 25 MG tablet Take 25 mg by mouth 3 (three) times daily. Care giver states he also takes Seroquel 24m at bedtime    . sodium chloride 1 G tablet Take 1 g by mouth 4 (four) times daily.     . tamsulosin (FLOMAX) 0.4 MG CAPS capsule Take 0.4 mg by mouth daily after breakfast.    . trihexyphenidyl (ARTANE) 5 MG tablet Take 5 mg by mouth 2 (two) times daily.    .Marland Kitchenacetaminophen (TYLENOL) 500 MG tablet Take 500 mg by mouth every 6 (six) hours as needed.    . Skin Protectants, Misc. (BAZA PROTECT EX) Apply 1 application topically daily as needed.      Musculoskeletal: Strength & Muscle Tone: decreased Gait & Station: normal Patient leans: N/A  Psychiatric Specialty Exam: Physical Exam  Nursing note and vitals reviewed. Constitutional: He appears well-developed and well-nourished.  HENT:  Head: Normocephalic and atraumatic.  Eyes: Conjunctivae are  normal. Pupils are equal, round, and reactive to light.  Neck: Normal range of motion.  Cardiovascular: Regular rhythm and normal heart sounds.   Respiratory: Effort normal. No respiratory distress.  GI: Soft.  Musculoskeletal: Normal range of motion.  Neurological: He is alert.  Patient has very significant tardive dyskinesia mostly in the mouth and to some extent in his hands  Skin: Skin is warm and dry.  Psychiatric: His affect is angry. His speech is tangential and slurred. Thought content is paranoid. Cognition and memory are impaired. He expresses impulsivity. He expresses no suicidal ideation. He is inattentive.    Review of Systems  Constitutional: Negative.   HENT: Negative.   Eyes: Negative.   Respiratory: Negative.   Cardiovascular: Negative.   Gastrointestinal: Negative.   Musculoskeletal: Negative.   Skin: Negative.   Neurological: Negative.   Psychiatric/Behavioral: Negative for depression, hallucinations, memory loss, substance abuse and suicidal ideas. The patient is not nervous/anxious and does not have insomnia.     Blood pressure 125/79, pulse 62, temperature 97 F (36.1 C), temperature source Axillary, resp. rate 17, height '6\' 2"'  (1.88 m), weight 105.2 kg (232 lb), SpO2 100 %.Body mass index is 29.79 kg/m.  General Appearance: Casual  Eye Contact:  Fair  Speech:  Garbled and Slurred  Volume:  Decreased  Mood:  Irritable  Affect:  Inappropriate  Thought Process:  Disorganized  Orientation:  Negative  Thought Content:  Illogical and Paranoid Ideation  Suicidal Thoughts:  No  Homicidal Thoughts:  No  Memory:  Immediate;   Good Recent;   Fair Remote;   Fair  Judgement:  Impaired  Insight:  Shallow  Psychomotor Activity:  Decreased and TD  Concentration:  Concentration: Poor  Recall:  [employer] of Knowledge:  Fair  Language:  Fair  Akathisia:  No  Handed:  Left  AIMS (if indicated):     Assets:  Financial Resources/Insurance Housing Resilience   ADL's:  Intact  Cognition:  Impaired,  Mild  Sleep:        Treatment Plan Summary: Daily contact with patient to assess and evaluate symptoms and progress in treatment, Medication management and Plan Patient was schizophrenia appears to of decompensated with more agitation and reportedly threats at home. He is currently mildly agitated and confused and disorganized in his thinking. We'll admit him to the hospital. He does not appear to have a urinary tract infection. We will continue his usual outpatient medicines as best I can tell right now. Check usual lab complement. Engage in groups and inpatient unit can decide on medicine changes.  Disposition: Recommend psychiatric Inpatient admission when medically cleared. Supportive therapy provided about ongoing stressors.  Alethia Berthold, MD 01/24/2016 11:00 AM

## 2016-01-24 NOTE — ED Notes (Signed)
Pt assisted to restroom, ambulatory with slow but steady gait. Pt provided water to drink per request. Pt laid back down on stretcher and covered his body with blankets.

## 2016-01-24 NOTE — ED Notes (Signed)
Pt took a shower with no problems , speech is very hard t understand due to TD ,pt taking fluids , he is calm and cooperative

## 2016-01-24 NOTE — [facility] Counselor (Signed)
TTS faxed copy of pt's chest x-ray and EKG to [phone].

## 2016-01-24 NOTE — ED Notes (Signed)
Pt assisted to bathroom via wheelchair.

## 2016-01-24 NOTE — ED Notes (Signed)

## 2016-01-24 NOTE — [facility] Counselor (Signed)
Referral information for Geriatric Placement have been faxed to;     [facility] [phone])    [facility] [phone])   [place] [phone] or [phone])    [facility] [phone])    [place] [phone])   [address] Lurena Joiner [phone])

## 2016-01-24 NOTE — ED Notes (Signed)
Assisted pt to bathroom. Pt walked by himself with this RN beside him.

## 2016-01-24 NOTE — ED Notes (Signed)
lunch given to patient.

## 2016-01-24 NOTE — ED Notes (Signed)
Pt woke up and given new warm blanket. Pt head adjusted down for comfort. Pt resting on L side on stretcher.

## 2016-01-24 NOTE — ED Notes (Signed)
Pt. Alert and oriented, warm and dry, in no distress. Pt. Denies SI, HI, and AVH. Pt. Encouraged to let nursing staff know of any concerns or needs. 

## 2016-01-24 NOTE — ED Provider Notes (Signed)
[facility] Emergency Department Provider Note  ____________________________________________   I have reviewed the triage vital signs and the nursing notes.   HISTORY  Chief Complaint Medical Clearance    HPI Scott [name] is a 65 y.o. male who has a history of schizophrenia, according to people at the group home, he has not slept in 4 or 5 days and has been making threats. Patient is compliant with his medications. He himself denies when to hurt himself or anyone else but he does admit that he has not been sleeping. They state that his behavior is becoming more erratic.     Past Medical History:  Diagnosis Date  . Anxiety   . Chronic hyponatremia 05/2013, 11/2013  . Dementia   . Dyslipidemia   . GERD (gastroesophageal reflux disease)   . Hypertension   . Hypoglycemia   . Schizophrenia ([place])   . Seizures ([place])   . Syncope   . Tardive dyskinesia   . Urinary tract infection   . Vitamin D deficiency     There are no active problems to display for this patient.   Past Surgical History:  Procedure Laterality Date  . CATARACT EXTRACTION W/PHACO Left 01/10/2016   Procedure: CATARACT EXTRACTION PHACO AND INTRAOCULAR LENS PLACEMENT (IOC);  Surgeon: Birder Robson, MD;  Location: [facility] ORS;  Service: Ophthalmology;  Laterality: Left;  [place] 00:53 AP% 24.2 CDE 12.88 fluid pack lot # [id number]  . EYE SURGERY    . FOOT SURGERY      Prior to Admission medications   Medication Sig Start Date End Date Taking? Authorizing Provider  amLODipine (NORVASC) 10 MG tablet Take 10 mg by mouth daily.   Yes Historical Provider, MD  atorvastatin (LIPITOR) 20 MG tablet Take 20 mg by mouth daily.   Yes Historical Provider, MD  Bromfenac Sodium (BROMSITE) 0.075 % SOLN Place 1 drop into the right eye 2 (two) times daily.   Yes Historical Provider, MD  cetirizine (ZYRTEC) 10 MG tablet Take 10 mg by mouth daily.   Yes Historical Provider, MD  Difluprednate (DUREZOL) 0.05  % EMUL Place 1 drop into the right eye 2 (two) times daily.   Yes Historical Provider, MD  divalproex (DEPAKOTE) 500 MG DR tablet Take 2,000 mg by mouth at bedtime.   Yes Historical Provider, MD  docusate sodium (COLACE) 100 MG capsule Take 100 mg by mouth 2 (two) times daily.   Yes Historical Provider, MD  metFORMIN (GLUCOPHAGE) 500 MG tablet Take by mouth 2 (two) times daily with a meal.   Yes Historical Provider, MD  metoprolol succinate (TOPROL-XL) 50 MG 24 hr tablet Take 50 mg by mouth 2 (two) times daily. Take with or immediately following a meal.    Yes Historical Provider, MD  mirtazapine (REMERON) 15 MG tablet Take 15 mg by mouth at bedtime.   Yes Historical Provider, MD  pantoprazole (PROTONIX) 40 MG tablet Take 40 mg by mouth 2 (two) times daily.   Yes Historical Provider, MD  QUEtiapine (SEROQUEL) 200 MG tablet Take 200 mg by mouth at bedtime.   Yes Historical Provider, MD  QUEtiapine (SEROQUEL) 25 MG tablet Take 25 mg by mouth 3 (three) times daily. Care giver states he also takes Seroquel 200mg  at bedtime   Yes Historical Provider, MD  sodium chloride 1 G tablet Take 1 g by mouth 4 (four) times daily.    Yes Historical Provider, MD  tamsulosin (FLOMAX) 0.4 MG CAPS capsule Take 0.4 mg by mouth daily after  breakfast.   Yes Historical Provider, MD  trihexyphenidyl (ARTANE) 5 MG tablet Take 5 mg by mouth 2 (two) times daily.   Yes Historical Provider, MD  acetaminophen (TYLENOL) 500 MG tablet Take 500 mg by mouth every 6 (six) hours as needed.    Historical Provider, MD  Skin Protectants, Misc. (BAZA PROTECT EX) Apply 1 application topically daily as needed.    Historical Provider, MD    Allergies Review of patient's allergies indicates no known allergies.  No family history on file.  Social History Social History  Substance Use Topics  . Smoking status: Current Every Day Smoker    Packs/day: 0.50    Types: Cigarettes  . Smokeless tobacco: Never Used  . Alcohol use No     Review of Systems Constitutional: No fever/chills Eyes: No visual changes. ENT: No sore throat. No stiff neck no neck pain Cardiovascular: Denies chest pain. Respiratory: Denies shortness of breath. Gastrointestinal:   no vomiting.  No diarrhea.  No constipation. Genitourinary: Negative for dysuria. Musculoskeletal: Negative lower extremity swelling Skin: Negative for rash. Neurological: Negative for severe headaches, focal weakness or numbness. 10-point ROS otherwise negative.  ____________________________________________   PHYSICAL EXAM:  VITAL SIGNS: ED Triage Vitals [01/23/16 1337]  Enc Vitals Group     BP 123/78     Pulse Rate 72     Resp 18     Temp 97.7 F (36.5 C)     Temp Source Oral     SpO2 100 %     Weight 232 lb (105.2 kg)     Height 6\' 2"  (1.88 m)     Head Circumference      Peak Flow      Pain Score 0     Pain Loc      Pain Edu?      Excl. in [place]?     Constitutional: Alert and oriented. Well appearing and in no acute distress. Eyes: Conjunctivae are normal. PERRL. EOMI. Head: Atraumatic. Nose: No congestion/rhinnorhea. Mouth/Throat: Mucous membranes are moist.  Oropharynx non-erythematous. Neck: No stridor.   Nontender with no meningismus Cardiovascular: Normal rate, regular rhythm. Grossly normal heart sounds.  Good peripheral circulation. Respiratory: Normal respiratory effort.  No retractions. Lungs CTAB. Abdominal: Soft and nontender. No distention. No guarding no rebound Back:  There is no focal tenderness or step off.  there is no midline tenderness there are no lesions noted. there is no CVA tenderness Musculoskeletal: No lower extremity tenderness, no upper extremity tenderness. No joint effusions, no DVT signs strong distal pulses no edema Neurologic:  Normal speech and language. No gross focal neurologic deficits are appreciated.  Skin:  Skin is warm, dry and intact. No rash noted. Psychiatric: Mood and affect are Remarkable for some  degree of pressured speech.. Speech and behavior are normal.  ____________________________________________   LABS (all labs ordered are listed, but only abnormal results are displayed)  Labs Reviewed  COMPREHENSIVE METABOLIC PANEL - Abnormal; Notable for the following:       Result Value   Sodium 130 (*)    Chloride 100 (*)    Glucose, Bld 159 (*)    ALT 15 (*)    All other components within normal limits  ACETAMINOPHEN LEVEL - Abnormal; Notable for the following:    Acetaminophen (Tylenol), Serum <10 (*)    All other components within normal limits  CBC - Abnormal; Notable for the following:    RBC 3.94 (*)    Hemoglobin 12.0 (*)  HCT 36.6 (*)    RDW 15.3 (*)    All other components within normal limits  URINE DRUG SCREEN, QUALITATIVE ([facility] ONLY) - Abnormal; Notable for the following:    Tricyclic, Ur Screen POSITIVE (*)    All other components within normal limits  URINALYSIS COMPLETEWITH MICROSCOPIC ([facility] ONLY) - Abnormal; Notable for the following:    Color, Urine YELLOW (*)    APPearance CLEAR (*)    Glucose, UA 150 (*)    Hgb urine dipstick 1+ (*)    Squamous Epithelial / LPF 0-5 (*)    All other components within normal limits  ETHANOL  SALICYLATE LEVEL  VALPROIC ACID LEVEL   ____________________________________________  EKG  I personally interpreted any EKGs ordered by me or triage  ____________________________________________  RADIOLOGY  I reviewed any imaging ordered by me or triage that were performed during my shift and, if possible, patient and/or family made aware of any abnormal findings. ____________________________________________   PROCEDURES  Procedure(s) performed: None  Procedures  Critical Care performed: None  ____________________________________________   INITIAL IMPRESSION / ASSESSMENT AND PLAN / ED COURSE  Pertinent labs & imaging results that were available during my care of the patient were reviewed by me and considered  in my medical decision making (see chart for details).  Patient here for essentially not sleeping for several days and according to group home staff making threats. We will keep him here for evaluation by psychiatry. Signed out to Dr. Dahlia Client at the end of my shift. I did IVC the patient because after the group home left, he was talking about wanting to walk out.  Clinical Course   ____________________________________________   FINAL CLINICAL IMPRESSION(S) / ED DIAGNOSES  Final diagnoses:  None      This chart was dictated using voice recognition software.  Despite best efforts to proofread,  errors can occur which can change meaning.       Schuyler Amor, MD 01/24/16 [phone]

## 2016-01-24 NOTE — ED Notes (Signed)
Pt w/ hx of dementia

## 2016-01-24 NOTE — [facility] Counselor (Signed)
TTS received call from Anderson Malta (Intake RN) with [facility] who is requesting a copy of pt's chest x-ray and EKG.

## 2016-01-24 NOTE — ED Notes (Signed)
Pt transferred from main ed, he is to be adm to in pt psych , [facility] notified, at this point refferrals have been sent out. He is asking to take a shower he is cooperative at this time,he is aware he is to be admitted and seems agreeable to the plan

## 2016-01-24 NOTE — ED Notes (Signed)
Patient transport to xray

## 2016-01-25 DIAGNOSIS — F309 Manic episode, unspecified: Secondary | ICD-10-CM | POA: Diagnosis not present

## 2016-01-25 NOTE — ED Provider Notes (Addendum)
Patient will be transferred to [facility], psychiatric unit. Labs reviewed. Patient is stable. Emtala filled.   Rudene Re, MD 01/25/16 [place], MD 01/25/16 [phone]

## 2016-01-25 NOTE — [facility] Counselor (Signed)
TTS Spoke with Anderson Malta (Intake RN) who reports:   [name] has accepted pt for inpatient tx. She reports they currently have only male beds and she will attempt to try and get pt a bed for "tonight".  She is requesting for copy of pt IVC paperwork to be faxed.

## 2016-01-25 NOTE — [facility] Counselor (Signed)
TTS called Anderson Malta (Intake RN [phone]) back to confirm if they were going to accept pt for inpatient admission - no answer.

## 2016-01-25 NOTE — Progress Notes (Signed)
Patient has been accepted to [facility].  Patient assigned to bed 105 Accepting physician is Dr. Alonna Minium.  Call report to [phone].  Representative was [employer].  Kenaz Olafson K. Nash Shearer, LPC-A, [facility]  Counselor 01/25/2016 10:20 AM

## 2016-01-25 NOTE — ED Notes (Signed)
Pt is alert and oriented this AM. Pt is only mildly confused and is independent with ADL's. Pt showered and was able to contact sister by phone and provide code number with staff assistance. Pt is pleasant and cooperative with staff and taking medications as prescribed. Pt denies SI/HI and AVH. New linens provided and 15 minute checks are ongoing for safety.

## 2016-01-25 NOTE — Discharge Instructions (Signed)
You have been seen in the Emergency Department (ED)  today for a psychiatric complaint.  You have been evaluated by psychiatry and we believe you are safe to be discharged from the hospital.   ° °Please return to the Emergency Department (ED)  immediately if you have ANY thoughts of hurting yourself or anyone else, so that we may help you. ° °Please avoid alcohol and drug use. ° °Follow up with your doctor and/or therapist as soon as possible regarding today's ED  visit.  ° °You may call crisis hotline for [place] at [phone]. ° °

## 2016-01-25 NOTE — [facility] Counselor (Signed)
TTS faxed copy of pt IVC paperwork per request of Anderson Malta (Intake RN).  Receipt confirmation received to confirm fax was successfully sent.

## 2016-01-25 NOTE — ED Provider Notes (Signed)
-----------------------------------------   7:18 AM on 01/25/2016 -----------------------------------------   Blood pressure 126/79, pulse 67, temperature 97.8 F (36.6 C), temperature source Oral, resp. rate 18, height 6\' 2"  (1.88 m), weight 232 lb (105.2 kg), SpO2 100 %.  The patient had no acute events since last update.  Calm and cooperative at this time.  Disposition is pending per Psychiatry/Behavioral Medicine team recommendations.     Paulette Blanch, MD 01/25/16 [phone]

## 2016-02-07 ENCOUNTER — Encounter: Payer: Self-pay | Admitting: Emergency Medicine

## 2016-02-07 ENCOUNTER — Emergency Department
Admission: EM | Admit: 2016-02-07 | Discharge: 2016-02-08 | Disposition: A | Discharge status: Other Acute Inpatient Hospital | Payer: Medicare Other | Attending: Emergency Medicine | Admitting: Emergency Medicine

## 2016-02-07 DIAGNOSIS — Z7984 Long term (current) use of oral hypoglycemic drugs: Secondary | ICD-10-CM | POA: Diagnosis not present

## 2016-02-07 DIAGNOSIS — F250 Schizoaffective disorder, bipolar type: Secondary | ICD-10-CM

## 2016-02-07 DIAGNOSIS — K219 Gastro-esophageal reflux disease without esophagitis: Secondary | ICD-10-CM | POA: Diagnosis present

## 2016-02-07 DIAGNOSIS — F319 Bipolar disorder, unspecified: Secondary | ICD-10-CM | POA: Diagnosis not present

## 2016-02-07 DIAGNOSIS — Z79899 Other long term (current) drug therapy: Secondary | ICD-10-CM | POA: Insufficient documentation

## 2016-02-07 DIAGNOSIS — F17210 Nicotine dependence, cigarettes, uncomplicated: Secondary | ICD-10-CM | POA: Insufficient documentation

## 2016-02-07 DIAGNOSIS — F258 Other schizoaffective disorders: Secondary | ICD-10-CM

## 2016-02-07 DIAGNOSIS — Z008 Encounter for other general examination: Secondary | ICD-10-CM | POA: Diagnosis present

## 2016-02-07 DIAGNOSIS — E119 Type 2 diabetes mellitus without complications: Secondary | ICD-10-CM

## 2016-02-07 DIAGNOSIS — R451 Restlessness and agitation: Secondary | ICD-10-CM

## 2016-02-07 DIAGNOSIS — I10 Essential (primary) hypertension: Secondary | ICD-10-CM | POA: Diagnosis not present

## 2016-02-07 DIAGNOSIS — F310 Bipolar disorder, current episode hypomanic: Secondary | ICD-10-CM

## 2016-02-07 DIAGNOSIS — G2401 Drug induced subacute dyskinesia: Secondary | ICD-10-CM | POA: Diagnosis present

## 2016-02-07 DIAGNOSIS — F209 Schizophrenia, unspecified: Secondary | ICD-10-CM | POA: Diagnosis not present

## 2016-02-07 DIAGNOSIS — F0390 Unspecified dementia without behavioral disturbance: Secondary | ICD-10-CM | POA: Insufficient documentation

## 2016-02-07 LAB — COMPREHENSIVE METABOLIC PANEL
ALT: 16 U/L — ABNORMAL LOW (ref 17–63)
ANION GAP: 9 (ref 5–15)
AST: 22 U/L (ref 15–41)
Albumin: 3.7 g/dL (ref 3.5–5.0)
Alkaline Phosphatase: 57 U/L (ref 38–126)
BUN: 11 mg/dL (ref 6–20)
CHLORIDE: 101 mmol/L (ref 101–111)
CO2: 20 mmol/L — ABNORMAL LOW (ref 22–32)
Calcium: 8.7 mg/dL — ABNORMAL LOW (ref 8.9–10.3)
Creatinine, Ser: 1 mg/dL (ref 0.61–1.24)
GFR calc Af Amer: >60 mL/min (ref >60)
Glucose, Bld: 240 mg/dL — ABNORMAL HIGH (ref 65–99)
POTASSIUM: 3.9 mmol/L (ref 3.5–5.1)
Sodium: 130 mmol/L — ABNORMAL LOW (ref 135–145)
Total Bilirubin: 0.6 mg/dL (ref 0.3–1.2)
Total Protein: 6.9 g/dL (ref 6.5–8.1)

## 2016-02-07 LAB — URINALYSIS COMPLETE WITH MICROSCOPIC (ARMC ONLY)
BACTERIA UA: NONE SEEN
Bilirubin Urine: NEGATIVE
Glucose, UA: >500 mg/dL — AB
HGB URINE DIPSTICK: NEGATIVE
Ketones, ur: NEGATIVE mg/dL
Leukocytes, UA: NEGATIVE
NITRITE: NEGATIVE
PH: 5 (ref 5.0–8.0)
PROTEIN: NEGATIVE mg/dL
RBC / HPF: NONE SEEN RBC/hpf (ref 0–5)
SPECIFIC GRAVITY, URINE: 1.02 (ref 1.005–1.030)

## 2016-02-07 LAB — ETHANOL

## 2016-02-07 LAB — URINE DRUG SCREEN, QUALITATIVE (ARMC ONLY)
AMPHETAMINES, UR SCREEN: NOT DETECTED
BENZODIAZEPINE, UR SCRN: NOT DETECTED
Barbiturates, Ur Screen: NOT DETECTED
Cannabinoid 50 Ng, Ur ~~LOC~~: NOT DETECTED
Cocaine Metabolite,Ur ~~LOC~~: NOT DETECTED
MDMA (ECSTASY) UR SCREEN: NOT DETECTED
METHADONE SCREEN, URINE: NOT DETECTED
Opiate, Ur Screen: NOT DETECTED
Phencyclidine (PCP) Ur S: NOT DETECTED
TRICYCLIC, UR SCREEN: POSITIVE — AB

## 2016-02-07 LAB — CBC
HCT: 32.5 % — ABNORMAL LOW (ref 40.0–52.0)
Hemoglobin: 11 g/dL — ABNORMAL LOW (ref 13.0–18.0)
MCH: 31.1 pg (ref 26.0–34.0)
MCHC: 33.8 g/dL (ref 32.0–36.0)
MCV: 91.9 fL (ref 80.0–100.0)
PLATELETS: 213 10*3/uL (ref 150–440)
RBC: 3.54 MIL/uL — AB (ref 4.40–5.90)
RDW: 15.2 % — ABNORMAL HIGH (ref 11.5–14.5)
WBC: 6 10*3/uL (ref 3.8–10.6)

## 2016-02-07 LAB — SALICYLATE LEVEL

## 2016-02-07 LAB — ACETAMINOPHEN LEVEL

## 2016-02-07 MED ORDER — DIVALPROEX SODIUM 500 MG PO DR TAB
2000.0000 mg | DELAYED_RELEASE_TABLET | Freq: Every day | ORAL | Status: DC
  Administered 2016-02-07: 2000 mg via ORAL
  Filled 2016-02-07: qty 4

## 2016-02-07 MED ORDER — METOPROLOL SUCCINATE ER 50 MG PO TB24
50.0000 mg | ORAL_TABLET | Freq: Every day | ORAL | Status: DC
  Administered 2016-02-07 – 2016-02-08 (×2): 50 mg via ORAL
  Filled 2016-02-07 (×2): qty 1

## 2016-02-07 MED ORDER — QUETIAPINE FUMARATE 200 MG PO TABS
600.0000 mg | ORAL_TABLET | Freq: Every day | ORAL | Status: DC
  Administered 2016-02-07: 600 mg via ORAL
  Filled 2016-02-07: qty 3

## 2016-02-07 MED ORDER — DOCUSATE SODIUM 100 MG PO CAPS
100.0000 mg | ORAL_CAPSULE | Freq: Two times a day (BID) | ORAL | Status: DC
  Administered 2016-02-07 – 2016-02-08 (×2): 100 mg via ORAL
  Filled 2016-02-07 (×2): qty 1

## 2016-02-07 MED ORDER — METFORMIN HCL 500 MG PO TABS
500.0000 mg | ORAL_TABLET | Freq: Two times a day (BID) | ORAL | Status: DC
  Administered 2016-02-08 (×2): 500 mg via ORAL
  Filled 2016-02-07 (×2): qty 1

## 2016-02-07 MED ORDER — INSULIN ASPART 100 UNIT/ML ~~LOC~~ SOLN
0.0000 [IU] | Freq: Three times a day (TID) | SUBCUTANEOUS | Status: DC
  Administered 2016-02-08: 3 [IU] via SUBCUTANEOUS
  Administered 2016-02-08: 2 [IU] via SUBCUTANEOUS
  Filled 2016-02-07: qty 2
  Filled 2016-02-07: qty 3

## 2016-02-07 MED ORDER — TAMSULOSIN HCL 0.4 MG PO CAPS
0.4000 mg | ORAL_CAPSULE | Freq: Every day | ORAL | Status: DC
  Administered 2016-02-07 – 2016-02-08 (×2): 0.4 mg via ORAL
  Filled 2016-02-07 (×2): qty 1

## 2016-02-07 MED ORDER — MIRTAZAPINE 15 MG PO TABS
15.0000 mg | ORAL_TABLET | Freq: Every day | ORAL | Status: DC
  Administered 2016-02-07: 15 mg via ORAL
  Filled 2016-02-07: qty 1

## 2016-02-07 MED ORDER — ATORVASTATIN CALCIUM 20 MG PO TABS
20.0000 mg | ORAL_TABLET | Freq: Every day | ORAL | Status: DC
  Administered 2016-02-08: 20 mg via ORAL
  Filled 2016-02-07: qty 1

## 2016-02-07 MED ORDER — PANTOPRAZOLE SODIUM 40 MG PO TBEC
40.0000 mg | DELAYED_RELEASE_TABLET | Freq: Every day | ORAL | Status: DC
  Administered 2016-02-08: 40 mg via ORAL
  Filled 2016-02-07: qty 1

## 2016-02-07 MED ORDER — AMLODIPINE BESYLATE 5 MG PO TABS
10.0000 mg | ORAL_TABLET | Freq: Every day | ORAL | Status: DC
  Administered 2016-02-08: 10 mg via ORAL
  Filled 2016-02-07: qty 2

## 2016-02-07 MED ORDER — LORAZEPAM 2 MG/ML IJ SOLN
2.0000 mg | Freq: Once | INTRAMUSCULAR | Status: AC
  Administered 2016-02-07: 2 mg via INTRAMUSCULAR
  Filled 2016-02-07: qty 1

## 2016-02-07 MED ORDER — LORAZEPAM 2 MG PO TABS: 2.0000 mg | ORAL_TABLET | ORAL | Status: DC | PRN

## 2016-02-07 MED ORDER — LITHIUM CARBONATE ER 450 MG PO TBCR
450.0000 mg | EXTENDED_RELEASE_TABLET | Freq: Two times a day (BID) | ORAL | Status: DC
  Administered 2016-02-07 – 2016-02-08 (×2): 450 mg via ORAL
  Filled 2016-02-07 (×2): qty 1

## 2016-02-07 NOTE — Consult Note (Signed)
[facility] Consult   Reason for Consult:  Consult for 65 year old man with a history of schizoaffective disorder brought here by his group home because of ongoing manic symptoms Referring Physician:  Joni Fears Patient Identification: OSAMU OLGUIN MRN:  [id number] Principal Diagnosis: Schizoaffective disorder, bipolar type [facility]) Diagnosis:   Patient Active Problem List   Diagnosis Date Noted  . Schizoaffective disorder, bipolar type ([place]) [F25.0] 02/07/2016  . Diabetes ([place]) [E11.9] 02/07/2016  . Schizophrenia ([place]) [F20.9] 01/24/2016  . GERD (gastroesophageal reflux disease) [K21.9] 01/24/2016  . Tardive dyskinesia [G24.01] 01/24/2016  . Chronic hyponatremia [E87.1] 01/24/2016  . Hypertension [I10] 01/24/2016    Total Time spent with patient: 1 hour  Subjective:   ARN MCOMBER is a 65 y.o. male patient admitted with "they were telling lies on me".  HPI:  Patient interviewed. Chart reviewed. Patient known from previous encounters. He was accompanied by a worker from his group home who gave more objective history. This 65 year old man with a history of chronic mental health problems was here at our emergency room just a few weeks ago and was sent to [facility] because of manic symptoms. He's been out of the hospital for about a week. During that time they report that his behavior and mental state have been identical to what he had before going into the hospital. They report that he sleeps almost none at all probably only a couple hours in the last several days. He is up all night talking to himself. Mood is labile and agitated. Appears to be responding to internal stimuli. Appears to be paranoid. Talks constantly about how people are telling lies on him. He hasn't actually assaulted anyone or been violent. Patient denies any suicidal or homicidal thoughts. He is hard to redirect and get a more clear history out of. His insight is poor. It looks like at [facility] they  only slightly increased his dose of Seroquel. No new medical problems although it looks like his diabetes is not under very good control. They had speculated that he could have a urinary tract infection when they brought him in although that doesn't appear to be the case. Evidently he has been compliant with his medicine. Asian himself answers to most of the questions that he is "doing okay" no evidence of any substance abuse.  Social history: Patient has a legal guardian. He is living in a group home. He's been there for most of a year. They feel like they know improving well in his current behavior is definitely not his baseline. Patient had previously lived most of his life with his family.  Medical history: Diabetes. Gastric reflux symptoms. Hypertension. Tardive dyskinesia.  Substance abuse history: Denies any alcohol or drug abuse there is no documented history of any past problems with substance abuse.  Past Psychiatric History: Patient again is not a very good historian we do know however that he was just at [facility] geriatric psychiatry unit until last week. He was there for about a week. Had a small adjustment to his Seroquel. Unknown what other medicines he has been on in the past. Reportedly his baseline behavior is very calm and nothing like his current situation. No known history of suicide or violence.  Risk to Self: Is patient at risk for suicide?: No, but patient needs Medical Clearance Risk to Others:   Prior Inpatient Therapy:   Prior Outpatient Therapy:    Past Medical History:  Past Medical History:  Diagnosis Date  . Anxiety   . Chronic  hyponatremia 05/2013, 11/2013  . Dementia   . Dyslipidemia   . GERD (gastroesophageal reflux disease)   . Hypertension   . Hypoglycemia   . Schizophrenia ([place])   . Seizures ([place])   . Syncope   . Tardive dyskinesia   . Urinary tract infection   . Vitamin D deficiency     Past Surgical History:  Procedure Laterality Date  .  CATARACT EXTRACTION W/PHACO Left 01/10/2016   Procedure: CATARACT EXTRACTION PHACO AND INTRAOCULAR LENS PLACEMENT (IOC);  Surgeon: Birder Robson, MD;  Location: [facility] ORS;  Service: Ophthalmology;  Laterality: Left;  [place] 00:53 AP% 24.2 CDE 12.88 fluid pack lot # [id number]  . EYE SURGERY    . FOOT SURGERY     Family History: History reviewed. No pertinent family history. Family Psychiatric  History: Patient is not aware of any family history of mental health problems but he is not a very reliable historian. Social History:  History  Alcohol Use No     History  Drug Use No    Social History   Social History  . Marital status: Single    Spouse name: N/A  . Number of children: N/A  . Years of education: N/A   Social History Main Topics  . Smoking status: Current Every Day Smoker    Packs/day: 0.50    Types: Cigarettes  . Smokeless tobacco: Never Used  . Alcohol use No  . Drug use: No  . Sexual activity: Not Asked   Other Topics Concern  . None   Social History Narrative  . None   Additional Social History:    Allergies:  No Known Allergies  Labs:  Results for orders placed or performed during the hospital encounter of 02/07/16 (from the past 48 hour(s))  Urine Drug Screen, Qualitative     Status: Abnormal   Collection Time: 02/07/16  1:32 PM  Result Value Ref Range   Tricyclic, Ur Screen POSITIVE (A) NONE DETECTED   Amphetamines, Ur Screen NONE DETECTED NONE DETECTED   MDMA (Ecstasy)Ur Screen NONE DETECTED NONE DETECTED   Cocaine Metabolite,Ur [place] NONE DETECTED NONE DETECTED   Opiate, Ur Screen NONE DETECTED NONE DETECTED   Phencyclidine (PCP) Ur S NONE DETECTED NONE DETECTED   Cannabinoid 50 Ng, Ur [place] NONE DETECTED NONE DETECTED   Barbiturates, Ur Screen NONE DETECTED NONE DETECTED   Benzodiazepine, Ur Scrn NONE DETECTED NONE DETECTED   Methadone Scn, Ur NONE DETECTED NONE DETECTED    Comment: (NOTE) 888  Tricyclics, urine               Cutoff 1000 ng/mL 200   Amphetamines, urine             Cutoff 1000 ng/mL 300  MDMA (Ecstasy), urine           Cutoff 500 ng/mL 400  Cocaine Metabolite, urine       Cutoff 300 ng/mL 500  Opiate, urine                   Cutoff 300 ng/mL 600  Phencyclidine (PCP), urine      Cutoff 25 ng/mL 700  Cannabinoid, urine              Cutoff 50 ng/mL 800  Barbiturates, urine             Cutoff 200 ng/mL 900  Benzodiazepine, urine           Cutoff 200 ng/mL 1000 Methadone, urine  Cutoff 300 ng/mL 1100 1200 The urine drug screen provides only a preliminary, unconfirmed 1300 analytical test result and should not be used for non-medical 1400 purposes. Clinical consideration and professional judgment should 1500 be applied to any positive drug screen result due to possible 1600 interfering substances. A more specific alternate chemical method 1700 must be used in order to obtain a confirmed analytical result.  1800 Gas chromato graphy / mass spectrometry (GC/MS) is the preferred 1900 confirmatory method.   Comprehensive metabolic panel     Status: Abnormal   Collection Time: 02/07/16  1:49 PM  Result Value Ref Range   Sodium 130 (L) 135 - 145 mmol/L   Potassium 3.9 3.5 - 5.1 mmol/L   Chloride 101 101 - 111 mmol/L   CO2 20 (L) 22 - 32 mmol/L   Glucose, Bld 240 (H) 65 - 99 mg/dL   BUN 11 6 - 20 mg/dL   Creatinine, Ser 1.00 0.61 - 1.24 mg/dL   Calcium 8.7 (L) 8.9 - 10.3 mg/dL   Total Protein 6.9 6.5 - 8.1 g/dL   Albumin 3.7 3.5 - 5.0 g/dL   AST 22 15 - 41 U/L   ALT 16 (L) 17 - 63 U/L   Alkaline Phosphatase 57 38 - 126 U/L   Total Bilirubin 0.6 0.3 - 1.2 mg/dL   GFR calc non Af Amer >60 >60 mL/min   GFR calc Af Amer >60 >60 mL/min    Comment: (NOTE) The eGFR has been calculated using the CKD EPI equation. This calculation has not been validated in all clinical situations. eGFR's persistently <60 mL/min signify possible Chronic Kidney Disease.    Anion gap 9 5 - 15  Ethanol     Status: None    Collection Time: 02/07/16  1:49 PM  Result Value Ref Range   Alcohol, Ethyl (B) <5 <5 mg/dL    Comment:        LOWEST DETECTABLE LIMIT FOR SERUM ALCOHOL IS 5 mg/dL FOR MEDICAL PURPOSES ONLY   Salicylate level     Status: None   Collection Time: 02/07/16  1:49 PM  Result Value Ref Range   Salicylate Lvl <4.9 2.8 - 30.0 mg/dL  Acetaminophen level     Status: Abnormal   Collection Time: 02/07/16  1:49 PM  Result Value Ref Range   Acetaminophen (Tylenol), Serum <10 (L) 10 - 30 ug/mL    Comment:        THERAPEUTIC CONCENTRATIONS VARY SIGNIFICANTLY. A RANGE OF 10-30 ug/mL MAY BE AN EFFECTIVE CONCENTRATION FOR MANY PATIENTS. HOWEVER, SOME ARE BEST TREATED AT CONCENTRATIONS OUTSIDE THIS RANGE. ACETAMINOPHEN CONCENTRATIONS >150 ug/mL AT 4 HOURS AFTER INGESTION AND >50 ug/mL AT 12 HOURS AFTER INGESTION ARE OFTEN ASSOCIATED WITH TOXIC REACTIONS.   cbc     Status: Abnormal   Collection Time: 02/07/16  1:49 PM  Result Value Ref Range   WBC 6.0 3.8 - 10.6 K/uL   RBC 3.54 (L) 4.40 - 5.90 MIL/uL   Hemoglobin 11.0 (L) 13.0 - 18.0 g/dL   HCT 32.5 (L) 40.0 - 52.0 %   MCV 91.9 80.0 - 100.0 fL   MCH 31.1 26.0 - 34.0 pg   MCHC 33.8 32.0 - 36.0 g/dL   RDW 15.2 (H) 11.5 - 14.5 %   Platelets 213 150 - 440 K/uL  Urinalysis complete, with microscopic     Status: Abnormal   Collection Time: 02/07/16  3:04 PM  Result Value Ref Range   Color, Urine YELLOW (A) YELLOW  APPearance CLEAR (A) CLEAR   Glucose, UA >500 (A) NEGATIVE mg/dL   Bilirubin Urine NEGATIVE NEGATIVE   Ketones, ur NEGATIVE NEGATIVE mg/dL   Specific Gravity, Urine 1.020 1.005 - 1.030   Hgb urine dipstick NEGATIVE NEGATIVE   pH 5.0 5.0 - 8.0   Protein, ur NEGATIVE NEGATIVE mg/dL   Nitrite NEGATIVE NEGATIVE   Leukocytes, UA NEGATIVE NEGATIVE   RBC / HPF NONE SEEN 0 - 5 RBC/hpf   WBC, UA 0-5 0 - 5 WBC/hpf   Bacteria, UA NONE SEEN NONE SEEN   Squamous Epithelial / LPF 0-5 (A) NONE SEEN   Mucous PRESENT     Current  Facility-Administered Medications  Medication Dose Route Frequency Provider Last Rate Last Dose  . amLODipine (NORVASC) tablet 10 mg  10 mg Oral Daily Gonzella Lex, MD      . Derrill Memo ON 02/08/2016] atorvastatin (LIPITOR) tablet 20 mg  20 mg Oral q1800 Gonzella Lex, MD      . divalproex (DEPAKOTE) DR tablet 2,000 mg  2,000 mg Oral QHS Gonzella Lex, MD      . docusate sodium (COLACE) capsule 100 mg  100 mg Oral BID Gonzella Lex, MD      . lithium carbonate (ESKALITH) CR tablet 450 mg  450 mg Oral Q12H Gonzella Lex, MD      . LORazepam (ATIVAN) tablet 2 mg  2 mg Oral Q4H PRN Gonzella Lex, MD      . Derrill Memo ON 02/08/2016] metFORMIN (GLUCOPHAGE) tablet 500 mg  500 mg Oral BID WC Gonzella Lex, MD      . metoprolol succinate (TOPROL-XL) 24 hr tablet 50 mg  50 mg Oral Daily Saben Donigan T Cassanda Walmer, MD      . mirtazapine (REMERON) tablet 15 mg  15 mg Oral QHS Collin Rengel T Marquette Blodgett, MD      . pantoprazole (PROTONIX) EC tablet 40 mg  40 mg Oral Daily Enaya Howze T Mychael Smock, MD      . QUEtiapine (SEROQUEL) tablet 600 mg  600 mg Oral QHS Sapna Padron T Saketh Daubert, MD      . tamsulosin (FLOMAX) capsule 0.4 mg  0.4 mg Oral QPC supper Gonzella Lex, MD       Current Outpatient Prescriptions  Medication Sig Dispense Refill  . acetaminophen (TYLENOL) 500 MG tablet Take 500 mg by mouth every 6 (six) hours as needed.    Marland Kitchen amLODipine (NORVASC) 10 MG tablet Take 10 mg by mouth daily.    Marland Kitchen atorvastatin (LIPITOR) 20 MG tablet Take 20 mg by mouth daily.    . Bromfenac Sodium (BROMSITE) 0.075 % SOLN Place 1 drop into the right eye 2 (two) times daily.    . cetirizine (ZYRTEC) 10 MG tablet Take 10 mg by mouth daily.    . Difluprednate (DUREZOL) 0.05 % EMUL Place 1 drop into the right eye 2 (two) times daily.    . divalproex (DEPAKOTE) 500 MG DR tablet Take 2,000 mg by mouth at bedtime.    . docusate sodium (COLACE) 100 MG capsule Take 100 mg by mouth 2 (two) times daily.    . metFORMIN (GLUCOPHAGE) 500 MG tablet Take by mouth 2 (two) times  daily with a meal.    . metoprolol succinate (TOPROL-XL) 50 MG 24 hr tablet Take 50 mg by mouth 2 (two) times daily. Take with or immediately following a meal.     . mirtazapine (REMERON) 15 MG tablet Take 15 mg by mouth at bedtime.    Marland Kitchen  pantoprazole (PROTONIX) 40 MG tablet Take 40 mg by mouth 2 (two) times daily.    . QUEtiapine (SEROQUEL) 200 MG tablet Take 200 mg by mouth at bedtime.    Marland Kitchen QUEtiapine (SEROQUEL) 25 MG tablet Take 25 mg by mouth 3 (three) times daily. Care giver states he also takes Seroquel 253m at bedtime    . Skin Protectants, Misc. (BAZA PROTECT EX) Apply 1 application topically daily as needed.    . sodium chloride 1 G tablet Take 1 g by mouth 4 (four) times daily.     . tamsulosin (FLOMAX) 0.4 MG CAPS capsule Take 0.4 mg by mouth daily after breakfast.    . trihexyphenidyl (ARTANE) 5 MG tablet Take 5 mg by mouth 2 (two) times daily.      Musculoskeletal: Strength & Muscle Tone: within normal limits Gait & Station: normal Patient leans: N/A  Psychiatric Specialty Exam: Physical Exam  Nursing note and vitals reviewed. Constitutional: He appears well-developed and well-nourished.  HENT:  Head: Normocephalic and atraumatic.  Eyes: Conjunctivae are normal. Pupils are equal, round, and reactive to light.  Neck: Normal range of motion.  Cardiovascular: Regular rhythm and normal heart sounds.   Respiratory: Effort normal. No respiratory distress.  GI: Soft.  Musculoskeletal: Normal range of motion.  Neurological: He is alert.  Skin: Skin is warm and dry.  Psychiatric: His affect is labile and inappropriate. His speech is slurred. He is agitated. Thought content is paranoid and delusional. He expresses impulsivity. He exhibits abnormal recent memory.    Review of Systems  Constitutional: Negative.   HENT: Negative.   Eyes: Negative.   Respiratory: Negative.   Cardiovascular: Negative.   Gastrointestinal: Negative.   Musculoskeletal: Negative.   Skin: Negative.    Neurological: Negative.   Psychiatric/Behavioral: Positive for memory loss. Negative for depression, hallucinations, substance abuse and suicidal ideas. The patient is nervous/anxious and has insomnia.     Pulse 88, temperature 97 F (36.1 C), temperature source Oral, resp. rate 20, weight 105.2 kg (232 lb), SpO2 98 %.Body mass index is 29.79 kg/m.  General Appearance: Disheveled  Eye Contact:  Minimal  Speech:  Garbled and Slurred  Volume:  Increased  Mood:  Irritable  Affect:  Non-Congruent, Inappropriate and Labile  Thought Process:  Disorganized and Irrelevant  Orientation:  Negative  Thought Content:  Illogical, Paranoid Ideation and Tangential  Suicidal Thoughts:  No  Homicidal Thoughts:  No  Memory:  Immediate;   Fair Recent;   Poor Remote;   Fair  Judgement:  Impaired  Insight:  Shallow  Psychomotor Activity:  Restlessness and TD  Concentration:  Concentration: Poor  Recall:  Poor  Fund of Knowledge:  Poor  Language:  Good  Akathisia:  No  Handed:  Right  AIMS (if indicated):     Assets:  Desire for Improvement Financial Resources/Insurance Housing Social Support  ADL's:  Impaired  Cognition:  Impaired,  Mild  Sleep:        Treatment Plan Summary: Daily contact with patient to assess and evaluate symptoms and progress in treatment, Medication management and Plan Patient appears to be showing manic symptoms with disorganized thinking. He has been agitated and disruptive at his group home. Labs reviewed. He does not appear to have a urinary tract infection but his blood sugars are running high. I suspect his diabetes is not under very good control. Patient requires rehospitalization because of his mania. He will be admitted back to the psychiatry unit. I don't see any reason to  have to send him to geriatric psychiatry. He is only 16 and is perfectly ambulatory. Continue Depakote and continue Seroquel but up the dose to 600 mg at night while starting lithium 450 mg  twice a day. Check valproic acid level tomorrow. Blood sugars will be checked regularly and we can institute sliding scale as well.  Disposition: Recommend psychiatric Inpatient admission when medically cleared. Supportive therapy provided about ongoing stressors.  Alethia Berthold, MD 02/07/2016 7:02 PM

## 2016-02-07 NOTE — ED Notes (Signed)
Pt here from group home. Group home staff report pt has only been sleeping 2 hrs per day. Has been restless and like in manic state. They think he may have UTI. Does have dysuria.  Hx dementia.

## 2016-02-07 NOTE — ED Triage Notes (Signed)
Pt to ed with caregiver from group home.  Per caregiver pt has displayed manic behaviors lately.  Pt reports he thinks he may have a UTI.  Pt denies SI, denies HI.  Per caregiver pt has felt restless and had agitation and poor sleep lately.

## 2016-02-07 NOTE — ED Notes (Signed)
Pt has meal tray

## 2016-02-07 NOTE — ED Provider Notes (Signed)
[facility] Emergency Department Provider Note  ____________________________________________  Time seen: Approximately 3:41 PM  I have reviewed the triage vital signs and the nursing notes.   HISTORY  Chief Complaint Psychiatric Evaluation    HPI Scott [name] is a 65 y.o. male brought to the ED if from his group home at the direction of his outpatient psychiatrist due to worseningpsychiatric symptoms over the past week. 2 weeks ago he was in the emergency department and transferred to [facility] for inpatient psychiatry. Group home representative states that his Seroquel was increased at that time which they have been compliant with that the patient has not been sleeping and has become increasingly restless and agitated, similar symptoms to what brought him to the ED 2 weeks ago. It seems that this has not improved despite his recent stay at [facility]. His outpatient psychiatrist advised him be brought to the ED for evaluation today.  I spoke with Cooper Render from the [place] team who confirms this overall impression of the patient's progress since his last hospitalization.  They note that despite the increased dose of Seroquel his symptoms are completely unchanged from when he was brought to the hospital last time and he has not improved at all. He has not slept the whole time is benign his group home.   Past Medical History:  Diagnosis Date  . Anxiety   . Chronic hyponatremia 05/2013, 11/2013  . Dementia   . Dyslipidemia   . GERD (gastroesophageal reflux disease)   . Hypertension   . Hypoglycemia   . Schizophrenia ([place])   . Seizures ([place])   . Syncope   . Tardive dyskinesia   . Urinary tract infection   . Vitamin D deficiency      Patient Active Problem List   Diagnosis Date Noted  . Schizophrenia ([place]) 01/24/2016  . GERD (gastroesophageal reflux disease) 01/24/2016  . Tardive dyskinesia 01/24/2016  . Chronic hyponatremia  01/24/2016  . Hypertension 01/24/2016     Past Surgical History:  Procedure Laterality Date  . CATARACT EXTRACTION W/PHACO Left 01/10/2016   Procedure: CATARACT EXTRACTION PHACO AND INTRAOCULAR LENS PLACEMENT (IOC);  Surgeon: Birder Robson, MD;  Location: [facility] ORS;  Service: Ophthalmology;  Laterality: Left;  [place] 00:53 AP% 24.2 CDE 12.88 fluid pack lot # [id number]  . EYE SURGERY    . FOOT SURGERY       Prior to Admission medications   Medication Sig Start Date End Date Taking? Authorizing Provider  acetaminophen (TYLENOL) 500 MG tablet Take 500 mg by mouth every 6 (six) hours as needed.    Historical Provider, MD  amLODipine (NORVASC) 10 MG tablet Take 10 mg by mouth daily.    Historical Provider, MD  atorvastatin (LIPITOR) 20 MG tablet Take 20 mg by mouth daily.    Historical Provider, MD  Bromfenac Sodium (BROMSITE) 0.075 % SOLN Place 1 drop into the right eye 2 (two) times daily.    Historical Provider, MD  cetirizine (ZYRTEC) 10 MG tablet Take 10 mg by mouth daily.    Historical Provider, MD  Difluprednate (DUREZOL) 0.05 % EMUL Place 1 drop into the right eye 2 (two) times daily.    Historical Provider, MD  divalproex (DEPAKOTE) 500 MG DR tablet Take 2,000 mg by mouth at bedtime.    Historical Provider, MD  docusate sodium (COLACE) 100 MG capsule Take 100 mg by mouth 2 (two) times daily.    Historical Provider, MD  metFORMIN (GLUCOPHAGE) 500 MG tablet Take by  mouth 2 (two) times daily with a meal.    Historical Provider, MD  metoprolol succinate (TOPROL-XL) 50 MG 24 hr tablet Take 50 mg by mouth 2 (two) times daily. Take with or immediately following a meal.     Historical Provider, MD  mirtazapine (REMERON) 15 MG tablet Take 15 mg by mouth at bedtime.    Historical Provider, MD  pantoprazole (PROTONIX) 40 MG tablet Take 40 mg by mouth 2 (two) times daily.    Historical Provider, MD  QUEtiapine (SEROQUEL) 200 MG tablet Take 200 mg by mouth at bedtime.    Historical Provider, MD   QUEtiapine (SEROQUEL) 25 MG tablet Take 25 mg by mouth 3 (three) times daily. Care giver states he also takes Seroquel 200mg  at bedtime    Historical Provider, MD  Skin Protectants, Misc. (BAZA PROTECT EX) Apply 1 application topically daily as needed.    Historical Provider, MD  sodium chloride 1 G tablet Take 1 g by mouth 4 (four) times daily.     Historical Provider, MD  tamsulosin (FLOMAX) 0.4 MG CAPS capsule Take 0.4 mg by mouth daily after breakfast.    Historical Provider, MD  trihexyphenidyl (ARTANE) 5 MG tablet Take 5 mg by mouth 2 (two) times daily.    Historical Provider, MD     Allergies Review of patient's allergies indicates no known allergies.   History reviewed. No pertinent family history.  Social History Social History  Substance Use Topics  . Smoking status: Current Every Day Smoker    Packs/day: 0.50    Types: Cigarettes  . Smokeless tobacco: Never Used  . Alcohol use No    Review of Systems  Constitutional:   No fever or chills.  ENT:   No sore throat. No rhinorrhea. Cardiovascular:   No chest pain. Respiratory:   No dyspnea or cough. Gastrointestinal:   Negative for abdominal pain, vomiting and diarrhea.  Genitourinary:   Positive irregular urination. No dysuria. Musculoskeletal:   Negative for focal pain or swelling Neurological:   Negative for headaches 10-point ROS otherwise negative.  ____________________________________________   PHYSICAL EXAM:  VITAL SIGNS: ED Triage Vitals  Enc Vitals Group     BP --      Pulse Rate 02/07/16 1328 88     Resp 02/07/16 1328 20     Temp 02/07/16 1328 97 F (36.1 C)     Temp Source 02/07/16 1328 Oral     SpO2 02/07/16 1328 98 %     Weight 02/07/16 1329 232 lb (105.2 kg)     Height --      Head Circumference --      Peak Flow --      Pain Score 02/07/16 1329 0     Pain Loc --      Pain Edu? --      Excl. in [place]? --     Vital signs reviewed, nursing assessments reviewed.   Constitutional:   Alert  and oriented. Well appearing and in no distress. Eyes:   No scleral icterus. No conjunctival pallor. PERRL. EOMI.  No nystagmus. ENT   Head:   Normocephalic and atraumatic.   Nose:   No congestion/rhinnorhea. No septal hematoma   Mouth/Throat:   MMM, no pharyngeal erythema. No peritonsillar mass.No remaining teeth    Neck:   No stridor. No SubQ emphysema. No meningismus. Hematological/Lymphatic/Immunilogical:   No cervical lymphadenopathy. Cardiovascular:   RRR. Symmetric bilateral radial and DP pulses.  No murmurs.  Respiratory:   Normal respiratory  effort without tachypnea nor retractions. Breath sounds are clear and equal bilaterally. No wheezes/rales/rhonchi. Gastrointestinal:   Soft and nontender. Non distended. There is no CVA tenderness.  No rebound, rigidity, or guarding. Genitourinary:   deferred Musculoskeletal:   Nontender with normal range of motion in all extremities. No joint effusions.  No lower extremity tenderness.  No edema. Neurologic:   Normal speech and language.  CN 2-10 normal. Motor grossly intact. No gross focal neurologic deficits are appreciated.  Skin:    Skin is warm, dry and intact. No rash noted.  No petechiae, purpura, or bullae.  ____________________________________________    LABS (pertinent positives/negatives) (all labs ordered are listed, but only abnormal results are displayed) Labs Reviewed  COMPREHENSIVE METABOLIC PANEL - Abnormal; Notable for the following:       Result Value   Sodium 130 (*)    CO2 20 (*)    Glucose, Bld 240 (*)    Calcium 8.7 (*)    ALT 16 (*)    All other components within normal limits  ACETAMINOPHEN LEVEL - Abnormal; Notable for the following:    Acetaminophen (Tylenol), Serum <10 (*)    All other components within normal limits  CBC - Abnormal; Notable for the following:    RBC 3.54 (*)    Hemoglobin 11.0 (*)    HCT 32.5 (*)    RDW 15.2 (*)    All other components within normal limits  URINE DRUG  SCREEN, QUALITATIVE ([facility] ONLY) - Abnormal; Notable for the following:    Tricyclic, Ur Screen POSITIVE (*)    All other components within normal limits  URINALYSIS COMPLETEWITH MICROSCOPIC ([facility] ONLY) - Abnormal; Notable for the following:    Color, Urine YELLOW (*)    APPearance CLEAR (*)    Glucose, UA >500 (*)    Squamous Epithelial / LPF 0-5 (*)    All other components within normal limits  ETHANOL  SALICYLATE LEVEL   ____________________________________________   EKG    ____________________________________________    RADIOLOGY    ____________________________________________   PROCEDURES Procedures  ____________________________________________   INITIAL IMPRESSION / ASSESSMENT AND PLAN / ED COURSE  Pertinent labs & imaging results that were available during my care of the patient were reviewed by me and considered in my medical decision making (see chart for details).  Patient brought to the ED due to insomnia restlessness and agitation, consistent with the same symptoms he was having prior to his last psychiatric hospitalization 2 weeks ago, which have not improved despite changes in his medication regimen. I will request a psychiatric evaluation for him here for consideration of further inpatient management.    ----------------------------------------- 6:01 PM on 02/07/2016 -----------------------------------------  Discussed with Dr. Weber Cooks after his evaluation of the patient. He advises the patient will likely benefit from hospitalization and further behavioral medicine treatment. Urinalysis negative. Patient is medically stable at this time.   Clinical Course   ____________________________________________   FINAL CLINICAL IMPRESSION(S) / ED DIAGNOSES  Final diagnoses:  Agitation  Bipolar affective disorder, current episode hypomanic ([place])       Portions of this note were generated with dragon dictation software. Dictation errors may occur  despite best attempts at proofreading.    Carrie Mew, MD 02/07/16 [phone]

## 2016-02-08 ENCOUNTER — Emergency Department: Payer: Medicare Other

## 2016-02-08 DIAGNOSIS — F319 Bipolar disorder, unspecified: Secondary | ICD-10-CM | POA: Diagnosis not present

## 2016-02-08 LAB — GLUCOSE, CAPILLARY
GLUCOSE-CAPILLARY: 115 mg/dL — AB (ref 65–99)
GLUCOSE-CAPILLARY: 149 mg/dL — AB (ref 65–99)
GLUCOSE-CAPILLARY: 179 mg/dL — AB (ref 65–99)

## 2016-02-08 LAB — HEMOGLOBIN A1C: HEMOGLOBIN A1C: 6.7 % — AB (ref 4.0–6.0)

## 2016-02-08 NOTE — ED Notes (Signed)
Called by Kerry Dory pt accepted to [facility] in [place]  Pt is not IVC so Kerry Dory is contacting Dr. Weber Cooks to see what to do.

## 2016-02-08 NOTE — BH Assessment (Signed)
Assessment Note  Scott [name] is an 65 y.o. male who presents to the ER due to an increase of manic like behaviors. According the [place] staff, he isn't sleeping and he is responding to internal stimuli. This have gone on for several days. He was recently inpatient with a [facility], with similar presentation. Upon discharged his symptoms continued to worsen.  Prior to coming to the ER, he was [facility] by [facility] ACT Team), they recommended the patient is in need of inpatient treatment.  During the interview, the patient was calm, cooperative and pleasant.  Diagnosis: Schizoaffective Disorder  Past Medical History:  Past Medical History:  Diagnosis Date  . Anxiety   . Chronic hyponatremia 05/2013, 11/2013  . Dementia   . Dyslipidemia   . GERD (gastroesophageal reflux disease)   . Hypertension   . Hypoglycemia   . Schizophrenia ([place])   . Seizures ([place])   . Syncope   . Tardive dyskinesia   . Urinary tract infection   . Vitamin D deficiency     Past Surgical History:  Procedure Laterality Date  . CATARACT EXTRACTION W/PHACO Left 01/10/2016   Procedure: CATARACT EXTRACTION PHACO AND INTRAOCULAR LENS PLACEMENT (IOC);  Surgeon: Birder Robson, MD;  Location: [facility] ORS;  Service: Ophthalmology;  Laterality: Left;  [place] 00:53 AP% 24.2 CDE 12.88 fluid pack lot # [id number]  . EYE SURGERY    . FOOT SURGERY      Family History: History reviewed. No pertinent family history.  Social History:  reports that he has been smoking Cigarettes.  He has been smoking about 0.50 packs per day. He has never used smokeless tobacco. He reports that he does not drink alcohol or use drugs.  Additional Social History:  Alcohol / Drug Use Pain Medications: See PTA Prescriptions: See PTA Over the Counter: See PTA History of alcohol / drug use?: No history of alcohol / drug abuse Longest period of sobriety (when/how long): Reports of none Negative Consequences of Use:   (Reports of none) Withdrawal Symptoms:  (Reports of none)  CIWA: CIWA-Ar BP: 135/89 Pulse Rate: 64 COWS:    Allergies: No Known Allergies  Home Medications:  (Not in a hospital admission)  OB/GYN Status:  No LMP for male patient.  General Assessment Data Location of Assessment: [facility] ED TTS Assessment: In system Is this a Tele or Face-to-Face Assessment?: Face-to-Face Is this an Initial Assessment or a Re-assessment for this encounter?: Initial Assessment Marital status: Single Maiden name: n/a Is patient pregnant?: No Pregnancy Status: No Living Arrangements: Other (Comment) ([place] [phone])) Can pt return to current living arrangement?: Yes Admission Status: Involuntary Is patient capable of signing voluntary admission?: No Referral Source: Other (Care Home) Insurance type: Medicare  Medical Screening Exam ([place]) Medical Exam completed: Yes  Crisis Care Plan Living Arrangements: Other (Comment) ([place] [phone])) Legal Guardian: Other: (Unknown) Name of Psychiatrist: [place] Name of Therapist: [facility]  Education Status Is patient currently in school?: No Current Grade: n/a Highest grade of school patient has completed: some college Name of school: n/a Contact person: n/a  Risk to self with the past 6 months Suicidal Ideation: No Has patient been a risk to self within the past 6 months prior to admission? : No Suicidal Intent: No Has patient had any suicidal intent within the past 6 months prior to admission? : No Is patient at risk for suicide?: No Suicidal Plan?: No Has patient  had any suicidal plan within the past 6 months prior to admission? : No Access to Means: No What has been your use of drugs/alcohol within the last 12 months?: Reports of none Previous Attempts/Gestures: No Other Self Harm Risks: Reports of none Triggers for Past Attempts: None known Intentional Self  Injurious Behavior: None Family Suicide History: No Recent stressful life event(s): Other (Comment) (Reports of none) Persecutory voices/beliefs?: No Depression: No Depression Symptoms: Insomnia Substance abuse history and/or treatment for substance abuse?: No Suicide prevention information given to non-admitted patients: Not applicable  Risk to Others within the past 6 months Homicidal Ideation: No Does patient have any lifetime risk of violence toward others beyond the six months prior to admission? : No Thoughts of Harm to Others: No Comment - Thoughts of Harm to Others: Reports of none Current Homicidal Intent: No Current Homicidal Plan: No Access to Homicidal Means: No Identified Victim: Reports of none History of harm to others?: No Assessment of Violence: None Noted Violent Behavior Description: Reports of none Does patient have access to weapons?: No Criminal Charges Pending?: No Does patient have a court date: No Is patient on probation?: No  Psychosis Hallucinations: None noted Delusions: None noted  Mental Status Report Appearance/Hygiene: In scrubs, In hospital gown, Unremarkable Eye Contact: Fair Motor Activity: Unable to assess (Patient laying in the bed.) Speech: Logical/coherent, Unremarkable Level of Consciousness: Alert Mood: Helpless, Sad, Pleasant Affect: Sad, Depressed, Appropriate to circumstance Anxiety Level: Minimal Thought Processes: Coherent, Relevant Judgement: Impaired Orientation: Person, Place, Time, Situation, Appropriate for developmental age Obsessive Compulsive Thoughts/Behaviors: Minimal  Cognitive Functioning Concentration: Decreased Memory: Recent Intact, Remote Intact IQ: Average Insight: Poor Impulse Control: Poor Appetite: Fair Weight Loss: 0 Weight Gain: 0 Sleep: Decreased Total Hours of Sleep: 0 Vegetative Symptoms: None  ADLScreening [facility] Assessment Services) Patient's cognitive ability adequate to safely complete  daily activities?: Yes Patient able to express need for assistance with ADLs?: Yes Independently performs ADLs?: Yes (appropriate for developmental age)  Prior Inpatient Therapy Prior Inpatient Therapy: Yes Prior Therapy Dates: 01/2016 Prior Therapy Facilty/Provider(s): [facility] [place] Reason for Treatment: Manic Behaviors  Prior Outpatient Therapy Prior Outpatient Therapy: Yes Prior Therapy Dates: Current Prior Therapy Facilty/Provider(s): [facility] Reason for Treatment: Schizoaffective Disorder Does patient have an ACCT team?: Yes Does patient have Intensive In-House Services?  : No Does patient have [facility] services? : No Does patient have P4CC services?: No  ADL Screening (condition at time of admission) Patient's cognitive ability adequate to safely complete daily activities?: Yes Patient able to express need for assistance with ADLs?: Yes Independently performs ADLs?: Yes (appropriate for developmental age)       Abuse/Neglect Assessment (Assessment to be complete while patient is alone) Physical Abuse: Denies Verbal Abuse: Denies Sexual Abuse: Denies Exploitation of patient/patient's resources: Denies Self-Neglect: Denies Values / Beliefs Cultural Requests During Hospitalization: None Spiritual Requests During Hospitalization: None Consults Spiritual Care Consult Needed: No Social Work Consult Needed: No      Additional Information 1:1 In Past 12 Months?: No CIRT Risk: No Elopement Risk: No Does patient have medical clearance?: Yes  Child/Adolescent Assessment Running Away Risk: Denies (Patient is an adult)  Disposition:  Disposition Initial Assessment Completed for this Encounter: Yes (ER MD Ordered Psych Consult)  On Site Evaluation by:   Reviewed with Physician:    Gunnar Fusi MS, LCAS, LPC, [place], CCSI Therapeutic Triage Specialist 02/08/2016 9:21 AM

## 2016-02-08 NOTE — ED Notes (Signed)
Lunch was given to patient 

## 2016-02-08 NOTE — ED Notes (Signed)
VOL/Pending Admit to BMU

## 2016-02-08 NOTE — ED Provider Notes (Signed)
-----------------------------------------   6:43 AM on 02/08/2016 -----------------------------------------   Blood pressure 135/89, pulse 64, temperature 97.6 F (36.4 C), temperature source Oral, resp. rate 18, height 6\' 3"  (1.905 m), weight 239 lb (108.4 kg), SpO2 100 %.  The patient had no acute events since last update.  Calm and cooperative at this time.  Disposition is pending Psychiatry/Behavioral Medicine team recommendations.     Paulette Blanch, MD 02/08/16 [phone]

## 2016-02-08 NOTE — BH Assessment (Signed)
Received phone call from [place] [facility]) stating the patient wasn't under IVC. Writer spoke with ER MD (Dr. Archie Balboa) and he advised to talked with Psychiatrist about patient being placed under IVC. Spoke with Dr. Weber Cooks and informed him of the bed offer with [facility] and being Voluntary verses IVC. He stated he was going to look into it.

## 2016-02-08 NOTE — ED Provider Notes (Signed)
-----------------------------------------   6:47 PM on 02/08/2016 -----------------------------------------  [facility].   Nance Pear, MD 02/08/16 [phone]

## 2016-02-08 NOTE — ED Notes (Signed)
Breakfast was given to patient. 

## 2016-02-08 NOTE — Progress Notes (Signed)
CSW sent the following referrals via fax for inpatient geriatric psych:  Scott [name] [phone])  Scott [name] [phone])  Scott [name] [phone])  Scott [name] [phone])  Scott [name] [phone])  Scott [name], MSW, [place]

## 2016-02-08 NOTE — Consult Note (Signed)
  Psychiatry: Follow-up for this patient with schizoaffective disorder. He continues to be somewhat loud and intrusive but doesn't seem quite as labile as before. Patient has been accepted to a geriatric psychiatry unit. Transportation has been arranged. Patient will continue current medicine until transferred to alternate facility. No other change to treatment at this time.

## 2016-02-08 NOTE — BH Assessment (Signed)
Patient has been accepted to [facility].  Patient assigned to the [facility] Accepting physician is Dr. Murvin Donning.  Call report to [phone].  Representative was Jenny Reichmann.  ER Staff is aware of it Lorenda Ishihara, ER Sect.; Dr. Archie Balboa, ER MD & Terrence Dupont, Patient's Nurse)    Patient Group Home was made aware [phone]) have been updated as well.

## 2016-02-08 NOTE — ED Notes (Signed)
Transportation has arrived for patient. Patient give belongings. Changed into blue paper scrubs.

## 2016-02-08 NOTE — ED Notes (Signed)
Patient in shower 

## 2016-03-23 ENCOUNTER — Emergency Department (HOSPITAL_COMMUNITY): Payer: Medicare Other

## 2016-03-23 ENCOUNTER — Emergency Department (HOSPITAL_COMMUNITY)
Admission: EM | Admit: 2016-03-23 | Discharge: 2016-03-23 | Disposition: A | Discharge status: Home / Self Care | Payer: Medicare Other | Attending: Emergency Medicine | Admitting: Emergency Medicine

## 2016-03-23 ENCOUNTER — Encounter (HOSPITAL_COMMUNITY): Payer: Self-pay | Admitting: Emergency Medicine

## 2016-03-23 DIAGNOSIS — Z23 Encounter for immunization: Secondary | ICD-10-CM | POA: Insufficient documentation

## 2016-03-23 DIAGNOSIS — S0101XA Laceration without foreign body of scalp, initial encounter: Secondary | ICD-10-CM | POA: Diagnosis not present

## 2016-03-23 DIAGNOSIS — Z7984 Long term (current) use of oral hypoglycemic drugs: Secondary | ICD-10-CM | POA: Diagnosis not present

## 2016-03-23 DIAGNOSIS — E119 Type 2 diabetes mellitus without complications: Secondary | ICD-10-CM | POA: Diagnosis not present

## 2016-03-23 DIAGNOSIS — Y999 Unspecified external cause status: Secondary | ICD-10-CM | POA: Insufficient documentation

## 2016-03-23 DIAGNOSIS — F17210 Nicotine dependence, cigarettes, uncomplicated: Secondary | ICD-10-CM | POA: Insufficient documentation

## 2016-03-23 DIAGNOSIS — S0990XA Unspecified injury of head, initial encounter: Secondary | ICD-10-CM

## 2016-03-23 DIAGNOSIS — Y939 Activity, unspecified: Secondary | ICD-10-CM | POA: Insufficient documentation

## 2016-03-23 DIAGNOSIS — Y92009 Unspecified place in unspecified non-institutional (private) residence as the place of occurrence of the external cause: Secondary | ICD-10-CM | POA: Insufficient documentation

## 2016-03-23 DIAGNOSIS — I10 Essential (primary) hypertension: Secondary | ICD-10-CM | POA: Insufficient documentation

## 2016-03-23 DIAGNOSIS — Z79899 Other long term (current) drug therapy: Secondary | ICD-10-CM | POA: Insufficient documentation

## 2016-03-23 MED ORDER — TETANUS-DIPHTH-ACELL PERTUSSIS 5-2.5-18.5 LF-MCG/0.5 IM SUSP
0.5000 mL | Freq: Once | INTRAMUSCULAR | Status: AC
  Administered 2016-03-23: 0.5 mL via INTRAMUSCULAR
  Filled 2016-03-23: qty 0.5

## 2016-03-23 NOTE — ED Notes (Signed)
Pt complains of hunger- no meals available- staff salad provided
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
## 2016-03-23 NOTE — Discharge Instructions (Signed)
Keep scalp wounds clean and dry for 24 hours. Then bathe as normal. Staple removal in 7-10 days. Return earlier for any new or worse symptoms.
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
## 2016-03-23 NOTE — ED Notes (Addendum)
Call to [facility] who is sending someone to get pt - phone number [phone]

## 2016-06-01 ENCOUNTER — Emergency Department (HOSPITAL_COMMUNITY)
Admission: EM | Admit: 2016-06-01 | Discharge: 2016-06-01 | Disposition: A | Discharge status: Home / Self Care | Payer: Medicare Other | Attending: Emergency Medicine | Admitting: Emergency Medicine

## 2016-06-01 ENCOUNTER — Encounter (HOSPITAL_COMMUNITY): Payer: Self-pay | Admitting: Emergency Medicine

## 2016-06-01 DIAGNOSIS — Z79899 Other long term (current) drug therapy: Secondary | ICD-10-CM | POA: Insufficient documentation

## 2016-06-01 DIAGNOSIS — Z7984 Long term (current) use of oral hypoglycemic drugs: Secondary | ICD-10-CM | POA: Diagnosis not present

## 2016-06-01 DIAGNOSIS — N390 Urinary tract infection, site not specified: Secondary | ICD-10-CM | POA: Insufficient documentation

## 2016-06-01 DIAGNOSIS — E119 Type 2 diabetes mellitus without complications: Secondary | ICD-10-CM | POA: Insufficient documentation

## 2016-06-01 DIAGNOSIS — I10 Essential (primary) hypertension: Secondary | ICD-10-CM | POA: Diagnosis not present

## 2016-06-01 DIAGNOSIS — R350 Frequency of micturition: Secondary | ICD-10-CM | POA: Diagnosis present

## 2016-06-01 DIAGNOSIS — F17210 Nicotine dependence, cigarettes, uncomplicated: Secondary | ICD-10-CM | POA: Diagnosis not present

## 2016-06-01 LAB — URINALYSIS, ROUTINE W REFLEX MICROSCOPIC
Bilirubin Urine: NEGATIVE
GLUCOSE, UA: NEGATIVE mg/dL
Hgb urine dipstick: NEGATIVE
KETONES UR: 5 mg/dL — AB
Nitrite: NEGATIVE
PH: 5 (ref 5.0–8.0)
Protein, ur: NEGATIVE mg/dL
SPECIFIC GRAVITY, URINE: 1.014 (ref 1.005–1.030)

## 2016-06-01 MED ORDER — CEPHALEXIN 500 MG PO CAPS
500.0000 mg | ORAL_CAPSULE | Freq: Once | ORAL | Status: AC
  Administered 2016-06-01: 500 mg via ORAL
  Filled 2016-06-01: qty 1

## 2016-06-01 MED ORDER — CEPHALEXIN 500 MG PO CAPS: 500.0000 mg | ORAL_CAPSULE | Freq: Four times a day (QID) | ORAL | 0 refills | Status: DC

## 2016-06-01 NOTE — Discharge Instructions (Signed)
Make sure he drinks plenty of water and takes the antibiotic as directed until its finished.  Follow-up with his doctor for recheck

## 2016-06-01 NOTE — ED Triage Notes (Signed)
Pt get UTI frequently, burning when voiding x 1 week. caregiver with pt

## 2016-06-05 NOTE — ED Provider Notes (Signed)
[place] DEPT Provider Note   CSN: [id number] Arrival date & time: 06/01/16  1148     History   Chief Complaint Chief Complaint  Patient presents with  . Urinary Frequency    HPI Scott [name] is a 66 y.o. male.  HPI  Scott [name] is a 66 y.o. male who presents to the Emergency Department with his caregiver, resides at a local assisted living facility.  He has hx of schizophrenia, diabetes, and dementia  Caregiver states that he has a hx of recurrent UTI's.  Was previously on low dose, long term antibiotic use but d/c by his PMD.  He reports malodorous urine and increased frequency.  He denies pain, fever, chills, vomiting and penile discharge.  Past Medical History:  Diagnosis Date  . Anxiety   . Chronic hyponatremia 05/2013, 11/2013  . Dementia   . Dyslipidemia   . GERD (gastroesophageal reflux disease)   . Hypertension   . Hypoglycemia   . Schizophrenia ([place])   . Seizures ([place])   . Syncope   . Tardive dyskinesia   . Urinary tract infection   . Vitamin D deficiency     Patient Active Problem List   Diagnosis Date Noted  . Schizoaffective disorder, bipolar type ([place]) 02/07/2016  . Diabetes ([place]) 02/07/2016  . Schizophrenia ([place]) 01/24/2016  . GERD (gastroesophageal reflux disease) 01/24/2016  . Tardive dyskinesia 01/24/2016  . Chronic hyponatremia 01/24/2016  . Hypertension 01/24/2016    Past Surgical History:  Procedure Laterality Date  . CATARACT EXTRACTION W/PHACO Left 01/10/2016   Procedure: CATARACT EXTRACTION PHACO AND INTRAOCULAR LENS PLACEMENT (IOC);  Surgeon: Birder Robson, MD;  Location: [facility] ORS;  Service: Ophthalmology;  Laterality: Left;  [place] 00:53 AP% 24.2 CDE 12.88 fluid pack lot # [id number]  . EYE SURGERY    . FOOT SURGERY         Home Medications    Prior to Admission medications   Medication Sig Start Date End Date Taking? Authorizing Provider  acetaminophen (TYLENOL) 500 MG tablet Take 500 mg by mouth every 6 (six)  hours as needed.    Historical Provider, MD  amLODipine (NORVASC) 10 MG tablet Take 10 mg by mouth daily.    Historical Provider, MD  atorvastatin (LIPITOR) 20 MG tablet Take 20 mg by mouth daily.    Historical Provider, MD  Bromfenac Sodium (BROMSITE) 0.075 % SOLN Place 1 drop into the right eye 2 (two) times daily.    Historical Provider, MD  cephALEXin (KEFLEX) 500 MG capsule Take 1 capsule (500 mg total) by mouth 4 (four) times daily. For 7 days 06/01/16   Nnenna Meador, PA-C  cetirizine (ZYRTEC) 10 MG tablet Take 10 mg by mouth daily.    Historical Provider, MD  Difluprednate (DUREZOL) 0.05 % EMUL Place 1 drop into the right eye 2 (two) times daily.    Historical Provider, MD  divalproex (DEPAKOTE) 500 MG DR tablet Take 2,000 mg by mouth at bedtime.    Historical Provider, MD  docusate sodium (COLACE) 100 MG capsule Take 100 mg by mouth 2 (two) times daily.    Historical Provider, MD  metFORMIN (GLUCOPHAGE) 500 MG tablet Take by mouth 2 (two) times daily with a meal.    Historical Provider, MD  metoprolol succinate (TOPROL-XL) 50 MG 24 hr tablet Take 50 mg by mouth 2 (two) times daily. Take with or immediately following a meal.     Historical Provider, MD  mirtazapine (REMERON) 15 MG tablet Take 15 mg by mouth  at bedtime.    Historical Provider, MD  pantoprazole (PROTONIX) 40 MG tablet Take 40 mg by mouth 2 (two) times daily.    Historical Provider, MD  QUEtiapine (SEROQUEL) 200 MG tablet Take 200 mg by mouth at bedtime.    Historical Provider, MD  QUEtiapine (SEROQUEL) 25 MG tablet Take 25 mg by mouth 3 (three) times daily. Care giver states he also takes Seroquel 200mg  at bedtime    Historical Provider, MD  Skin Protectants, Misc. (BAZA PROTECT EX) Apply 1 application topically daily as needed.    Historical Provider, MD  sodium chloride 1 G tablet Take 1 g by mouth 4 (four) times daily.     Historical Provider, MD  tamsulosin (FLOMAX) 0.4 MG CAPS capsule Take 0.4 mg by mouth daily after  breakfast.    Historical Provider, MD  trihexyphenidyl (ARTANE) 5 MG tablet Take 5 mg by mouth 2 (two) times daily.    Historical Provider, MD    Family History No family history on file.  Social History Social History  Substance Use Topics  . Smoking status: Current Every Day Smoker    Packs/day: 0.50    Types: Cigarettes  . Smokeless tobacco: Never Used  . Alcohol use No     Allergies   Patient has no known allergies.   Review of Systems Review of Systems  Constitutional: Negative for activity change, appetite change, chills and fever.  Respiratory: Negative for chest tightness and shortness of breath.   Cardiovascular: Negative for chest pain.  Gastrointestinal: Negative for abdominal pain, nausea and vomiting.  Genitourinary: Positive for dysuria, frequency and urgency. Negative for decreased urine volume, difficulty urinating, discharge, flank pain, hematuria, penile pain, penile swelling, scrotal swelling and testicular pain.  Musculoskeletal: Negative for back pain.  Skin: Negative for rash.  Neurological: Negative for dizziness, weakness and numbness.  Hematological: Negative for adenopathy.  All other systems reviewed and are negative.    Physical Exam Updated Vital Signs BP 116/79 (BP Location: Left Arm)   Pulse 76   Temp 98.3 F (36.8 C) (Oral)   Resp 18   Ht 6\' 3"  (1.905 m)   Wt 104.3 kg   SpO2 100%   BMI 28.75 kg/m   Physical Exam  Constitutional: He is oriented to person, place, and time. He appears well-developed and well-nourished. No distress.  HENT:  Head: Normocephalic and atraumatic.  Cardiovascular: Normal rate, regular rhythm and intact distal pulses.   No murmur heard. Pulmonary/Chest: Effort normal and breath sounds normal. No respiratory distress. He has no wheezes. He has no rales.  Abdominal: Soft. Normal appearance. He exhibits no distension and no mass. There is no hepatosplenomegaly. There is no tenderness. There is no rigidity,  no rebound, no guarding, no CVA tenderness and no tenderness at McBurney's point.  No CVA tenderness  Musculoskeletal: Normal range of motion. He exhibits no edema.  Neurological: He is alert and oriented to person, place, and time. Coordination normal.  Skin: Skin is warm and dry. No rash noted.  Nursing note and vitals reviewed.    ED Treatments / Results  Labs (all labs ordered are listed, but only abnormal results are displayed) Labs Reviewed  URINE CULTURE - Abnormal; Notable for the following:       Result Value   Culture >=100,000 COLONIES/mL ENTEROCOCCUS FAECALIS (*)    Organism ID, Bacteria ENTEROCOCCUS FAECALIS (*)    All other components within normal limits  URINALYSIS, ROUTINE W REFLEX MICROSCOPIC - Abnormal; Notable for the following:  APPearance HAZY (*)    Ketones, ur 5 (*)    Leukocytes, UA MODERATE (*)    Bacteria, UA FEW (*)    All other components within normal limits    EKG  EKG Interpretation None       Radiology No results found.  Procedures Procedures (including critical care time)  Medications Ordered in ED Medications  cephALEXin (KEFLEX) capsule 500 mg (500 mg Oral Given 06/01/16 1359)     Initial Impression / Assessment and Plan / ED Course  I have reviewed the triage vital signs and the nursing notes.  Pertinent labs & imaging results that were available during my care of the patient were reviewed by me and considered in my medical decision making (see chart for details).  Clinical Course     Pt non-toxic appearing.  Vitals stable.  No fever, abd pain, CVA tenderness or vomting to suggest pyelonephritis.  Will rx keflex, culture pending.  Agrees to PMD f/u.  Appears stable for d/c  Final Clinical Impressions(s) / ED Diagnoses   Final diagnoses:  Urinary tract infection in male    New Prescriptions Discharge Medication List as of 06/01/2016  1:55 PM    START taking these medications   Details  cephALEXin (KEFLEX) 500 MG  capsule Take 1 capsule (500 mg total) by mouth 4 (four) times daily. For 7 days, Starting Fri 06/01/2016, [place], PA-C 06/05/16 2211    Nat Christen, MD 06/06/16 [phone]

## 2016-06-07 LAB — URINE CULTURE: Culture: >=100000 — AB

## 2016-06-08 ENCOUNTER — Emergency Department
Admission: EM | Admit: 2016-06-08 | Discharge: 2016-06-08 | Disposition: A | Discharge status: Home / Self Care | Payer: Medicare Other | Attending: Emergency Medicine | Admitting: Emergency Medicine

## 2016-06-08 ENCOUNTER — Telehealth: Payer: Self-pay

## 2016-06-08 ENCOUNTER — Encounter: Payer: Self-pay | Admitting: Emergency Medicine

## 2016-06-08 DIAGNOSIS — Z79899 Other long term (current) drug therapy: Secondary | ICD-10-CM | POA: Insufficient documentation

## 2016-06-08 DIAGNOSIS — I10 Essential (primary) hypertension: Secondary | ICD-10-CM | POA: Insufficient documentation

## 2016-06-08 DIAGNOSIS — Z7984 Long term (current) use of oral hypoglycemic drugs: Secondary | ICD-10-CM | POA: Diagnosis not present

## 2016-06-08 DIAGNOSIS — F17210 Nicotine dependence, cigarettes, uncomplicated: Secondary | ICD-10-CM | POA: Diagnosis not present

## 2016-06-08 DIAGNOSIS — R739 Hyperglycemia, unspecified: Secondary | ICD-10-CM

## 2016-06-08 DIAGNOSIS — E1165 Type 2 diabetes mellitus with hyperglycemia: Secondary | ICD-10-CM | POA: Insufficient documentation

## 2016-06-08 LAB — BASIC METABOLIC PANEL
Anion gap: 11 (ref 5–15)
BUN: 15 mg/dL (ref 6–20)
CALCIUM: 9.2 mg/dL (ref 8.9–10.3)
CHLORIDE: 99 mmol/L — AB (ref 101–111)
CO2: 20 mmol/L — ABNORMAL LOW (ref 22–32)
CREATININE: 1.09 mg/dL (ref 0.61–1.24)
Glucose, Bld: 245 mg/dL — ABNORMAL HIGH (ref 65–99)
Potassium: 4.8 mmol/L (ref 3.5–5.1)
SODIUM: 130 mmol/L — AB (ref 135–145)

## 2016-06-08 LAB — CBC
HCT: 35.3 % — ABNORMAL LOW (ref 40.0–52.0)
Hemoglobin: 11.9 g/dL — ABNORMAL LOW (ref 13.0–18.0)
MCH: 30.4 pg (ref 26.0–34.0)
MCHC: 33.7 g/dL (ref 32.0–36.0)
MCV: 90.3 fL (ref 80.0–100.0)
PLATELETS: 218 10*3/uL (ref 150–440)
RBC: 3.91 MIL/uL — AB (ref 4.40–5.90)
RDW: 15.8 % — AB (ref 11.5–14.5)
WBC: 5.1 10*3/uL (ref 3.8–10.6)

## 2016-06-08 LAB — URINALYSIS, COMPLETE (UACMP) WITH MICROSCOPIC
BILIRUBIN URINE: NEGATIVE
Bacteria, UA: NONE SEEN
Hgb urine dipstick: NEGATIVE
KETONES UR: 5 mg/dL — AB
LEUKOCYTES UA: NEGATIVE
NITRITE: NEGATIVE
PH: 5 (ref 5.0–8.0)
Protein, ur: NEGATIVE mg/dL
SPECIFIC GRAVITY, URINE: 1.021 (ref 1.005–1.030)

## 2016-06-08 LAB — GLUCOSE, CAPILLARY
GLUCOSE-CAPILLARY: 300 mg/dL — AB (ref 65–99)
Glucose-Capillary: 224 mg/dL — ABNORMAL HIGH (ref 65–99)

## 2016-06-08 MED ORDER — INSULIN ASPART 100 UNIT/ML ~~LOC~~ SOLN
3.0000 [IU] | Freq: Once | SUBCUTANEOUS | Status: AC
  Administered 2016-06-08: 3 [IU] via INTRAVENOUS
  Filled 2016-06-08: qty 3

## 2016-06-08 MED ORDER — METFORMIN HCL 500 MG PO TABS
500.0000 mg | ORAL_TABLET | Freq: Once | ORAL | Status: AC
  Administered 2016-06-08: 500 mg via ORAL

## 2016-06-08 MED ORDER — METFORMIN HCL 850 MG PO TABS: 850.0000 mg | ORAL_TABLET | Freq: Two times a day (BID) | ORAL | 0 refills | Status: DC

## 2016-06-08 MED ORDER — SODIUM CHLORIDE 0.9 % IV BOLUS (SEPSIS)
500.0000 mL | Freq: Once | INTRAVENOUS | Status: AC
  Administered 2016-06-08: 500 mL via INTRAVENOUS

## 2016-06-08 NOTE — ED Notes (Signed)
Patient with CBG of 300 on last check just prior to shift change. This demonstrates an INCREASE following insulin and IVF administration. PCO discussed with MD. Patient due to Glucophage 500mg  dose at 1700; was here in the ED. MD with [facility] to give patient normal 500mg  dose of Glucophage now. Order to be carried by this RN.

## 2016-06-08 NOTE — Progress Notes (Signed)
ED Antimicrobial Stewardship Positive Culture Follow Up   Scott [name] is an 66 y.o. male who presented to [facility] on 06/01/2016 with a chief complaint of  Chief Complaint  Patient presents with  . Urinary Frequency    Recent Results (from the past 720 hour(s))  Urine culture     Status: Abnormal   Collection Time: 06/01/16 12:55 PM  Result Value Ref Range Status   Specimen Description URINE, CLEAN CATCH  Final   Special Requests NONE  Final   Culture (A)  Final    >=100,000 COLONIES/mL ENTEROCOCCUS FAECALIS >=100,000 COLONIES/mL VIRIDANS STREPTOCOCCUS    Report Status 06/07/2016 FINAL  Final   Organism ID, Bacteria ENTEROCOCCUS FAECALIS (A)  Final      Susceptibility   Enterococcus faecalis - MIC*    AMPICILLIN <=2 SENSITIVE Sensitive     LEVOFLOXACIN 0.5 SENSITIVE Sensitive     NITROFURANTOIN 128 RESISTANT Resistant     VANCOMYCIN <=0.5 SENSITIVE Sensitive     * >=100,000 COLONIES/mL ENTEROCOCCUS FAECALIS    [x]  Treated with cephalexin, organism resistant to prescribed antimicrobial  New antibiotic prescription: Amoxicillin 500mg  capsule PO twice daily for 7 days  ED Provider: Sula Rumple, PA-C  Dierdre Harness, BS, PharmD Clinical Pharmacy Resident [phone] (Pager) 06/08/2016 9:02 AM

## 2016-06-08 NOTE — ED Provider Notes (Signed)
Time Seen: Approximately 1800  I have reviewed the triage notes  Chief Complaint: Hyperglycemia   History of Present Illness: Scott [name] is a 66 y.o. male *who has a history of recurrent urinary tract infections, schizophrenia, dementia, etc. Patient was recently seen and evaluated at [facility] on 1229. He was diagnosed with a urinary tract infection and urine culture was performed the patient was discharged on Keflex. According to the caretaker apparently the antibiotic has been changed after return of the culture results. Patient was referred  here due to concerns of possible diabetic ketoacidosis. Patient otherwise feels fine and denies any burning with urination, fever, nausea, vomiting. Calculated anion gap was 16 with exam of the urinary results showing a small amount of ketones. Bicarbonate was 17 on review of previous laboratory work.  Past Medical History:  Diagnosis Date  . Anxiety   . Chronic hyponatremia 05/2013, 11/2013  . Dementia   . Dyslipidemia   . GERD (gastroesophageal reflux disease)   . Hypertension   . Hypoglycemia   . Schizophrenia ([place])   . Seizures ([place])   . Syncope   . Tardive dyskinesia   . Urinary tract infection   . Vitamin D deficiency     Patient Active Problem List   Diagnosis Date Noted  . Schizoaffective disorder, bipolar type ([place]) 02/07/2016  . Diabetes ([place]) 02/07/2016  . Schizophrenia ([place]) 01/24/2016  . GERD (gastroesophageal reflux disease) 01/24/2016  . Tardive dyskinesia 01/24/2016  . Chronic hyponatremia 01/24/2016  . Hypertension 01/24/2016    Past Surgical History:  Procedure Laterality Date  . CATARACT EXTRACTION W/PHACO Left 01/10/2016   Procedure: CATARACT EXTRACTION PHACO AND INTRAOCULAR LENS PLACEMENT (IOC);  Surgeon: Birder Robson, MD;  Location: [facility] ORS;  Service: Ophthalmology;  Laterality: Left;  [place] 00:53 AP% 24.2 CDE 12.88 fluid pack lot # [id number]  . EYE SURGERY    . FOOT SURGERY      Past  Surgical History:  Procedure Laterality Date  . CATARACT EXTRACTION W/PHACO Left 01/10/2016   Procedure: CATARACT EXTRACTION PHACO AND INTRAOCULAR LENS PLACEMENT (IOC);  Surgeon: Birder Robson, MD;  Location: [facility] ORS;  Service: Ophthalmology;  Laterality: Left;  [place] 00:53 AP% 24.2 CDE 12.88 fluid pack lot # [id number]  . EYE SURGERY    . FOOT SURGERY      Current Outpatient Rx  . Order #: [id number] Class: Historical Med  . Order #: [id number] Class: Historical Med  . Order #: [id number] Class: Historical Med  . Order #: [id number] Class: Historical Med  . Order #: [id number] Class: Print  . Order #: [id number] Class: Historical Med  . Order #: [id number] Class: Historical Med  . Order #: [id number] Class: Historical Med  . Order #: [id number] Class: Historical Med  . Order #: [id number] Class: Historical Med  . Order #: [id number] Class: Historical Med  . Order #: [id number] Class: Historical Med  . Order #: [id number] Class: Historical Med  . Order #: [id number] Class: Historical Med  . Order #: [id number] Class: Historical Med  . Order #: [id number] Class: Historical Med  . Order #: [id number] Class: Historical Med  . Order #: [id number] Class: Historical Med  . Order #: [id number] Class: Historical Med    Allergies:  Patient has no known allergies.  Family History: No family history on file.  Social History: Social History  Substance Use Topics  . Smoking status: Current Every Day Smoker    Packs/day: 0.50    Types: Cigarettes  . Smokeless tobacco: Never Used  . Alcohol use No     Review of Systems:  10 point review of systems was performed and was otherwise negative:Review of systems taken through the patient along with his caretaker. Constitutional: No fever Eyes: No visual disturbances ENT: No sore throat, ear pain Cardiac: No chest pain Respiratory: No shortness of breath, wheezing, or stridor Abdomen: No abdominal pain, no vomiting, No diarrhea Endocrine: No weight loss, No night  sweats Extremities: No peripheral edema, cyanosis Skin: No rashes, easy bruising Neurologic: No focal weakness, trouble with speech or swollowing Urologic: Urinary frequency without hematuria or dysuria   Physical Exam:  ED Triage Vitals  Enc Vitals Group     BP 06/08/16 1424 113/63     Pulse Rate 06/08/16 1424 80     Resp 06/08/16 1424 16     Temp 06/08/16 1424 98.2 F (36.8 C)     Temp Source 06/08/16 1424 Oral     SpO2 06/08/16 1424 98 %     Weight 06/08/16 1421 230 lb (104.3 kg)     Height --      Head Circumference --      Peak Flow --      Pain Score --      Pain Loc --      Pain Edu? --      Excl. in [place]? --     General: Awake , Alert , and Oriented times 3; GCS 15 Head: Normal cephalic , atraumatic Eyes: Pupils equal , round, reactive to light Nose/Throat: No nasal drainage, patent upper airway without erythema or exudate.  Neck: Supple, Full range of motion, No anterior adenopathy or palpable thyroid masses Lungs: Clear to ascultation without wheezes , rhonchi, or rales Heart: Regular rate, regular rhythm without murmurs , gallops , or rubs Abdomen: Soft, non tender without rebound, guarding , or rigidity; bowel sounds positive and symmetric in all 4 quadrants. No organomegaly .        Extremities: 2 plus symmetric pulses. No edema, clubbing or cyanosis Neurologic: normal ambulation, Motor symmetric without deficits, sensory intact Skin: warm, dry, no rashes   Labs:   All laboratory work was reviewed including any pertinent negatives or positives listed below:  Labs Reviewed  BASIC METABOLIC PANEL - Abnormal; Notable for the following:       Result Value   Sodium 130 (*)    Chloride 99 (*)    CO2 20 (*)    Glucose, Bld 245 (*)    All other components within normal limits  CBC - Abnormal; Notable for the following:    RBC 3.91 (*)    Hemoglobin 11.9 (*)    HCT 35.3 (*)    RDW 15.8 (*)    All other components within normal limits  GLUCOSE, CAPILLARY -  Abnormal; Notable for the following:    Glucose-Capillary 224 (*)    All other components within normal limits  URINALYSIS, COMPLETE (UACMP) WITH MICROSCOPIC  CBG MONITORING, ED  Anion gap is 11. Bicarbonate level was normal  ED Course:  There appeared to be some improvement in his laboratory work from 1229. He still has a small number of ketones in his urine but his anion gap and bicarbonate shown improvement. His urine also appears to be clearing on the antibiotic. He was given an IV fluid bolus here in emergency department and low-dose insulin. His blood sugar actually went up a slight amount up to 300 and we realized he had not had his evening dosage of his metformin at this point. Patient was given the metformin here him be  discharged with a slight increase in his metformin dosages on an outpatient basis. He was increased from 500 mg twice a day to 850 mg twice a day. The patient is afebrile and the caretaker felt comfortable with discharge and was given a copy of his laboratory work to take with him. Clinical Course      Assessment:  Hyperglycemia Resolving mild diabetic ketoacidosis      Plan: * Patient was advised to return immediately if condition worsens. Patient was advised to follow up with their primary care physician or other specialized physicians involved in their outpatient care. The patient and/or family member/power of attorney had laboratory results reviewed at the bedside. All questions and concerns were addressed and appropriate discharge instructions were distributed by the nursing staff.            Daymon Larsen, MD 06/08/16 2001

## 2016-06-08 NOTE — ED Notes (Signed)

## 2016-06-08 NOTE — Telephone Encounter (Signed)
Post ED Visit - Positive Culture Follow-up: Successful Patient Follow-Up  Culture assessed and recommendations reviewed by: []  Elenor Quinones, Pharm.D. []  Heide Guile, Pharm.D., BCPS []  Parks Neptune, Pharm.D. []  Alycia Rossetti, Pharm.D., BCPS []  [place], [place].D., BCPS, AAHIVP []  Legrand Como, Pharm.D., BCPS, AAHIVP []  Milus Glazier, Pharm.D. []  [place]s [place], [place].D. J Arminger Pharm D Positive urine culture  []  Patient discharged without antimicrobial prescription and treatment is now indicated [x]  Organism is resistant to prescribed ED discharge antimicrobial []  Patient with positive blood cultures  Changes discussed with ED provider: Will Dansie Pa-c New antibiotic prescription Amoxicillin 500mg  BID x7 days Called to [facility] [phone]  Contacted patient, date 06/08/16, time [place], Carolynn Comment 06/08/2016, 11:56 AM

## 2016-06-08 NOTE — ED Triage Notes (Signed)
Patient to ED with caregiver, patient was seen at [facility] on 12/29, patient was told by his PCP that he needed to come to the ED to be evaluated for Ketoacidosis. Per note that PCP sent patient is spilling ketones in his urine and has an Anion Gap is 16. Patient does not appear to be in any distress at this time, breathing is equal and unlabored, color WNL.

## 2016-06-08 NOTE — ED Notes (Signed)
PO fluids and pm snack provided per request. Patient pending discharge.

## 2016-06-08 NOTE — Discharge Instructions (Signed)
Please continue to drink plenty fluids and continue all of your other medications.  We does increase the Glucophage to 850 mg twice a day from 500 mg twice a day. Please closely tract blood sugars and continue with normal diet. Please contact her primary physician and inform them of the emergency department visit. Return to emergency department especially for fever, vomiting, increasing urination, or any other new concerns  Please return immediately if condition worsens. Please contact her primary physician or the physician you were given for referral. If you have any specialist physicians involved in her treatment and plan please also contact them. Thank you for using [facility] emergency [facility].

## 2016-06-08 NOTE — ED Notes (Signed)
Toileting offered, pt unable to void at this time, MD made aware.

## 2016-06-26 ENCOUNTER — Other Ambulatory Visit: Payer: Self-pay | Admitting: Family Medicine

## 2017-03-27 ENCOUNTER — Encounter (HOSPITAL_COMMUNITY): Payer: Self-pay

## 2017-03-27 ENCOUNTER — Emergency Department (HOSPITAL_COMMUNITY)
Admission: EM | Admit: 2017-03-27 | Discharge: 2017-03-27 | Disposition: A | Discharge status: Home / Self Care | Payer: Medicare Other | Attending: Emergency Medicine | Admitting: Emergency Medicine

## 2017-03-27 ENCOUNTER — Emergency Department (HOSPITAL_COMMUNITY): Payer: Medicare Other

## 2017-03-27 DIAGNOSIS — Z7984 Long term (current) use of oral hypoglycemic drugs: Secondary | ICD-10-CM | POA: Diagnosis not present

## 2017-03-27 DIAGNOSIS — I10 Essential (primary) hypertension: Secondary | ICD-10-CM | POA: Insufficient documentation

## 2017-03-27 DIAGNOSIS — Z79899 Other long term (current) drug therapy: Secondary | ICD-10-CM | POA: Insufficient documentation

## 2017-03-27 DIAGNOSIS — R509 Fever, unspecified: Secondary | ICD-10-CM | POA: Diagnosis not present

## 2017-03-27 DIAGNOSIS — R4182 Altered mental status, unspecified: Secondary | ICD-10-CM | POA: Diagnosis present

## 2017-03-27 DIAGNOSIS — E119 Type 2 diabetes mellitus without complications: Secondary | ICD-10-CM | POA: Insufficient documentation

## 2017-03-27 DIAGNOSIS — E860 Dehydration: Secondary | ICD-10-CM

## 2017-03-27 LAB — BASIC METABOLIC PANEL
Anion gap: 7 (ref 5–15)
BUN: 22 mg/dL — ABNORMAL HIGH (ref 6–20)
CO2: 22 mmol/L (ref 22–32)
Calcium: 9 mg/dL (ref 8.9–10.3)
Chloride: 100 mmol/L — ABNORMAL LOW (ref 101–111)
Creatinine, Ser: 1.56 mg/dL — ABNORMAL HIGH (ref 0.61–1.24)
GFR calc Af Amer: 52 mL/min — ABNORMAL LOW (ref >60)
GFR calc non Af Amer: 45 mL/min — ABNORMAL LOW (ref >60)
Glucose, Bld: 135 mg/dL — ABNORMAL HIGH (ref 65–99)
Potassium: 4.7 mmol/L (ref 3.5–5.1)
Sodium: 129 mmol/L — ABNORMAL LOW (ref 135–145)

## 2017-03-27 LAB — CBC WITH DIFFERENTIAL/PLATELET
Basophils Absolute: 0 10*3/uL (ref 0.0–0.1)
Basophils Relative: 0 %
Eosinophils Absolute: 0 10*3/uL (ref 0.0–0.7)
Eosinophils Relative: 0 %
HCT: 32 % — ABNORMAL LOW (ref 39.0–52.0)
Hemoglobin: 10.9 g/dL — ABNORMAL LOW (ref 13.0–17.0)
Lymphocytes Relative: 38 %
Lymphs Abs: 1.8 10*3/uL (ref 0.7–4.0)
MCH: 30.3 pg (ref 26.0–34.0)
MCHC: 34.1 g/dL (ref 30.0–36.0)
MCV: 88.9 fL (ref 78.0–100.0)
Monocytes Absolute: 0.4 10*3/uL (ref 0.1–1.0)
Monocytes Relative: 8 %
Neutro Abs: 2.5 10*3/uL (ref 1.7–7.7)
Neutrophils Relative %: 54 %
Platelets: 199 10*3/uL (ref 150–400)
RBC: 3.6 MIL/uL — ABNORMAL LOW (ref 4.22–5.81)
RDW: 14.5 % (ref 11.5–15.5)
WBC: 4.7 10*3/uL (ref 4.0–10.5)

## 2017-03-27 LAB — URINALYSIS, ROUTINE W REFLEX MICROSCOPIC
Bilirubin Urine: NEGATIVE
Glucose, UA: NEGATIVE mg/dL
Hgb urine dipstick: NEGATIVE
Ketones, ur: NEGATIVE mg/dL
Leukocytes, UA: NEGATIVE
Nitrite: NEGATIVE
Protein, ur: 30 mg/dL — AB
RBC / HPF: NONE SEEN RBC/hpf (ref 0–5)
Specific Gravity, Urine: 1.017 (ref 1.005–1.030)
Squamous Epithelial / LPF: NONE SEEN
pH: 7 (ref 5.0–8.0)

## 2017-03-27 MED ORDER — SODIUM CHLORIDE 0.9 % IV BOLUS (SEPSIS)
1000.0000 mL | Freq: Once | INTRAVENOUS | Status: AC
  Administered 2017-03-27: 1000 mL via INTRAVENOUS

## 2017-03-27 NOTE — ED Triage Notes (Signed)
Pt brought in by [facility]. Called out due to unconscious. Upon arrival pt was sitting upright in a chair and slightly responsive. Given an ammonia inhalant and started arousing more. EMS states the group home where he lives was extremely hot. Pt was warm to the touch upon EMS arrival. Pt was dry and not diaphoretic  Pt is a diabetic. CBG upon arrival was 218.

## 2017-03-27 NOTE — ED Notes (Signed)
Given 3500L of fluid by EMS

## 2017-03-27 NOTE — ED Provider Notes (Signed)
[facility] EMERGENCY DEPARTMENT Provider Note   CSN: [id number] Arrival date & time: 03/27/17  1804     History   Chief Complaint Chief Complaint  Patient presents with  . Altered Mental Status    HPI Scott [name] is a 66 y.o. male.  HPI   44yM with decreased mental status, possibly syncope. Found sitting slumped forward in group home. EMS reports that home was very hot. Pt awake but drowsy on their arrival. Pt arrived to ED more alert. Denies any pain. NO nausea. No respiratory complaints. No cough. Appears to be at baseline now.   Past Medical History:  Diagnosis Date  . Anxiety   . Chronic hyponatremia 05/2013, 11/2013  . Dementia   . Dyslipidemia   . GERD (gastroesophageal reflux disease)   . Hypertension   . Hypoglycemia   . Schizophrenia ([place])   . Seizures ([place])   . Syncope   . Tardive dyskinesia   . Urinary tract infection   . Vitamin D deficiency     Patient Active Problem List   Diagnosis Date Noted  . Schizoaffective disorder, bipolar type ([place]) 02/07/2016  . Diabetes ([place]) 02/07/2016  . Schizophrenia ([place]) 01/24/2016  . GERD (gastroesophageal reflux disease) 01/24/2016  . Tardive dyskinesia 01/24/2016  . Chronic hyponatremia 01/24/2016  . Hypertension 01/24/2016    Past Surgical History:  Procedure Laterality Date  . CATARACT EXTRACTION W/PHACO Left 01/10/2016   Procedure: CATARACT EXTRACTION PHACO AND INTRAOCULAR LENS PLACEMENT (IOC);  Surgeon: Birder Robson, MD;  Location: [facility] ORS;  Service: Ophthalmology;  Laterality: Left;  [place] 00:53 AP% 24.2 CDE 12.88 fluid pack lot # [id number]  . EYE SURGERY    . FOOT SURGERY         Home Medications    Prior to Admission medications   Medication Sig Start Date End Date Taking? Authorizing Provider  acetaminophen (TYLENOL) 500 MG tablet Take 500 mg by mouth every 6 (six) hours as needed.    [provider]  amLODipine (NORVASC) 10 MG tablet Take 10 mg by mouth daily.    [provider]  atorvastatin (LIPITOR) 20 MG tablet Take 20 mg by mouth daily.    [provider]  Bromfenac Sodium (BROMSITE) 0.075 % SOLN Place 1 drop into the right eye 2 (two) times daily.    [provider]  cephALEXin (KEFLEX) 500 MG capsule Take 1 capsule (500 mg total) by mouth 4 (four) times daily. For 7 days Patient not taking: Reported on 06/08/2016 06/01/16   Kem Parkinson, PA-C  cetirizine (ZYRTEC) 10 MG tablet Take 10 mg by mouth daily.    [provider]  Difluprednate (DUREZOL) 0.05 % EMUL Place 1 drop into the right eye 2 (two) times daily.    [provider]  divalproex (DEPAKOTE) 500 MG DR tablet Take 2,000 mg by mouth at bedtime.    [provider]  docusate sodium (COLACE) 100 MG capsule Take 100 mg by mouth 2 (two) times daily.    [provider]  metFORMIN (GLUCOPHAGE) 850 MG tablet Take 1 tablet (850 mg total) by mouth 2 (two) times daily with a meal. 06/08/16   Daymon Larsen, MD  metoprolol succinate (TOPROL-XL) 50 MG 24 hr tablet Take 50 mg by mouth daily. Take with or immediately following a meal.     [provider]  mirtazapine (REMERON) 15 MG tablet Take 15 mg by mouth at bedtime.    [provider]  nitrofurantoin, macrocrystal-monohydrate, (MACROBID) 100  MG capsule Take 100 mg by mouth daily with breakfast.    [provider]  pantoprazole (PROTONIX) 40 MG tablet Take 40 mg by mouth 2 (two) times daily.    [provider]  QUEtiapine (SEROQUEL) 200 MG tablet Take 200 mg by mouth at bedtime.    [provider]  QUEtiapine (SEROQUEL) 25 MG tablet Take 25 mg by mouth 3 (three) times daily. Care giver states he also takes Seroquel 200mg  at bedtime    [provider]  Skin Protectants, Misc. (BAZA PROTECT EX) Apply 1 application topically daily as needed.    [provider]  sodium chloride 1 G tablet Take 1 g by mouth 4 (four) times daily.     [provider]  tamsulosin (FLOMAX) 0.4 MG CAPS capsule Take 0.4 mg by mouth daily after breakfast.    [provider]  trihexyphenidyl (ARTANE) 5 MG tablet Take 5 mg by mouth 2 (two) times daily.    [provider]    Family History No family history on file.  Social History Social History  Substance Use Topics  . Smoking status: Current Every Day Smoker    Packs/day: 0.50    Types: Cigarettes  . Smokeless tobacco: Never Used  . Alcohol use No     Allergies   Patient has no known allergies.   Review of Systems Review of Systems  All systems reviewed and negative, other than as noted in HPI.  Physical Exam Updated Vital Signs There were no vitals taken for this visit.  Physical Exam  Constitutional: He appears well-developed and well-nourished. No distress.  HENT:  Head: Normocephalic and atraumatic.  Eyes: Conjunctivae are normal. Right eye exhibits no discharge. Left eye exhibits no discharge.  Neck: Neck supple.  Cardiovascular: Normal rate, regular rhythm and normal heart sounds.  Exam reveals no gallop and no friction rub.   No murmur heard. Pulmonary/Chest: Effort normal and breath sounds normal. No respiratory distress.  Abdominal: Soft. He exhibits no distension. There is no tenderness.  Genitourinary:  Genitourinary Comments: foley  Musculoskeletal: He exhibits no edema or tenderness.  Neurological: He is alert.  Somewhat difficult to understand but answering questions appropriately. Follows commands. No focal motor deficits.   Skin: Skin is warm and dry.  Psychiatric: He has a normal mood and affect. His behavior is normal. Thought content normal.  Nursing note and vitals reviewed.    ED Treatments / Results  Labs (all labs ordered are listed, but only abnormal results are displayed) Labs Reviewed - No data to display  EKG  EKG Interpretation None       Radiology No results found.  Procedures Procedures (including  critical care time)  Medications Ordered in ED Medications - No data to display   Initial Impression / Assessment and Plan / ED Course  I have reviewed the triage vital signs and the nursing notes.  Pertinent labs & imaging results that were available during my care of the patient were reviewed by me and considered in my medical decision making (see chart for details).     66yM with AMS. Now resolved. Fever. Environmental? Apparently house temp very high. He has no complaints. No leukocytosis. Urine surprisingly ok with foley. CXR clear. HD stable. Caretaker now at bedside. Confirms he is his normal self. Will better regulate thermostat. I doubt emergent process.   Final Clinical Impressions(s) / ED Diagnoses   Final diagnoses:  Fever, unspecified fever cause  Dehydration    New  Prescriptions New Prescriptions   No medications on file     Virgel Manifold, MD 04/02/17 1416

## 2017-03-27 NOTE — ED Notes (Signed)
Pt to xray

## 2017-03-28 LAB — CBG MONITORING, ED: Glucose-Capillary: 141 mg/dL — ABNORMAL HIGH (ref 65–99)

## 2017-07-14 ENCOUNTER — Other Ambulatory Visit: Payer: Self-pay

## 2017-07-14 ENCOUNTER — Emergency Department (HOSPITAL_COMMUNITY)
Admission: EM | Admit: 2017-07-14 | Discharge: 2017-07-15 | Disposition: A | Discharge status: Home / Self Care | Payer: Medicare Other | Attending: Emergency Medicine | Admitting: Emergency Medicine

## 2017-07-14 ENCOUNTER — Encounter (HOSPITAL_COMMUNITY): Payer: Self-pay | Admitting: *Deleted

## 2017-07-14 DIAGNOSIS — F419 Anxiety disorder, unspecified: Secondary | ICD-10-CM | POA: Insufficient documentation

## 2017-07-14 DIAGNOSIS — E119 Type 2 diabetes mellitus without complications: Secondary | ICD-10-CM | POA: Insufficient documentation

## 2017-07-14 DIAGNOSIS — I10 Essential (primary) hypertension: Secondary | ICD-10-CM | POA: Insufficient documentation

## 2017-07-14 DIAGNOSIS — Z7984 Long term (current) use of oral hypoglycemic drugs: Secondary | ICD-10-CM | POA: Insufficient documentation

## 2017-07-14 DIAGNOSIS — F209 Schizophrenia, unspecified: Secondary | ICD-10-CM | POA: Insufficient documentation

## 2017-07-14 DIAGNOSIS — R42 Dizziness and giddiness: Secondary | ICD-10-CM | POA: Diagnosis not present

## 2017-07-14 DIAGNOSIS — Z79899 Other long term (current) drug therapy: Secondary | ICD-10-CM | POA: Insufficient documentation

## 2017-07-14 DIAGNOSIS — F0390 Unspecified dementia without behavioral disturbance: Secondary | ICD-10-CM | POA: Insufficient documentation

## 2017-07-14 DIAGNOSIS — F17210 Nicotine dependence, cigarettes, uncomplicated: Secondary | ICD-10-CM | POA: Insufficient documentation

## 2017-07-14 NOTE — Discharge Instructions (Signed)
Continue medications as previously prescribed.  Return to the emergency department if symptoms significantly worsen or change. 

## 2017-07-14 NOTE — ED Triage Notes (Signed)
Pt brought in by [facility] for c/o dizziness; pt lives at a group home [facility]); pt called and stated he felt dizzy; cbg 90; ems reports that when pt stood he did feel dizzy and have a slight drop in BP with elevation of HR; pt had a bolus of 647ml by ems

## 2017-07-14 NOTE — ED Provider Notes (Signed)
[facility] EMERGENCY DEPARTMENT Provider Note   CSN: [id number] Arrival date & time: 07/14/17  2045     History   Chief Complaint Chief Complaint  Patient presents with  . Dizziness    HPI Scott [name] is a 67 y.o. male.  Patient is a 67 year old male with past medical history of schizophrenia, dementia, anxiety brought by EMS after he called 911 after an argument with his sister.  This upset him and he called 911, then was brought here.  He reported feeling dizzy, however this has resolved.  He currently has no complaints.  His caretaker is at bedside who is very familiar with him.  He states that he now seems fine.  The patient is requesting to go home without any further studies.  According to the caretaker at bedside, he tells me that the patient is at his baseline and does not want any testing.     The history is provided by the patient and a caregiver.  Dizziness  Quality:  Unable to specify Severity:  Unable to specify Timing:  Unable to specify   Past Medical History:  Diagnosis Date  . Anxiety   . Chronic hyponatremia 05/2013, 11/2013  . Dementia   . Dyslipidemia   . GERD (gastroesophageal reflux disease)   . Hypertension   . Hypoglycemia   . Schizophrenia ([place])   . Seizures ([place])   . Syncope   . Tardive dyskinesia   . Urinary tract infection   . Vitamin D deficiency     Patient Active Problem List   Diagnosis Date Noted  . Schizoaffective disorder, bipolar type ([place]) 02/07/2016  . Diabetes ([place]) 02/07/2016  . Schizophrenia ([place]) 01/24/2016  . GERD (gastroesophageal reflux disease) 01/24/2016  . Tardive dyskinesia 01/24/2016  . Chronic hyponatremia 01/24/2016  . Hypertension 01/24/2016    Past Surgical History:  Procedure Laterality Date  . CATARACT EXTRACTION W/PHACO Left 01/10/2016   Procedure: CATARACT EXTRACTION PHACO AND INTRAOCULAR LENS PLACEMENT (IOC);  Surgeon: Birder Robson, MD;  Location: [facility] ORS;  Service: Ophthalmology;   Laterality: Left;  [place] 00:53 AP% 24.2 CDE 12.88 fluid pack lot # [id number]  . EYE SURGERY    . FOOT SURGERY         Home Medications    Prior to Admission medications   Medication Sig Start Date End Date Taking? Authorizing Provider  acetaminophen (TYLENOL) 500 MG tablet Take 500 mg by mouth every 6 (six) hours as needed.    [provider]  amLODipine (NORVASC) 10 MG tablet Take 10 mg by mouth daily.    [provider]  atorvastatin (LIPITOR) 20 MG tablet Take 20 mg by mouth daily.    [provider]  Bromfenac Sodium (BROMSITE) 0.075 % SOLN Place 1 drop into the right eye 2 (two) times daily.    [provider]  cephALEXin (KEFLEX) 500 MG capsule Take 1 capsule (500 mg total) by mouth 4 (four) times daily. For 7 days Patient not taking: Reported on 06/08/2016 06/01/16   Kem Parkinson, PA-C  cetirizine (ZYRTEC) 10 MG tablet Take 10 mg by mouth daily.    [provider]  Difluprednate (DUREZOL) 0.05 % EMUL Place 1 drop into the right eye 2 (two) times daily.    [provider]  divalproex (DEPAKOTE) 500 MG DR tablet Take 2,000 mg by mouth at bedtime.    [provider]  docusate sodium (COLACE) 100 MG capsule Take 100 mg by mouth 2 (two) times daily.  [provider]  metFORMIN (GLUCOPHAGE) 850 MG tablet Take 1 tablet (850 mg total) by mouth 2 (two) times daily with a meal. 06/08/16   Daymon Larsen, MD  metoprolol succinate (TOPROL-XL) 50 MG 24 hr tablet Take 50 mg by mouth daily. Take with or immediately following a meal.     [provider]  mirtazapine (REMERON) 15 MG tablet Take 15 mg by mouth at bedtime.    [provider]  nitrofurantoin, macrocrystal-monohydrate, (MACROBID) 100 MG capsule Take 100 mg by mouth daily with breakfast.    [provider]  pantoprazole (PROTONIX) 40 MG tablet Take 40 mg by mouth 2 (two) times daily.    [provider]  QUEtiapine (SEROQUEL) 200  MG tablet Take 200 mg by mouth at bedtime.    [provider]  QUEtiapine (SEROQUEL) 25 MG tablet Take 25 mg by mouth 3 (three) times daily. Care giver states he also takes Seroquel 200mg  at bedtime    [provider]  Skin Protectants, Misc. (BAZA PROTECT EX) Apply 1 application topically daily as needed.    [provider]  sodium chloride 1 G tablet Take 1 g by mouth 4 (four) times daily.     [provider]  tamsulosin (FLOMAX) 0.4 MG CAPS capsule Take 0.4 mg by mouth daily after breakfast.    [provider]  trihexyphenidyl (ARTANE) 5 MG tablet Take 5 mg by mouth 2 (two) times daily.    [provider]    Family History History reviewed. No pertinent family history.  Social History Social History   Tobacco Use  . Smoking status: Current Every Day Smoker    Packs/day: 0.50    Types: Cigarettes  . Smokeless tobacco: Never Used  Substance Use Topics  . Alcohol use: No  . Drug use: No     Allergies   Patient has no known allergies.   Review of Systems Review of Systems  Neurological: Positive for dizziness.  All other systems reviewed and are negative.    Physical Exam Updated Vital Signs BP 105/69   Pulse 68   Temp 97.8 F (36.6 C) (Oral)   Resp 10   SpO2 100%   Physical Exam  Constitutional: He is oriented to person, place, and time. He appears well-developed and well-nourished. No distress.  HENT:  Head: Normocephalic and atraumatic.  Mouth/Throat: Oropharynx is clear and moist.  Eyes: EOM are normal. Pupils are equal, round, and reactive to light.  Neck: Normal range of motion. Neck supple.  Cardiovascular: Normal rate and regular rhythm. Exam reveals no friction rub.  No murmur heard. Pulmonary/Chest: Effort normal and breath sounds normal. No respiratory distress. He has no wheezes. He has no rales.  Abdominal: Soft. Bowel sounds are normal. He exhibits no distension. There is no tenderness.    Musculoskeletal: Normal range of motion. He exhibits no edema.  Neurological: He is alert and oriented to person, place, and time. Coordination normal.  Skin: Skin is warm and dry. He is not diaphoretic.  Nursing note and vitals reviewed.    ED Treatments / Results  Labs (all labs ordered are listed, but only abnormal results are displayed) Labs Reviewed - No data to display  EKG  EKG Interpretation  Date/Time:  Sunday July 14 2017 21:36:57 EST Ventricular Rate:  68 PR Interval:    QRS Duration: 167 QT Interval:  401 QTC Calculation: 427 R Axis:   48 Text Interpretation:  Junctional rhythm Nonspecific intraventricular conduction delay Abnormal  T, consider ischemia, lateral leads Interpretation limited secondary to artifact Otherwise no significant change Confirmed by Fredia Sorrow [phone]) on 07/14/2017 10:53:41 PM       Radiology No results found.  Procedures Procedures (including critical care time)  Medications Ordered in ED Medications - No data to display   Initial Impression / Assessment and Plan / ED Course  I have reviewed the triage vital signs and the nursing notes.  Pertinent labs & imaging results that were available during my care of the patient were reviewed by me and considered in my medical decision making (see chart for details).  According to the caretaker, the patient is back to his baseline and is requesting no studies be done.  His physical examination is unremarkable.  He has been in the emergency department for 3 hours and he has remained stable.  At this point he will be discharged, to return as needed for any problems.  Final Clinical Impressions(s) / ED Diagnoses   Final diagnoses:  None    ED Discharge Orders    None       Veryl Speak, MD 07/14/17 2356

## 2017-08-04 ENCOUNTER — Encounter (HOSPITAL_COMMUNITY): Payer: Self-pay

## 2017-08-04 ENCOUNTER — Other Ambulatory Visit: Payer: Self-pay

## 2017-08-04 ENCOUNTER — Emergency Department (HOSPITAL_COMMUNITY)
Admission: EM | Admit: 2017-08-04 | Discharge: 2017-08-04 | Disposition: A | Discharge status: Home / Self Care | Payer: Medicare Other | Attending: Emergency Medicine | Admitting: Emergency Medicine

## 2017-08-04 DIAGNOSIS — Z79899 Other long term (current) drug therapy: Secondary | ICD-10-CM | POA: Diagnosis not present

## 2017-08-04 DIAGNOSIS — F0390 Unspecified dementia without behavioral disturbance: Secondary | ICD-10-CM | POA: Insufficient documentation

## 2017-08-04 DIAGNOSIS — Z7984 Long term (current) use of oral hypoglycemic drugs: Secondary | ICD-10-CM | POA: Insufficient documentation

## 2017-08-04 DIAGNOSIS — F17210 Nicotine dependence, cigarettes, uncomplicated: Secondary | ICD-10-CM | POA: Diagnosis not present

## 2017-08-04 DIAGNOSIS — N390 Urinary tract infection, site not specified: Secondary | ICD-10-CM | POA: Insufficient documentation

## 2017-08-04 DIAGNOSIS — R531 Weakness: Secondary | ICD-10-CM | POA: Diagnosis present

## 2017-08-04 DIAGNOSIS — E119 Type 2 diabetes mellitus without complications: Secondary | ICD-10-CM | POA: Insufficient documentation

## 2017-08-04 DIAGNOSIS — I10 Essential (primary) hypertension: Secondary | ICD-10-CM | POA: Diagnosis not present

## 2017-08-04 LAB — URINALYSIS, ROUTINE W REFLEX MICROSCOPIC
BACTERIA UA: NONE SEEN
BILIRUBIN URINE: NEGATIVE
Glucose, UA: >=500 mg/dL — AB
HGB URINE DIPSTICK: NEGATIVE
KETONES UR: 5 mg/dL — AB
Nitrite: POSITIVE — AB
Protein, ur: NEGATIVE mg/dL
SQUAMOUS EPITHELIAL / LPF: NONE SEEN
Specific Gravity, Urine: 1.015 (ref 1.005–1.030)
pH: 6 (ref 5.0–8.0)

## 2017-08-04 LAB — COMPREHENSIVE METABOLIC PANEL
ALBUMIN: 3.3 g/dL — AB (ref 3.5–5.0)
ALK PHOS: 53 U/L (ref 38–126)
ALT: 15 U/L — ABNORMAL LOW (ref 17–63)
ANION GAP: 10 (ref 5–15)
AST: 17 U/L (ref 15–41)
BUN: 12 mg/dL (ref 6–20)
CALCIUM: 9.4 mg/dL (ref 8.9–10.3)
CO2: 24 mmol/L (ref 22–32)
Chloride: 96 mmol/L — ABNORMAL LOW (ref 101–111)
Creatinine, Ser: 0.95 mg/dL (ref 0.61–1.24)
GFR calc non Af Amer: >60 mL/min (ref >60)
GLUCOSE: 172 mg/dL — AB (ref 65–99)
POTASSIUM: 4.1 mmol/L (ref 3.5–5.1)
SODIUM: 130 mmol/L — AB (ref 135–145)
Total Bilirubin: 0.3 mg/dL (ref 0.3–1.2)
Total Protein: 6.9 g/dL (ref 6.5–8.1)

## 2017-08-04 LAB — CBC WITH DIFFERENTIAL/PLATELET
Basophils Absolute: 0 10*3/uL (ref 0.0–0.1)
Basophils Relative: 0 %
EOS ABS: 0 10*3/uL (ref 0.0–0.7)
Eosinophils Relative: 0 %
HEMATOCRIT: 36.6 % — AB (ref 39.0–52.0)
HEMOGLOBIN: 11.7 g/dL — AB (ref 13.0–17.0)
LYMPHS ABS: 1.4 10*3/uL (ref 0.7–4.0)
LYMPHS PCT: 14 %
MCH: 28.8 pg (ref 26.0–34.0)
MCHC: 32 g/dL (ref 30.0–36.0)
MCV: 90.1 fL (ref 78.0–100.0)
MONOS PCT: 14 %
Monocytes Absolute: 1.3 10*3/uL — ABNORMAL HIGH (ref 0.1–1.0)
NEUTROS ABS: 6.8 10*3/uL (ref 1.7–7.7)
Neutrophils Relative %: 72 %
Platelets: 218 10*3/uL (ref 150–400)
RBC: 4.06 MIL/uL — ABNORMAL LOW (ref 4.22–5.81)
RDW: 14.7 % (ref 11.5–15.5)
WBC: 9.5 10*3/uL (ref 4.0–10.5)

## 2017-08-04 MED ORDER — SODIUM CHLORIDE 0.9 % IV SOLN
1.0000 g | Freq: Once | INTRAVENOUS | Status: AC
  Administered 2017-08-04: 1 g via INTRAVENOUS
  Filled 2017-08-04: qty 10

## 2017-08-04 MED ORDER — CEPHALEXIN 500 MG PO CAPS: 500.0000 mg | ORAL_CAPSULE | Freq: Three times a day (TID) | ORAL | 0 refills | Status: DC

## 2017-08-04 NOTE — ED Notes (Signed)
Pt changed into paper scrub paints

## 2017-08-04 NOTE — ED Notes (Addendum)
Contacted caretaker at [facility] in [place].  Caretaker's name: Robet Leu Caretaker's Contact information: [phone]  Reported Pt diagnosis and route of treatment out-patient.  Referred to primary physician for follow-up care.

## 2017-08-04 NOTE — ED Triage Notes (Signed)
Reports decreased urine output and strong odor to urine.  Reports history of frequent uti's.

## 2017-08-04 NOTE — ED Triage Notes (Signed)
Pt resident of [facility].  Caregiver says pt has had generalized weakness for past few days and decreased appetite.  Reports has required more assistance than usual to ambulate.

## 2017-08-04 NOTE — ED Provider Notes (Signed)
[facility] EMERGENCY DEPARTMENT Provider Note   CSN: [id number] Arrival date & time: 08/04/17  [place]     History   Chief Complaint Chief Complaint  Patient presents with  . Weakness    HPI Scott [name] is a 67 y.o. male.  HPI Patient is a poor historian.  History of schizophrenia.  Level 5 caveat.  Unsure why he is in the emergency department.  Per EMS, caretaker reports generalized weakness and decreased appetite for the past few days.  Normally ambulates with walker but is requiring more help for ambulation.  Decreased urine output and strong urine odor.  History of prior UTIs. Past Medical History:  Diagnosis Date  . Anxiety   . Chronic hyponatremia 05/2013, 11/2013  . Dementia   . Dyslipidemia   . GERD (gastroesophageal reflux disease)   . Hypertension   . Hypoglycemia   . Schizophrenia ([place])   . Seizures ([place])   . Syncope   . Tardive dyskinesia   . Urinary tract infection   . Vitamin D deficiency     Patient Active Problem List   Diagnosis Date Noted  . Schizoaffective disorder, bipolar type ([place]) 02/07/2016  . Diabetes ([place]) 02/07/2016  . Schizophrenia ([place]) 01/24/2016  . GERD (gastroesophageal reflux disease) 01/24/2016  . Tardive dyskinesia 01/24/2016  . Chronic hyponatremia 01/24/2016  . Hypertension 01/24/2016    Past Surgical History:  Procedure Laterality Date  . CATARACT EXTRACTION W/PHACO Left 01/10/2016   Procedure: CATARACT EXTRACTION PHACO AND INTRAOCULAR LENS PLACEMENT (IOC);  Surgeon: Birder Robson, MD;  Location: [facility] ORS;  Service: Ophthalmology;  Laterality: Left;  [place] 00:53 AP% 24.2 CDE 12.88 fluid pack lot # [id number]  . EYE SURGERY    . FOOT SURGERY         Home Medications    Prior to Admission medications   Medication Sig Start Date End Date Taking? Authorizing Provider  acetaminophen (TYLENOL) 500 MG tablet Take 500 mg by mouth every 6 (six) hours as needed.   Yes [provider]  amLODipine (NORVASC) 10 MG  tablet Take 10 mg by mouth daily.   Yes [provider]  benztropine (COGENTIN) 0.5 MG tablet Take 0.5 mg by mouth 2 (two) times daily.   Yes [provider]  cetirizine (ZYRTEC) 10 MG tablet Take 10 mg by mouth daily.   Yes [provider]  divalproex (DEPAKOTE) 500 MG DR tablet Take 2,000 mg by mouth at bedtime.   Yes [provider]  docusate sodium (COLACE) 100 MG capsule Take 100 mg by mouth 2 (two) times daily.   Yes [provider]  haloperidol decanoate (HALDOL DECANOATE) 50 MG/ML injection Inject 100 mg into the muscle every 28 (twenty-eight) days.   Yes [provider]  metFORMIN (GLUCOPHAGE) 850 MG tablet Take 1 tablet (850 mg total) by mouth 2 (two) times daily with a meal. 06/08/16  Yes Daymon Larsen, MD  metoprolol succinate (TOPROL-XL) 50 MG 24 hr tablet Take 50 mg by mouth daily. Take with or immediately following a meal.    Yes [provider]  QUEtiapine (SEROQUEL) 200 MG tablet Take 200 mg by mouth at bedtime.   Yes [provider]  QUEtiapine (SEROQUEL) 25 MG tablet Take 25 mg by mouth daily as needed. Care giver states he also takes Seroquel 200mg  at bedtime   Yes [provider]  ranitidine (ZANTAC) 75 MG tablet Take 75 mg by mouth 2 (two) times daily.   Yes [provider]  simvastatin (ZOCOR) 20 MG tablet Take 20 mg by mouth daily.   Yes [provider]  Skin Protectants, Misc. (BAZA PROTECT EX) Apply 1 application topically daily as needed.   Yes [provider]  tamsulosin (FLOMAX) 0.4 MG CAPS capsule Take 0.4 mg by mouth daily after breakfast.   Yes [provider]  Vitamin D, Ergocalciferol, (DRISDOL) 50000 units CAPS capsule Take 50,000 Units by mouth every 7 (seven) days.   Yes [provider]  cephALEXin (KEFLEX) 500 MG capsule Take 1 capsule (500 mg total) by mouth 3 (three) times daily. 08/04/17   Julianne Rice, MD    Family History No  family history on file.  Social History Social History   Tobacco Use  . Smoking status: Current Every Day Smoker    Packs/day: 0.50    Types: Cigarettes  . Smokeless tobacco: Never Used  Substance Use Topics  . Alcohol use: No  . Drug use: No     Allergies   Patient has no known allergies.   Review of Systems Review of Systems  Unable to perform ROS: Psychiatric disorder     Physical Exam Updated Vital Signs BP 126/88   Pulse 84   Temp (!) 97.4 F (36.3 C) (Oral)   Resp 20   Wt 104.3 kg (230 lb)   SpO2 94%   BMI 28.75 kg/m   Physical Exam  Constitutional: He appears well-developed and well-nourished. No distress.  HENT:  Head: Normocephalic and atraumatic.  Mouth/Throat: Oropharynx is clear and moist.  Lip smacking consistent with history of tardive dyskinesia.  No apparent facial asymmetry.  Edentulous  Eyes: EOM are normal. Pupils are equal, round, and reactive to light.  Neck: Normal range of motion. Neck supple.  Cardiovascular: Normal rate and regular rhythm. Exam reveals no gallop and no friction rub.  No murmur heard. Pulmonary/Chest: Effort normal and breath sounds normal. No stridor. No respiratory distress. He has no wheezes. He has no rales. He exhibits no tenderness.  Abdominal: Soft. Bowel sounds are normal. There is no tenderness. There is no rebound and no guarding.  Musculoskeletal: Normal range of motion. He exhibits edema. He exhibits no tenderness.  2+ bilateral lower extremity pitting edema.  No asymmetry.  Neurological: He is alert.  Oriented to person and place.  5/5 motor in all extremities.  Sensation grossly intact.  Skin: Skin is warm and dry. Capillary refill takes less than 2 seconds. No rash noted. He is not diaphoretic. No erythema.  Nursing note and vitals reviewed.    ED Treatments / Results  Labs (all labs ordered are listed, but only abnormal results are displayed) Labs Reviewed  CBC WITH DIFFERENTIAL/PLATELET -  Abnormal; Notable for the following components:      Result Value   RBC 4.06 (*)    Hemoglobin 11.7 (*)    HCT 36.6 (*)    Monocytes Absolute 1.3 (*)    All other components within normal limits  URINALYSIS, ROUTINE W REFLEX MICROSCOPIC - Abnormal; Notable for the following components:   APPearance HAZY (*)    Glucose, UA >=500 (*)    Ketones, ur 5 (*)    Nitrite POSITIVE (*)    Leukocytes, UA LARGE (*)    All other components within normal limits  COMPREHENSIVE METABOLIC PANEL - Abnormal; Notable for the following components:   Sodium 130 (*)    Chloride 96 (*)    Glucose, Bld 172 (*)    Albumin 3.3 (*)    ALT  15 (*)    All other components within normal limits  URINE CULTURE    EKG  EKG Interpretation  Date/Time:  "Sunday August 04 2017 18:45:11 EST Ventricular Rate:  88 PR Interval:    QRS Duration: 98 QT Interval:  353 QTC Calculation: 428 R Axis:   64 Text Interpretation:  Sinus rhythm Consider right atrial enlargement Low voltage, extremity leads Abnormal R-wave progression, early transition Nonspecific T abnormalities, lateral leads Artifact in lead(s) I III aVR aVL aVF Confirmed by Roderic Lammert ([id number]) on 08/04/2017 7:23:13 PM       Radiology No results found.  Procedures Procedures (including critical care time)  Medications Ordered in ED Medications  cefTRIAXone (ROCEPHIN) 1 g in sodium chloride 0.9 % 100 mL IVPB (0 g Intravenous Stopped 08/04/17 2127)     Initial Impression / Assessment and Plan / ED Course  I have reviewed the triage vital signs and the nursing notes.  Pertinent labs & imaging results that were available during my care of the patient were reviewed by me and considered in my medical decision making (see chart for details).     Appears to be at baseline mental status.  UA with evidence of urinary tract infection.  Given IV Rocephin in the emergency department and urine is been sent for culture.  Will discharge home with antibiotics.   Return precautions given.  Caretaker's been notified.  Final Clinical Impressions(s) / ED Diagnoses   Final diagnoses:  Lower urinary tract infectious disease    ED Discharge Orders        Ordered    cephALEXin (KEFLEX) 500 MG capsule  3 times daily     03" /03/19 2139       Julianne Rice, MD 08/05/17 1540

## 2017-08-07 LAB — URINE CULTURE: Culture: >=100000 — AB

## 2017-08-08 ENCOUNTER — Telehealth: Payer: Self-pay | Admitting: *Deleted

## 2017-08-08 NOTE — Telephone Encounter (Signed)
Post ED Visit - Positive Culture Follow-up  Culture report reviewed by antimicrobial stewardship pharmacist:  []  Elenor Quinones, Pharm.D. []  Heide Guile, Pharm.D., BCPS AQ-ID []  Parks Neptune, Pharm.D., BCPS []  Alycia Rossetti, Pharm.D., BCPS []  [place], Pharm.D., BCPS, AAHIVP []  Legrand Como, Pharm.D., BCPS, AAHIVP []  Salome Arnt, PharmD, BCPS []  Jalene Mullet, PharmD []  Vincenza Hews, PharmD, BCPS Leroy Libman, PharmD  Positive urine culture Treated with Cephalexin, organism sensitive to the same and no further patient follow-up is required at this time.  Harlon Flor [facility] 08/08/2017, 10:59 AM

## 2017-10-08 ENCOUNTER — Encounter (HOSPITAL_COMMUNITY): Payer: Self-pay | Admitting: Emergency Medicine

## 2017-10-08 ENCOUNTER — Emergency Department (HOSPITAL_COMMUNITY)
Admission: EM | Admit: 2017-10-08 | Discharge: 2017-10-08 | Disposition: A | Discharge status: Home / Self Care | Payer: Medicare Other | Attending: Emergency Medicine | Admitting: Emergency Medicine

## 2017-10-08 ENCOUNTER — Other Ambulatory Visit: Payer: Self-pay

## 2017-10-08 DIAGNOSIS — F17210 Nicotine dependence, cigarettes, uncomplicated: Secondary | ICD-10-CM | POA: Insufficient documentation

## 2017-10-08 DIAGNOSIS — Z79899 Other long term (current) drug therapy: Secondary | ICD-10-CM | POA: Insufficient documentation

## 2017-10-08 DIAGNOSIS — N390 Urinary tract infection, site not specified: Secondary | ICD-10-CM | POA: Insufficient documentation

## 2017-10-08 DIAGNOSIS — Z7984 Long term (current) use of oral hypoglycemic drugs: Secondary | ICD-10-CM | POA: Diagnosis not present

## 2017-10-08 DIAGNOSIS — R300 Dysuria: Secondary | ICD-10-CM | POA: Diagnosis present

## 2017-10-08 DIAGNOSIS — I10 Essential (primary) hypertension: Secondary | ICD-10-CM | POA: Diagnosis not present

## 2017-10-08 DIAGNOSIS — E119 Type 2 diabetes mellitus without complications: Secondary | ICD-10-CM | POA: Insufficient documentation

## 2017-10-08 LAB — URINALYSIS, ROUTINE W REFLEX MICROSCOPIC
Bilirubin Urine: NEGATIVE
Glucose, UA: NEGATIVE mg/dL
KETONES UR: NEGATIVE mg/dL
Nitrite: POSITIVE — AB
PROTEIN: NEGATIVE mg/dL
Specific Gravity, Urine: 1.014 (ref 1.005–1.030)
WBC, UA: >50 WBC/hpf — ABNORMAL HIGH (ref 0–5)
pH: 6 (ref 5.0–8.0)

## 2017-10-08 MED ORDER — CEPHALEXIN 500 MG PO CAPS
500.0000 mg | ORAL_CAPSULE | Freq: Once | ORAL | Status: AC
  Administered 2017-10-08: 500 mg via ORAL
  Filled 2017-10-08: qty 1

## 2017-10-08 MED ORDER — CEPHALEXIN 500 MG PO CAPS: 500.0000 mg | ORAL_CAPSULE | Freq: Four times a day (QID) | ORAL | 0 refills | Status: DC

## 2017-10-08 NOTE — ED Triage Notes (Signed)
Per EMS, pt from [place] c/o dysuria x 3 weeks. Denies fever/chills, pelvic or abdominal pain.

## 2017-10-08 NOTE — Discharge Instructions (Signed)
Return if any problems.

## 2017-10-08 NOTE — ED Provider Notes (Signed)
[facility] EMERGENCY DEPARTMENT Provider Note   CSN: [id number] Arrival date & time: 10/08/17  0848     History   Chief Complaint Chief Complaint  Patient presents with  . Dysuria    HPI Scott [name] is a 67 y.o. male.  The history is provided by the patient. No language interpreter was used.  Dysuria   This is a new problem. The current episode started yesterday. The problem occurs every urination. The problem has been gradually worsening. The pain is moderate. There has been no fever. Associated symptoms include nausea. Pertinent negatives include no chills. He has tried nothing for the symptoms. His past medical history is significant for recurrent UTIs.    Past Medical History:  Diagnosis Date  . Anxiety   . Chronic hyponatremia 05/2013, 11/2013  . Dementia   . Dyslipidemia   . GERD (gastroesophageal reflux disease)   . Hypertension   . Hypoglycemia   . Schizophrenia ([place])   . Seizures ([place])   . Syncope   . Tardive dyskinesia   . Urinary tract infection   . Vitamin D deficiency     Patient Active Problem List   Diagnosis Date Noted  . Schizoaffective disorder, bipolar type ([place]) 02/07/2016  . Diabetes ([place]) 02/07/2016  . Schizophrenia ([place]) 01/24/2016  . GERD (gastroesophageal reflux disease) 01/24/2016  . Tardive dyskinesia 01/24/2016  . Chronic hyponatremia 01/24/2016  . Hypertension 01/24/2016    Past Surgical History:  Procedure Laterality Date  . CATARACT EXTRACTION W/PHACO Left 01/10/2016   Procedure: CATARACT EXTRACTION PHACO AND INTRAOCULAR LENS PLACEMENT (IOC);  Surgeon: Birder Robson, MD;  Location: [facility] ORS;  Service: Ophthalmology;  Laterality: Left;  [place] 00:53 AP% 24.2 CDE 12.88 fluid pack lot # [id number]  . EYE SURGERY    . FOOT SURGERY          Home Medications    Prior to Admission medications   Medication Sig Start Date End Date Taking? Authorizing Provider  acetaminophen (TYLENOL) 500 MG tablet Take 500 mg by mouth every 6  (six) hours as needed.    [provider]  amLODipine (NORVASC) 10 MG tablet Take 10 mg by mouth daily.    [provider]  benztropine (COGENTIN) 0.5 MG tablet Take 0.5 mg by mouth 2 (two) times daily.    [provider]  cephALEXin (KEFLEX) 500 MG capsule Take 1 capsule (500 mg total) by mouth 3 (three) times daily. 08/04/17   Julianne Rice, MD  cetirizine (ZYRTEC) 10 MG tablet Take 10 mg by mouth daily.    [provider]  divalproex (DEPAKOTE) 500 MG DR tablet Take 2,000 mg by mouth at bedtime.    [provider]  docusate sodium (COLACE) 100 MG capsule Take 100 mg by mouth 2 (two) times daily.    [provider]  haloperidol decanoate (HALDOL DECANOATE) 50 MG/ML injection Inject 100 mg into the muscle every 28 (twenty-eight) days.    [provider]  metFORMIN (GLUCOPHAGE) 850 MG tablet Take 1 tablet (850 mg total) by mouth 2 (two) times daily with a meal. 06/08/16   Daymon Larsen, MD  metoprolol succinate (TOPROL-XL) 50 MG 24 hr tablet Take 50 mg by mouth daily. Take with or immediately following a meal.     [provider]  QUEtiapine (SEROQUEL) 200 MG tablet Take 200 mg by mouth at bedtime.    [provider]  QUEtiapine (SEROQUEL) 25 MG tablet Take 25 mg by mouth daily as needed. Care giver  states he also takes Seroquel 200mg  at bedtime    [provider]  ranitidine (ZANTAC) 75 MG tablet Take 75 mg by mouth 2 (two) times daily.    [provider]  simvastatin (ZOCOR) 20 MG tablet Take 20 mg by mouth daily.    [provider]  Skin Protectants, Misc. (BAZA PROTECT EX) Apply 1 application topically daily as needed.    [provider]  tamsulosin (FLOMAX) 0.4 MG CAPS capsule Take 0.4 mg by mouth daily after breakfast.    [provider]  Vitamin D, Ergocalciferol, (DRISDOL) 50000 units CAPS capsule Take 50,000 Units by mouth every 7 (seven) days.    [provider]    Family History No family history on file.  Social History Social History   Tobacco Use  . Smoking status: Current Every Day Smoker    Packs/day: 0.50    Types: Cigarettes  . Smokeless tobacco: Never Used  Substance Use Topics  . Alcohol use: No  . Drug use: No     Allergies   Patient has no known allergies.   Review of Systems Review of Systems  Constitutional: Negative for chills.  Gastrointestinal: Positive for nausea.  Genitourinary: Positive for dysuria.  All other systems reviewed and are negative.    Physical Exam Updated Vital Signs BP 115/79 (BP Location: Left Arm)   Pulse 73   Temp 98.3 F (36.8 C) (Oral)   Resp 16   Ht 6\' 2"  (1.88 m)   Wt 104.3 kg (230 lb)   SpO2 100%   BMI 29.53 kg/m   Physical Exam  Constitutional: He is oriented to person, place, and time. He appears well-developed and well-nourished.  HENT:  Head: Normocephalic.  Left Ear: External ear normal.  Mouth/Throat: Oropharynx is clear and moist.  Eyes: Pupils are equal, round, and reactive to light. EOM are normal.  Neck: Normal range of motion.  Cardiovascular: Normal rate, regular rhythm and normal heart sounds.  Pulmonary/Chest: Effort normal and breath sounds normal.  Abdominal: Soft. He exhibits no distension.  Musculoskeletal: Normal range of motion.  Neurological: He is alert and oriented to person, place, and time.  Skin: Skin is warm.  Psychiatric: He has a normal mood and affect.  Nursing note and vitals reviewed.    ED Treatments / Results  Labs (all labs ordered are listed, but only abnormal results are displayed) Labs Reviewed  URINALYSIS, ROUTINE W REFLEX MICROSCOPIC - Abnormal; Notable for the following components:      Result Value   APPearance HAZY (*)    Hgb urine dipstick SMALL (*)    Nitrite POSITIVE (*)    Leukocytes, UA MODERATE (*)    WBC, UA >50 (*)    Bacteria, UA RARE (*)    Non Squamous Epithelial 0-5 (*)    All other  components within normal limits    EKG None  Radiology No results found.  Procedures Procedures (including critical care time)  Medications Ordered in ED Medications - No data to display   Initial Impression / Assessment and Plan / ED Course  I have reviewed the triage vital signs and the nursing notes.  Pertinent labs & imaging results that were available during my care of the patient were reviewed by me and considered in my medical decision making (see chart for details).     Pt's symptoms and urine consistent with urinary tract infection urine culture added.  Final Clinical Impressions(s) / ED Diagnoses   Final diagnoses:  Urinary tract infection without hematuria, site unspecified    ED Discharge Orders        Ordered    cephALEXin (KEFLEX) 500 MG capsule  4 times daily     10/08/17 1030    An After Visit Summary was printed and given to the patient.    Fransico Meadow, [place] 10/08/17 1032    Margette Fast, MD 10/08/17 [phone]

## 2017-10-11 LAB — URINE CULTURE

## 2017-10-12 ENCOUNTER — Telehealth: Payer: Self-pay

## 2017-10-12 NOTE — Telephone Encounter (Signed)
Post ED Visit - Positive Culture Follow-up  Culture report reviewed by antimicrobial stewardship pharmacist:  []  Elenor Quinones, Pharm.D. []  Heide Guile, Pharm.D., BCPS AQ-ID []  [place]ks Ne[place]une, Pharm.D., BCPS []  Alycia Rossetti, Pharm.D., BCPS []  [place], Pharm.D., BCPS, AAHIVP []  Legrand Como, Pharm.D., BCPS, AAHIVP [x]  Salome Arnt, PharmD, BCPS []  Wynell Balloon, PharmD []  Vincenza Hews, PharmD, BCPS  Positive urine culture Treated with Cephalexin, organism sensitive to the same and no further patient follow-up is required at this time.  Genia Del 10/12/2017, 10:13 AM

## 2017-10-14 ENCOUNTER — Emergency Department (HOSPITAL_COMMUNITY): Payer: Medicare Other

## 2017-10-14 ENCOUNTER — Inpatient Hospital Stay (HOSPITAL_COMMUNITY)
Admission: EM | Admit: 2017-10-14 | Discharge: 2017-10-17 | DRG: 640 | Disposition: A | Discharge status: Skilled Nursing Facility | Payer: Medicare Other | Attending: Internal Medicine | Admitting: Internal Medicine

## 2017-10-14 DIAGNOSIS — G40909 Epilepsy, unspecified, not intractable, without status epilepticus: Secondary | ICD-10-CM | POA: Diagnosis present

## 2017-10-14 DIAGNOSIS — E119 Type 2 diabetes mellitus without complications: Secondary | ICD-10-CM | POA: Diagnosis present

## 2017-10-14 DIAGNOSIS — E871 Hypo-osmolality and hyponatremia: Secondary | ICD-10-CM | POA: Diagnosis present

## 2017-10-14 DIAGNOSIS — K219 Gastro-esophageal reflux disease without esophagitis: Secondary | ICD-10-CM | POA: Diagnosis present

## 2017-10-14 DIAGNOSIS — Z7984 Long term (current) use of oral hypoglycemic drugs: Secondary | ICD-10-CM

## 2017-10-14 DIAGNOSIS — E861 Hypovolemia: Secondary | ICD-10-CM | POA: Diagnosis present

## 2017-10-14 DIAGNOSIS — F17210 Nicotine dependence, cigarettes, uncomplicated: Secondary | ICD-10-CM | POA: Diagnosis present

## 2017-10-14 DIAGNOSIS — F250 Schizoaffective disorder, bipolar type: Secondary | ICD-10-CM | POA: Diagnosis present

## 2017-10-14 DIAGNOSIS — E785 Hyperlipidemia, unspecified: Secondary | ICD-10-CM | POA: Diagnosis present

## 2017-10-14 DIAGNOSIS — D6489 Other specified anemias: Secondary | ICD-10-CM | POA: Diagnosis present

## 2017-10-14 DIAGNOSIS — G9341 Metabolic encephalopathy: Secondary | ICD-10-CM | POA: Diagnosis present

## 2017-10-14 DIAGNOSIS — F0390 Unspecified dementia without behavioral disturbance: Secondary | ICD-10-CM | POA: Diagnosis present

## 2017-10-14 DIAGNOSIS — Z87440 Personal history of urinary (tract) infections: Secondary | ICD-10-CM | POA: Diagnosis not present

## 2017-10-14 DIAGNOSIS — I10 Essential (primary) hypertension: Secondary | ICD-10-CM

## 2017-10-14 DIAGNOSIS — G2401 Drug induced subacute dyskinesia: Secondary | ICD-10-CM | POA: Diagnosis present

## 2017-10-14 DIAGNOSIS — E1169 Type 2 diabetes mellitus with other specified complication: Secondary | ICD-10-CM | POA: Diagnosis not present

## 2017-10-14 DIAGNOSIS — F258 Other schizoaffective disorders: Secondary | ICD-10-CM | POA: Diagnosis present

## 2017-10-14 DIAGNOSIS — R531 Weakness: Secondary | ICD-10-CM | POA: Diagnosis not present

## 2017-10-14 LAB — CBC WITH DIFFERENTIAL/PLATELET
BASOS ABS: 0 10*3/uL (ref 0.0–0.1)
Basophils Relative: 0 %
Eosinophils Absolute: 0 10*3/uL (ref 0.0–0.7)
Eosinophils Relative: 0 %
HEMATOCRIT: 31.9 % — AB (ref 39.0–52.0)
Hemoglobin: 10.6 g/dL — ABNORMAL LOW (ref 13.0–17.0)
LYMPHS ABS: 1.4 10*3/uL (ref 0.7–4.0)
LYMPHS PCT: 26 %
MCH: 29.2 pg (ref 26.0–34.0)
MCHC: 33.2 g/dL (ref 30.0–36.0)
MCV: 87.9 fL (ref 78.0–100.0)
MONO ABS: 0.5 10*3/uL (ref 0.1–1.0)
MONOS PCT: 10 %
NEUTROS ABS: 3.5 10*3/uL (ref 1.7–7.7)
Neutrophils Relative %: 64 %
Platelets: 213 10*3/uL (ref 150–400)
RBC: 3.63 MIL/uL — ABNORMAL LOW (ref 4.22–5.81)
RDW: 15.5 % (ref 11.5–15.5)
WBC: 5.5 10*3/uL (ref 4.0–10.5)

## 2017-10-14 LAB — COMPREHENSIVE METABOLIC PANEL
ALBUMIN: 3 g/dL — AB (ref 3.5–5.0)
ALT: 13 U/L — ABNORMAL LOW (ref 17–63)
ANION GAP: 9 (ref 5–15)
AST: 20 U/L (ref 15–41)
Alkaline Phosphatase: 43 U/L (ref 38–126)
BUN: 23 mg/dL — ABNORMAL HIGH (ref 6–20)
CHLORIDE: 95 mmol/L — AB (ref 101–111)
CO2: 21 mmol/L — ABNORMAL LOW (ref 22–32)
Calcium: 8.8 mg/dL — ABNORMAL LOW (ref 8.9–10.3)
Creatinine, Ser: 0.97 mg/dL (ref 0.61–1.24)
GFR calc Af Amer: >60 mL/min (ref >60)
GFR calc non Af Amer: >60 mL/min (ref >60)
GLUCOSE: 159 mg/dL — AB (ref 65–99)
POTASSIUM: 4.5 mmol/L (ref 3.5–5.1)
Sodium: 125 mmol/L — ABNORMAL LOW (ref 135–145)
Total Bilirubin: 0.6 mg/dL (ref 0.3–1.2)
Total Protein: 6.7 g/dL (ref 6.5–8.1)

## 2017-10-14 LAB — URINALYSIS, ROUTINE W REFLEX MICROSCOPIC
BACTERIA UA: NONE SEEN
Bilirubin Urine: NEGATIVE
Glucose, UA: NEGATIVE mg/dL
HGB URINE DIPSTICK: NEGATIVE
KETONES UR: 5 mg/dL — AB
Leukocytes, UA: NEGATIVE
NITRITE: NEGATIVE
Protein, ur: 30 mg/dL — AB
Specific Gravity, Urine: 1.019 (ref 1.005–1.030)
pH: 5 (ref 5.0–8.0)

## 2017-10-14 LAB — TROPONIN I

## 2017-10-14 LAB — VALPROIC ACID LEVEL: VALPROIC ACID LVL: 84 ug/mL (ref 50.0–100.0)

## 2017-10-14 LAB — CK: Total CK: 83 U/L (ref 49–397)

## 2017-10-14 MED ORDER — INSULIN ASPART 100 UNIT/ML ~~LOC~~ SOLN
0.0000 [IU] | Freq: Three times a day (TID) | SUBCUTANEOUS | Status: DC
  Administered 2017-10-15 – 2017-10-16 (×4): 1 [IU] via SUBCUTANEOUS
  Administered 2017-10-17: 3 [IU] via SUBCUTANEOUS

## 2017-10-14 MED ORDER — BENZTROPINE MESYLATE 1 MG PO TABS
0.5000 mg | ORAL_TABLET | Freq: Two times a day (BID) | ORAL | Status: DC
  Administered 2017-10-14 – 2017-10-17 (×6): 0.5 mg via ORAL
  Filled 2017-10-14 (×6): qty 1

## 2017-10-14 MED ORDER — ACETAMINOPHEN 500 MG PO TABS
500.0000 mg | ORAL_TABLET | Freq: Four times a day (QID) | ORAL | Status: DC | PRN
  Administered 2017-10-14: 500 mg via ORAL
  Filled 2017-10-14: qty 1

## 2017-10-14 MED ORDER — ENOXAPARIN SODIUM 40 MG/0.4ML ~~LOC~~ SOLN
40.0000 mg | SUBCUTANEOUS | Status: DC
  Administered 2017-10-15 – 2017-10-17 (×3): 40 mg via SUBCUTANEOUS
  Filled 2017-10-14 (×4): qty 0.4

## 2017-10-14 MED ORDER — ONDANSETRON HCL 4 MG/2ML IJ SOLN: 4.0000 mg | Freq: Four times a day (QID) | INTRAMUSCULAR | Status: DC | PRN

## 2017-10-14 MED ORDER — SODIUM CHLORIDE 0.9 % IV SOLN
INTRAVENOUS | Status: DC
  Administered 2017-10-14 – 2017-10-17 (×4): via INTRAVENOUS

## 2017-10-14 MED ORDER — METOPROLOL SUCCINATE ER 50 MG PO TB24
50.0000 mg | ORAL_TABLET | Freq: Two times a day (BID) | ORAL | Status: DC
  Administered 2017-10-14 – 2017-10-17 (×6): 50 mg via ORAL
  Filled 2017-10-14 (×6): qty 1

## 2017-10-14 MED ORDER — AMLODIPINE BESYLATE 5 MG PO TABS
5.0000 mg | ORAL_TABLET | Freq: Every day | ORAL | Status: DC
  Administered 2017-10-15 – 2017-10-17 (×3): 5 mg via ORAL
  Filled 2017-10-14 (×4): qty 1

## 2017-10-14 MED ORDER — FAMOTIDINE 20 MG PO TABS
20.0000 mg | ORAL_TABLET | Freq: Every day | ORAL | Status: DC
  Administered 2017-10-15 – 2017-10-17 (×3): 20 mg via ORAL
  Filled 2017-10-14 (×4): qty 1

## 2017-10-14 MED ORDER — ONDANSETRON HCL 4 MG PO TABS: 4.0000 mg | ORAL_TABLET | Freq: Four times a day (QID) | ORAL | Status: DC | PRN

## 2017-10-14 MED ORDER — LORATADINE 10 MG PO TABS
10.0000 mg | ORAL_TABLET | Freq: Every day | ORAL | Status: DC
  Administered 2017-10-15 – 2017-10-17 (×3): 10 mg via ORAL
  Filled 2017-10-14 (×3): qty 1

## 2017-10-14 MED ORDER — QUETIAPINE FUMARATE 100 MG PO TABS
200.0000 mg | ORAL_TABLET | Freq: Every day | ORAL | Status: DC
  Administered 2017-10-14 – 2017-10-16 (×3): 200 mg via ORAL
  Filled 2017-10-14 (×3): qty 2

## 2017-10-14 MED ORDER — SIMVASTATIN 20 MG PO TABS
20.0000 mg | ORAL_TABLET | Freq: Every day | ORAL | Status: DC
  Administered 2017-10-15 – 2017-10-17 (×3): 20 mg via ORAL
  Filled 2017-10-14 (×4): qty 1

## 2017-10-14 MED ORDER — DIVALPROEX SODIUM 250 MG PO DR TAB
2000.0000 mg | DELAYED_RELEASE_TABLET | Freq: Every day | ORAL | Status: DC
  Administered 2017-10-15 – 2017-10-16 (×3): 2000 mg via ORAL
  Filled 2017-10-14 (×3): qty 8

## 2017-10-14 NOTE — ED Notes (Signed)
Pt given sprite 

## 2017-10-14 NOTE — ED Notes (Signed)
Pt moved from bed to chair unwitnessed. RN and NT went into room and pt had incontinence episode. Pt/linen changed. Pt back in bed. Call light in reach.

## 2017-10-14 NOTE — H&P (Signed)
TRH H&P    Patient Demographics:    Scott [name], is a 67 y.o. male  MRN: [id number]  DOB - [birth year]  Admit Date - 10/14/2017  Referring MD/NP/PA: Dr. Jeneen Rinks  Outpatient Primary MD for the patient is Lauretta Grill, NP  Patient coming from: Group home  Chief complaint-altered mental status   HPI:    Scott [name]  is a 67 y.o. male, with history of dementia, GERD, dyslipidemia, chronic hyponatremia, schizoaffective disorder, bipolar type, tardive dyskinesia, hypertension was brought to hospital with complaints of altered mental status lethargy, generalized weakness.  Patient had recent UTI and was treated with Keflex.  Patient was found apparently on the floor this morning and was able to get back to bed by himself. He is poor historian and unable to provide any significant history. In the ED all the lab work yielded no significant abnormality. UA was clear, chest x-ray shows no infiltrate. Patient admitted for further evaluation for altered mental status/generalized weakness.    Review of systems:     Unobtainable as patient has altered mental status  With Past History of the following :    Past Medical History:  Diagnosis Date  . Anxiety   . Chronic hyponatremia 05/2013, 11/2013  . Dementia   . Dyslipidemia   . GERD (gastroesophageal reflux disease)   . Hypertension   . Hypoglycemia   . Schizophrenia ([place])   . Seizures ([place])   . Syncope   . Tardive dyskinesia   . Urinary tract infection   . Vitamin D deficiency       Past Surgical History:  Procedure Laterality Date  . CATARACT EXTRACTION W/PHACO Left 01/10/2016   Procedure: CATARACT EXTRACTION PHACO AND INTRAOCULAR LENS PLACEMENT (IOC);  Surgeon: Birder Robson, MD;  Location: [facility] ORS;  Service: Ophthalmology;  Laterality: Left;  [place] 00:53 AP% 24.2 CDE 12.88 fluid pack lot # [id number]  . EYE SURGERY    . FOOT SURGERY         Social History:      Social History   Tobacco Use  . Smoking status: Current Every Day Smoker    Packs/day: 0.50    Types: Cigarettes  . Smokeless tobacco: Never Used  Substance Use Topics  . Alcohol use: No       Family History :   Unobtainable due to patient's altered mental status.   Home Medications:   Prior to Admission medications   Medication Sig Start Date End Date Taking? Authorizing Provider  acetaminophen (TYLENOL) 500 MG tablet Take 500 mg by mouth every 6 (six) hours as needed.   Yes [provider]  amLODipine (NORVASC) 5 MG tablet Take 5 mg by mouth daily.    Yes [provider]  benztropine (COGENTIN) 0.5 MG tablet Take 0.5 mg by mouth 2 (two) times daily.   Yes [provider]  cephALEXin (KEFLEX) 500 MG capsule Take 1 capsule (500 mg total) by mouth 4 (four) times daily. 10/08/17  Yes Caryl Ada K, PA-C  cetirizine (ZYRTEC) 10 MG tablet Take 10 mg by  mouth daily.   Yes [provider]  divalproex (DEPAKOTE) 500 MG DR tablet Take 2,000 mg by mouth at bedtime.   Yes [provider]  docusate sodium (COLACE) 100 MG capsule Take 100 mg by mouth 2 (two) times daily.   Yes [provider]  haloperidol decanoate (HALDOL DECANOATE) 50 MG/ML injection Inject 100 mg into the muscle every 28 (twenty-eight) days.   Yes [provider]  metFORMIN (GLUCOPHAGE) 850 MG tablet Take 1 tablet (850 mg total) by mouth 2 (two) times daily with a meal. 06/08/16  Yes Daymon Larsen, MD  metoprolol succinate (TOPROL-XL) 50 MG 24 hr tablet Take 50 mg by mouth 2 (two) times daily. Take with or immediately following a meal.    Yes [provider]  QUEtiapine (SEROQUEL) 200 MG tablet Take 200 mg by mouth at bedtime.   Yes [provider]  QUEtiapine (SEROQUEL) 50 MG tablet Take 50 mg by mouth 2 (two) times daily. Care giver states he also takes Seroquel 200mg  at bedtime   Yes [provider]   ranitidine (ZANTAC) 75 MG tablet Take 75 mg by mouth 2 (two) times daily.   Yes [provider]  simvastatin (ZOCOR) 20 MG tablet Take 20 mg by mouth daily.   Yes [provider]  Skin Protectants, Misc. (BAZA PROTECT EX) Apply 1 application topically daily as needed.   Yes [provider]  tamsulosin (FLOMAX) 0.4 MG CAPS capsule Take 0.4 mg by mouth daily after breakfast.   Yes [provider]  Vitamin D, Ergocalciferol, (DRISDOL) 50000 units CAPS capsule Take 50,000 Units by mouth every 7 (seven) days.   Yes [provider]     Allergies:    No Known Allergies   Physical Exam:   Vitals  Blood pressure 116/81, pulse 61, temperature (!) 97.4 F (36.3 C), temperature source Oral, resp. rate 16, height 6\' 2"  (1.88 m), weight 104.3 kg (230 lb), SpO2 100 %.  1.  General: Appears in no acute distress  2. Psychiatric: Alert, awake but not oriented x3  3. Neurologic: No focal neurological deficits, all cranial nerves intact.Strength 5/5 all 4 extremities, sensation intact all 4 extremities, plantars down going.  4. Eyes :  anicteric sclerae, moist conjunctivae with no lid lag. PERRLA.  5. ENMT:  Oropharynx clear with moist mucous membranes and good dentition  6. Neck:  supple, no cervical lymphadenopathy appriciated, No thyromegaly  7. Respiratory : Normal respiratory effort, good air movement bilaterally,clear to  auscultation bilaterally  8. Cardiovascular : RRR, no gallops, rubs or murmurs, no leg edema  9. Gastrointestinal:  Positive bowel sounds, abdomen soft, non-tender to palpation,no hepatosplenomegaly, no rigidity or guarding       10. Skin:  No cyanosis, normal texture and turgor, no rash, lesions or ulcers  11.Musculoskeletal:  Good muscle tone,  joints appear normal , no effusions,  normal range of motion    Data Review:    CBC Recent Labs  Lab 10/14/17 1454  WBC 5.5  HGB 10.6*  HCT 31.9*  PLT 213  MCV  87.9  MCH 29.2  MCHC 33.2  RDW 15.5  LYMPHSABS 1.4  MONOABS 0.5  EOSABS 0.0  BASOSABS 0.0   ------------------------------------------------------------------------------------------------------------------  Chemistries  Recent Labs  Lab 10/14/17 1454  NA 125*  K 4.5  CL 95*  CO2 21*  GLUCOSE 159*  BUN 23*  CREATININE 0.97  CALCIUM 8.8*  AST 20  ALT 13*  ALKPHOS 43  BILITOT 0.6   ------------------------------------------------------------------------------------------------------------------  ------------------------------------------------------------------------------------------------------------------  GFR: Estimated Creatinine Clearance: 97.7 mL/min (by C-G formula based on SCr of 0.97 mg/dL). Liver Function Tests: Recent Labs  Lab 10/14/17 1454  AST 20  ALT 13*  ALKPHOS 43  BILITOT 0.6  PROT 6.7  ALBUMIN 3.0*   No results for input(s): LIPASE, AMYLASE in the last 168 hours. No results for input(s): AMMONIA in the last 168 hours. Coagulation Profile: No results for input(s): INR, PROTIME in the last 168 hours. Cardiac Enzymes: Recent Labs  Lab 10/14/17 1454  CKTOTAL 83  TROPONINI <0.03    --------------------------------------------------------------------------------------------------------------- Urine analysis:    Component Value Date/Time   COLORURINE YELLOW 10/14/2017 [place] 10/14/2017 1615   APPEARANCEUR Cloudy 07/07/2014 1349   LABSPEC 1.019 10/14/2017 1615   LABSPEC 1.016 07/07/2014 1349   PHURINE 5.0 10/14/2017 1615   GLUCOSEU NEGATIVE 10/14/2017 1615   GLUCOSEU 50 mg/dL 07/07/2014 1349   HGBUR NEGATIVE 10/14/2017 1615   BILIRUBINUR NEGATIVE 10/14/2017 1615   BILIRUBINUR Negative 07/07/2014 1349   KETONESUR 5 (A) 10/14/2017 1615   PROTEINUR 30 (A) 10/14/2017 1615   UROBILINOGEN 1.0 03/29/2015 1957   NITRITE NEGATIVE 10/14/2017 1615   LEUKOCYTESUR NEGATIVE 10/14/2017 1615   LEUKOCYTESUR 3+ 07/07/2014 1349       Imaging Results:    Ct Head Wo Contrast  Result Date: 10/14/2017 CLINICAL DATA:  Lethargy, unwitnessed fall. EXAM: CT HEAD WITHOUT CONTRAST TECHNIQUE: Contiguous axial images were obtained from the base of the skull through the vertex without intravenous contrast. COMPARISON:  CT scan of March 23, 2016. FINDINGS: Brain: Mild diffuse cortical atrophy is noted. Mild chronic ischemic white matter disease is noted. No mass effect or midline shift is noted. Ventricular size is within normal limits. There is no evidence of mass lesion, hemorrhage or acute infarction. Vascular: No hyperdense vessel or unexpected calcification. Skull: Normal. Negative for fracture or focal lesion. Sinuses/Orbits: No acute finding. Other: None. IMPRESSION: Mild diffuse cortical atrophy. No acute intracranial abnormality seen. Electronically Signed   By: Marijo Conception, M.D.   On: 10/14/2017 16:04   Dg Chest Port 1 View  Result Date: 10/14/2017 CLINICAL DATA:  Weakness. EXAM: PORTABLE CHEST 1 VIEW COMPARISON:  Radiographs of March 28, 2015. FINDINGS: Stable cardiomediastinal silhouette. Hypoinflation of the lungs is noted. Stable elevated left hemidiaphragm compared to prior exam. Minimal bibasilar subsegmental atelectasis is noted. No pneumothorax or pleural effusion is noted. Bony thorax is unremarkable. IMPRESSION: Hypoinflation of the lungs with minimal bibasilar subsegmental atelectasis. Stable elevated left hemidiaphragm. Electronically Signed   By: Marijo Conception, M.D.   On: 10/14/2017 15:26    My personal review of EKG: Rhythm NSR   Assessment & Plan:    Active Problems:   Generalized weakness   1. Generalized weakness/altered mental status-unclear etiology, likely medication induced.  CT head shows no significant abnormality.  Patient is on multiple psychotropic medications but has been taking these medications for a long time.  Will obtain PT evaluation in a.m.  Started on gentle IV hydration with  normal saline. 2. Acute on chronic hyponatremia-patient has chronic hyponatremia with sodium usually around 130, today sodium is 125.  Check serum osmolality.  Start gentle IV hydration with normal saline at 75 mL/h.  Follow BMP in a.m. 3. Schizoaffective disorder-continue Seroquel, Depakote, Cogentin 4. Hyperlipidemia-continue Zocor 5. Hypertension-continue Norvasc, Toprol-XL 6. Diabetes mellitus-hold metformin, will start sliding scale insulin with NovoLog.    DVT Prophylaxis-   Lovenox   AM Labs Ordered, also please review Full Orders  Family  Communication: No family at bedside  Code Status: Full code  Admission status: Inpatient  Time spent in minutes : 60 minutes   Oswald Hillock M.D on 10/14/2017 at 8:10 PM  Between 7am to 7pm - Pager - [phone]. After 7pm go to [email or web address] - password [facility]  [facility] - Office  [phone]

## 2017-10-14 NOTE — ED Triage Notes (Signed)
Pt brought from [facility] for increased lethargy.  Was found in the floor by his roommate this morning.

## 2017-10-14 NOTE — ED Provider Notes (Signed)
[facility] EMERGENCY DEPARTMENT Provider Note   CSN: [id number] Arrival date & time: 10/14/17  1409     History   Chief Complaint Chief Complaint  Patient presents with  . Altered Mental Status    HPI Scott [name] is a 67 y.o. male.  Chief complaint is "lethargic".  HPI 67 year old male.  History of MR, chronic hyponatremia, recent UTI, schizophrenia-on meds.  Resident at a group home.  Apparently he is less active than usual today.  Facility was concerned and referred him here via EMS.  Apparently he was on the floor this morning but was able to get himself back into bed.  Uncertain if this were fall, or how long he was on floor.  No apparent injuries or complaint of pain.  No vomiting.  No bladder injuries.  5 days ago had E. coli UTI.  It was sensitive to the Keflex he was placed on  Past Medical History:  Diagnosis Date  . Anxiety   . Chronic hyponatremia 05/2013, 11/2013  . Dementia   . Dyslipidemia   . GERD (gastroesophageal reflux disease)   . Hypertension   . Hypoglycemia   . Schizophrenia ([place])   . Seizures ([place])   . Syncope   . Tardive dyskinesia   . Urinary tract infection   . Vitamin D deficiency     Patient Active Problem List   Diagnosis Date Noted  . Schizoaffective disorder, bipolar type ([place]) 02/07/2016  . Diabetes ([place]) 02/07/2016  . Schizophrenia ([place]) 01/24/2016  . GERD (gastroesophageal reflux disease) 01/24/2016  . Tardive dyskinesia 01/24/2016  . Chronic hyponatremia 01/24/2016  . Hypertension 01/24/2016    Past Surgical History:  Procedure Laterality Date  . CATARACT EXTRACTION W/PHACO Left 01/10/2016   Procedure: CATARACT EXTRACTION PHACO AND INTRAOCULAR LENS PLACEMENT (IOC);  Surgeon: Birder Robson, MD;  Location: [facility] ORS;  Service: Ophthalmology;  Laterality: Left;  [place] 00:53 AP% 24.2 CDE 12.88 fluid pack lot # [id number]  . EYE SURGERY    . FOOT SURGERY          Home Medications    Prior to Admission medications     Medication Sig Start Date End Date Taking? Authorizing Provider  acetaminophen (TYLENOL) 500 MG tablet Take 500 mg by mouth every 6 (six) hours as needed.    [provider]  amLODipine (NORVASC) 10 MG tablet Take 10 mg by mouth daily.    [provider]  benztropine (COGENTIN) 0.5 MG tablet Take 0.5 mg by mouth 2 (two) times daily.    [provider]  cephALEXin (KEFLEX) 500 MG capsule Take 1 capsule (500 mg total) by mouth 4 (four) times daily. 10/08/17   Fransico Meadow, PA-C  cetirizine (ZYRTEC) 10 MG tablet Take 10 mg by mouth daily.    [provider]  divalproex (DEPAKOTE) 500 MG DR tablet Take 2,000 mg by mouth at bedtime.    [provider]  docusate sodium (COLACE) 100 MG capsule Take 100 mg by mouth 2 (two) times daily.    [provider]  haloperidol decanoate (HALDOL DECANOATE) 50 MG/ML injection Inject 100 mg into the muscle every 28 (twenty-eight) days.    [provider]  metFORMIN (GLUCOPHAGE) 850 MG tablet Take 1 tablet (850 mg total) by mouth 2 (two) times daily with a meal. 06/08/16   Daymon Larsen, MD  metoprolol succinate (TOPROL-XL) 50 MG 24 hr tablet Take 50 mg by mouth daily. Take with or immediately following a meal.  [provider]  QUEtiapine (SEROQUEL) 200 MG tablet Take 200 mg by mouth at bedtime.    [provider]  QUEtiapine (SEROQUEL) 25 MG tablet Take 25 mg by mouth daily as needed. Care giver states he also takes Seroquel 200mg  at bedtime    [provider]  ranitidine (ZANTAC) 75 MG tablet Take 75 mg by mouth 2 (two) times daily.    [provider]  simvastatin (ZOCOR) 20 MG tablet Take 20 mg by mouth daily.    [provider]  Skin Protectants, Misc. (BAZA PROTECT EX) Apply 1 application topically daily as needed.    [provider]  tamsulosin (FLOMAX) 0.4 MG CAPS capsule Take 0.4 mg by mouth daily after breakfast.    [provider]   Vitamin D, Ergocalciferol, (DRISDOL) 50000 units CAPS capsule Take 50,000 Units by mouth every 7 (seven) days.    [provider]    Family History No family history on file.  Social History Social History   Tobacco Use  . Smoking status: Current Every Day Smoker    Packs/day: 0.50    Types: Cigarettes  . Smokeless tobacco: Never Used  Substance Use Topics  . Alcohol use: No  . Drug use: No     Allergies   Patient has no known allergies.   Review of Systems Review of Systems  Constitutional: Positive for activity change. Negative for appetite change, chills, diaphoresis, fatigue and fever.  HENT: Negative for mouth sores, sore throat and trouble swallowing.   Eyes: Negative for visual disturbance.  Respiratory: Negative for cough, chest tightness, shortness of breath and wheezing.   Cardiovascular: Negative for chest pain.  Gastrointestinal: Negative for abdominal distention, abdominal pain, diarrhea, nausea and vomiting.  Endocrine: Negative for polydipsia, polyphagia and polyuria.  Genitourinary: Negative for dysuria, frequency and hematuria.  Musculoskeletal: Negative for gait problem.  Skin: Negative for color change, pallor and rash.  Neurological: Negative for dizziness, syncope, light-headedness and headaches.  Hematological: Does not bruise/bleed easily.  Psychiatric/Behavioral: Negative for behavioral problems and confusion.     Physical Exam Updated Vital Signs BP 105/76   Pulse 65   Temp (!) 97.4 F (36.3 C) (Oral)   Resp 14   Ht 6\' 2"  (1.88 m)   Wt 104.3 kg (230 lb)   SpO2 100%   BMI 29.53 kg/m   Physical Exam  Constitutional: He is oriented to person, place, and time. He appears well-developed and well-nourished. No distress.  Awake and alert.  No obvious debated movements of his mouth or face.  Does have a history of tardive dyskinesia.  Protruding tongue.  Mucous membranes moist.  HENT:  Head: Normocephalic.  Eyes: Pupils are  equal, round, and reactive to light. Conjunctivae are normal. No scleral icterus.  Neck: Normal range of motion. Neck supple. No thyromegaly present.  Cardiovascular: Normal rate and regular rhythm. Exam reveals no gallop and no friction rub.  No murmur heard. Pulmonary/Chest: Effort normal and breath sounds normal. No respiratory distress. He has no wheezes. He has no rales.  Abdominal: Soft. Bowel sounds are normal. He exhibits no distension. There is no tenderness. There is no rebound.  Musculoskeletal: Normal range of motion.  Neurological: He is alert and oriented to person, place, and time.  Moving all 4 without deficit.  Awake alert.  Skin: Skin is warm and dry. No rash noted.  Psychiatric: He has a normal mood and affect. His behavior is normal.     ED Treatments / Results  Labs (all labs ordered are listed, but only abnormal results are displayed) Labs Reviewed  COMPREHENSIVE METABOLIC PANEL - Abnormal; Notable for the following components:      Result Value   Sodium 125 (*)    Chloride 95 (*)    CO2 21 (*)    Glucose, Bld 159 (*)    BUN 23 (*)    Calcium 8.8 (*)    Albumin 3.0 (*)    ALT 13 (*)    All other components within normal limits  CBC WITH DIFFERENTIAL/PLATELET - Abnormal; Notable for the following components:   RBC 3.63 (*)    Hemoglobin 10.6 (*)    HCT 31.9 (*)    All other components within normal limits  URINALYSIS, ROUTINE W REFLEX MICROSCOPIC - Abnormal; Notable for the following components:   Ketones, ur 5 (*)    Protein, ur 30 (*)    All other components within normal limits  CK  TROPONIN I  VALPROIC ACID LEVEL    EKG None  Radiology Ct Head Wo Contrast  Result Date: 10/14/2017 CLINICAL DATA:  Lethargy, unwitnessed fall. EXAM: CT HEAD WITHOUT CONTRAST TECHNIQUE: Contiguous axial images were obtained from the base of the skull through the vertex without intravenous contrast. COMPARISON:  CT scan of March 23, 2016. FINDINGS: Brain: Mild  diffuse cortical atrophy is noted. Mild chronic ischemic white matter disease is noted. No mass effect or midline shift is noted. Ventricular size is within normal limits. There is no evidence of mass lesion, hemorrhage or acute infarction. Vascular: No hyperdense vessel or unexpected calcification. Skull: Normal. Negative for fracture or focal lesion. Sinuses/Orbits: No acute finding. Other: None. IMPRESSION: Mild diffuse cortical atrophy. No acute intracranial abnormality seen. Electronically Signed   By: Marijo Conception, M.D.   On: 10/14/2017 16:04   Dg Chest Port 1 View  Result Date: 10/14/2017 CLINICAL DATA:  Weakness. EXAM: PORTABLE CHEST 1 VIEW COMPARISON:  Radiographs of March 28, 2015. FINDINGS: Stable cardiomediastinal silhouette. Hypoinflation of the lungs is noted. Stable elevated left hemidiaphragm compared to prior exam. Minimal bibasilar subsegmental atelectasis is noted. No pneumothorax or pleural effusion is noted. Bony thorax is unremarkable. IMPRESSION: Hypoinflation of the lungs with minimal bibasilar subsegmental atelectasis. Stable elevated left hemidiaphragm. Electronically Signed   By: Marijo Conception, M.D.   On: 10/14/2017 15:26    Procedures Procedures (including critical care time)  Medications Ordered in ED Medications - No data to display   Initial Impression / Assessment and Plan / ED Course  I have reviewed the triage vital signs and the nursing notes.  Pertinent labs & imaging results that were available during my care of the patient were reviewed by me and considered in my medical decision making (see chart for details).    Afebrile.  Not tachycardic.  Does not appear acutely ill.  Will recheck labs including urine.  CT since he was found on the floor although he has no apparent injuries.  CPK.  Reevaluation.  6:36 PM: Patient intermittently sleeping.  At times requires stimulation to awake.  When fully awake have set him on the edge of the bed and attempted  to ambulate him.  He is unable to stand and with my assistance.  In discussion with the facility per our staff they were attempting to make arrangements for him to go to [facility] as he has been requiring more assistance at the facility.  He is hyponatremic which could explain weakness and difficulty with coordination.  However  he is chronically slightly under 130.  This is not a marked change from his baseline.  His urine does not appear infected.  His CPK is not elevated.  His CT does not show acute changes.  He has symmetric and intact reflexes.  He does not have back pain leg pain or any neurological symptoms that was just a cause for his difficulty with ambulation.  There have been no recent medication changes.  He is on multiple sedating medications including double Haldol, high-dose Seroquel.  His Depakote is 84, mid range/midtherapeutic.  Although his difficulty with ambulation and weakness may be subacute he cannot return to a group home as he would be required to be independent with ambulation and only required minimal assist with ADLs.  Will discuss with hospitalist regarding admission.  Final Clinical Impressions(s) / ED Diagnoses   Final diagnoses:  Weakness  Hyponatremia    ED Discharge Orders    None       Tanna Furry, MD 10/14/17 1839

## 2017-10-15 ENCOUNTER — Encounter (HOSPITAL_COMMUNITY): Payer: Self-pay

## 2017-10-15 ENCOUNTER — Other Ambulatory Visit: Payer: Self-pay

## 2017-10-15 LAB — CBC
HCT: 32.4 % — ABNORMAL LOW (ref 39.0–52.0)
HEMOGLOBIN: 10.6 g/dL — AB (ref 13.0–17.0)
MCH: 28.8 pg (ref 26.0–34.0)
MCHC: 32.7 g/dL (ref 30.0–36.0)
MCV: 88 fL (ref 78.0–100.0)
PLATELETS: 214 10*3/uL (ref 150–400)
RBC: 3.68 MIL/uL — AB (ref 4.22–5.81)
RDW: 15.4 % (ref 11.5–15.5)
WBC: 4.3 10*3/uL (ref 4.0–10.5)

## 2017-10-15 LAB — OSMOLALITY: OSMOLALITY: 281 mosm/kg (ref 275–295)

## 2017-10-15 LAB — GLUCOSE, CAPILLARY
GLUCOSE-CAPILLARY: 95 mg/dL (ref 65–99)
GLUCOSE-CAPILLARY: 96 mg/dL (ref 65–99)
Glucose-Capillary: 125 mg/dL — ABNORMAL HIGH (ref 65–99)
Glucose-Capillary: 136 mg/dL — ABNORMAL HIGH (ref 65–99)

## 2017-10-15 LAB — COMPREHENSIVE METABOLIC PANEL
ALK PHOS: 44 U/L (ref 38–126)
ALT: 14 U/L — AB (ref 17–63)
AST: 16 U/L (ref 15–41)
Albumin: 2.6 g/dL — ABNORMAL LOW (ref 3.5–5.0)
Anion gap: 7 (ref 5–15)
BUN: 17 mg/dL (ref 6–20)
CHLORIDE: 98 mmol/L — AB (ref 101–111)
CO2: 24 mmol/L (ref 22–32)
Calcium: 8.4 mg/dL — ABNORMAL LOW (ref 8.9–10.3)
Creatinine, Ser: 0.78 mg/dL (ref 0.61–1.24)
GFR calc Af Amer: >60 mL/min (ref >60)
Glucose, Bld: 127 mg/dL — ABNORMAL HIGH (ref 65–99)
Potassium: 4.1 mmol/L (ref 3.5–5.1)
Sodium: 129 mmol/L — ABNORMAL LOW (ref 135–145)
Total Bilirubin: 0.5 mg/dL (ref 0.3–1.2)
Total Protein: 6.1 g/dL — ABNORMAL LOW (ref 6.5–8.1)

## 2017-10-15 LAB — BASIC METABOLIC PANEL
ANION GAP: 10 (ref 5–15)
BUN: 18 mg/dL (ref 6–20)
CHLORIDE: 98 mmol/L — AB (ref 101–111)
CO2: 23 mmol/L (ref 22–32)
Calcium: 8.9 mg/dL (ref 8.9–10.3)
Creatinine, Ser: 0.86 mg/dL (ref 0.61–1.24)
GFR calc non Af Amer: >60 mL/min (ref >60)
Glucose, Bld: 112 mg/dL — ABNORMAL HIGH (ref 65–99)
Potassium: 4.2 mmol/L (ref 3.5–5.1)
Sodium: 131 mmol/L — ABNORMAL LOW (ref 135–145)

## 2017-10-15 LAB — HEMOGLOBIN A1C
Hgb A1c MFr Bld: 5.5 % (ref 4.8–5.6)
Mean Plasma Glucose: 111.15 mg/dL

## 2017-10-15 NOTE — Care Management (Signed)
CM consult for pt's group home not wanting to take him back. This is a social issue, social work consult placed, CM consult [place].

## 2017-10-15 NOTE — Clinical Social Work Note (Signed)
Late entry for 10/14/17  Clinical Social Work Assessment  Patient Details  Name: Scott [name] MRN: [id number] Date of Birth: [birth year]  Date of referral:  10/15/17               Reason for consult:  Facility Placement                Permission sought to share information with:    Permission granted to share information::     Name::        Agency::     Relationship::     Contact Information:  Kizzie Ide [facility]  Housing/Transportation Living arrangements for the past 2 months:  [place]) Source of Information:  Guardian Patient Interpreter Needed:  None Criminal Activity/Legal Involvement Pertinent to Current Situation/Hospitalization:  No - Comment as needed Significant Relationships:  None Lives with:  Facility Resident Do you feel safe going back to the place where you live?  No Need for family participation in patient care:  Yes (Comment)  Care giving concerns:  Patient is in need of a higher level of care per [name], [place] Worker assessment / plan:  [name] with [facility] contacted CSW and reported that patient was being brought to the ED. She stated that last week, she began speaking with [facility] in regards to patient needing a higher level of care. She stated that patient's PCP is out of town currently and was not available to do the needed paperwork for patient to be admitted to a higher level of care.   Employment status:  Disabled (Comment on whether or not currently receiving Disability) Insurance information:  Medicare, Medicaid In [place] PT Recommendations:  Not assessed at this time Information / Referral to community resources:     Patient/Family's Response to care:  Legal guardian is seeking for patient to go to [facility].   Patient/Family's Understanding of and Emotional Response to Diagnosis, Current Treatment, and Prognosis:  Legal guardian understands patient's  diagnosis, treatment and prognosis and based on this knowledge made the decision to seek long term SNF placement for patient.   Emotional Assessment Appearance:  Appears stated age Attitude/Demeanor/Rapport:    Affect (typically observed):  Other, Unable to Assess Orientation:  Oriented to Self, Oriented to Place Alcohol / Substance use:  Not Applicable Psych involvement (Current and /or in the community):  No (Comment)  Discharge Needs  Concerns to be addressed:  Discharge Planning Concerns Readmission within the last 30 days:  No Current discharge risk:  None Barriers to Discharge:  Awaiting State Approval ([place])   Ihor Gully, LCSW 10/15/2017, 9:39 AM

## 2017-10-15 NOTE — Progress Notes (Signed)
Copy of advanced directive placed into chart.

## 2017-10-15 NOTE — NC FL2 (Signed)
[place] LEVEL OF CARE SCREENING TOOL     IDENTIFICATION  Patient Name: Scott [name] Birthdate: May 10, 1951 Sex: male Admission Date (Current Location): 10/14/2017  [place] and [place] Number:  [facility] [id number] [place] and Address:  [facility] [address], [place]      Provider Number: [id number]  Attending Physician Name and Address:  Kayleen Memos, DO  Relative Name and Phone Number:       Current Level of Care: Hospital Recommended Level of Care: [place] Prior Approval Number:    Date Approved/Denied:   PASRR Number:    Discharge Plan: SNF    Current Diagnoses: Patient Active Problem List   Diagnosis Date Noted  . Generalized weakness 10/14/2017  . Schizoaffective disorder, bipolar type ([place]) 02/07/2016  . Diabetes ([place]) 02/07/2016  . Schizophrenia ([place]) 01/24/2016  . GERD (gastroesophageal reflux disease) 01/24/2016  . Tardive dyskinesia 01/24/2016  . Chronic hyponatremia 01/24/2016  . Hypertension 01/24/2016    Orientation RESPIRATION BLADDER Height & Weight     Self, Place  Normal Incontinent Weight: 230 lb (104.3 kg) Height:  6\' 2"  (188 cm)  BEHAVIORAL SYMPTOMS/MOOD NEUROLOGICAL BOWEL NUTRITION STATUS      Incontinent Diet(regular)  AMBULATORY STATUS COMMUNICATION OF NEEDS Skin   Limited Assist Verbally Normal                       Personal Care Assistance Level of Assistance  Bathing, Feeding, Dressing Bathing Assistance: Limited assistance Feeding assistance: Limited assistance Dressing Assistance: Limited assistance     Functional Limitations Info  Sight, Hearing, Speech Sight Info: Adequate Hearing Info: Adequate Speech Info: Adequate    SPECIAL CARE FACTORS FREQUENCY  PT (By licensed PT)     PT Frequency: 5x/week              Contractures Contractures Info: Not present    Additional Factors Info  Code Status, Allergies, Psychotropic Code Status  Info: Full code Allergies Info: NKA Psychotropic Info: Seroquel, Depakote, Cogentin, Haldol Decanoate         Current Medications (10/15/2017):  This is the current hospital active medication list Current Facility-Administered Medications  Medication Dose Route Frequency Provider Last Rate Last Dose  . 0.9 %  sodium chloride infusion   Intravenous Continuous Oswald Hillock, MD 75 mL/hr at 10/15/17 1137    . acetaminophen (TYLENOL) tablet 500 mg  500 mg Oral Q6H PRN Oswald Hillock, MD   500 mg at 10/14/17 2357  . amLODipine (NORVASC) tablet 5 mg  5 mg Oral Daily Oswald Hillock, MD   5 mg at 10/15/17 0857  . benztropine (COGENTIN) tablet 0.5 mg  0.5 mg Oral BID Oswald Hillock, MD   0.5 mg at 10/15/17 0856  . divalproex (DEPAKOTE) DR tablet 2,000 mg  2,000 mg Oral QHS Oswald Hillock, MD   2,000 mg at 10/15/17 0010  . enoxaparin (LOVENOX) injection 40 mg  40 mg Subcutaneous Q24H Oswald Hillock, MD   40 mg at 10/15/17 0856  . famotidine (PEPCID) tablet 20 mg  20 mg Oral Daily Oswald Hillock, MD   20 mg at 10/15/17 0857  . insulin aspart (novoLOG) injection 0-9 Units  0-9 Units Subcutaneous TID WC Oswald Hillock, MD   1 Units at 10/15/17 1137  . loratadine (CLARITIN) tablet 10 mg  10 mg Oral Daily Oswald Hillock, MD   10 mg at 10/15/17 0858  .  metoprolol succinate (TOPROL-XL) 24 hr tablet 50 mg  50 mg Oral BID Oswald Hillock, MD   50 mg at 10/15/17 0856  . ondansetron (ZOFRAN) tablet 4 mg  4 mg Oral Q6H PRN Oswald Hillock, MD       Or  . ondansetron (ZOFRAN) injection 4 mg  4 mg Intravenous Q6H PRN Oswald Hillock, MD      . QUEtiapine (SEROQUEL) tablet 200 mg  200 mg Oral QHS Oswald Hillock, MD   200 mg at 10/14/17 2351  . simvastatin (ZOCOR) tablet 20 mg  20 mg Oral Daily Oswald Hillock, MD   20 mg at 10/15/17 3013     Discharge Medications: Please see discharge summary for a list of discharge medications.  Relevant Imaging Results:  Relevant Lab Results:   Additional Information SSN [id number] Clydene Pugh, LCSW

## 2017-10-15 NOTE — Plan of Care (Signed)
  Problem: Acute Rehab PT Goals(only PT should resolve) Goal: Pt Will Go Supine/Side To Sit Outcome: Progressing Flowsheets (Taken 10/15/2017 1138) Pt will go Supine/Side to Sit: with supervision Goal: Patient Will Transfer Sit To/From Stand Outcome: Progressing Flowsheets (Taken 10/15/2017 1138) Patient will transfer sit to/from stand: with min guard assist Goal: Pt Will Transfer Bed To Chair/Chair To Bed Outcome: Progressing Flowsheets (Taken 10/15/2017 1138) Pt will Transfer Bed to Chair/Chair to Bed: with min assist Goal: Pt Will Ambulate Outcome: Progressing Flowsheets (Taken 10/15/2017 1138) Pt will Ambulate: with minimal assist;50 feet;with rolling walker  11:39 AM, 10/15/17 Lonell Grandchild, MPT Physical Therapist with [facility] [phone] office [phone] mobile phone

## 2017-10-15 NOTE — Evaluation (Signed)
Physical Therapy Evaluation Patient Details Name: Scott [name] MRN: [id number] DOB: 08-Feb-1951 Today's Date: 10/15/2017   History of Present Illness  Scott [name]  is a 67 y.o. male, with history of dementia, GERD, dyslipidemia, chronic hyponatremia, schizoaffective disorder, bipolar type, tardive dyskinesia, hypertension was brought to hospital with complaints of altered mental status lethargy, generalized weakness.  Patient had recent UTI and was treated with Keflex.  Patient was found apparently on the floor this morning and was able to get back to bed by himself.    Clinical Impression  Patient demonstrates labored movement for sitting up, attempts to launch self up when scooting laterally and when completing sit to stands with tendency to lean to the left, requires verbal/tactile cueing for patient to keep feet shoulder width apart when standing, once walking patient limited due taking very short steps and keeping feet to close together causing fall risk.  Patient tolerated sitting up in chair after therapy - RN/NT notified.  Patient will benefit from continued physical therapy in hospital and recommended venue below to increase strength, balance, endurance for safe ADLs and gait.   Follow Up Recommendations SNF;Supervision/Assistance - 24 hour    Equipment Recommendations  None recommended by PT    Recommendations for Other Services       Precautions / Restrictions Precautions Precautions: Fall Restrictions Weight Bearing Restrictions: No      Mobility  Bed Mobility Overal bed mobility: Needs Assistance Bed Mobility: Supine to Sit;Sit to Supine     Supine to sit: Min guard Sit to supine: Min guard      Transfers Overall transfer level: Needs assistance Equipment used: Rolling walker (2 wheeled);None Transfers: Sit to/from [facility] Sit to Stand: Mod assist Stand pivot transfers: Min assist;Mod assist       General transfer comment:  labored movement with veering to the left during sit to stands, poor coordination of BLE  Ambulation/Gait Ambulation/Gait assistance: Mod assist Ambulation Distance (Feet): 25 Feet Assistive device: Rolling walker (2 wheeled) Gait Pattern/deviations: Decreased step length - right;Decreased step length - left;Decreased stride length;Scissoring;Festinating Gait velocity: slow   General Gait Details: demonstrates slow labored cadence with feet close together, very short festinating like steps once fatigued, limited due to c/o fatigue, poor standing balance  Stairs            Wheelchair Mobility    Modified Rankin (Stroke Patients Only)       Balance Overall balance assessment: Needs assistance Sitting-balance support: Feet supported;No upper extremity supported Sitting balance-Leahy Scale: Good     Standing balance support: During functional activity;No upper extremity supported Standing balance-Leahy Scale: Poor Standing balance comment: fair/poor with RW                             Pertinent Vitals/Pain Pain Assessment: No/denies pain    Home Living Family/patient expects to be discharged to:: Assisted living               Home Equipment: Walker - 2 wheels Additional Comments: info per patient    Prior Function Level of Independence: Needs assistance   Gait / Transfers Assistance Needed: household gait with RW PRN, "per patient"  ADL's / Homemaking Assistance Needed: assisted by ALF staff        Hand Dominance        Extremity/Trunk Assessment   Upper Extremity Assessment Upper Extremity Assessment: Generalized weakness    Lower Extremity Assessment Lower Extremity Assessment:  Generalized weakness    Cervical / Trunk Assessment Cervical / Trunk Assessment: Normal  Communication   Communication: No difficulties  Cognition Arousal/Alertness: Awake/alert Behavior During Therapy: WFL for tasks assessed/performed Overall Cognitive  Status: History of cognitive impairments - at baseline                                        General Comments      Exercises     Assessment/Plan    PT Assessment Patient needs continued PT services  PT Problem List Decreased strength;Decreased activity tolerance;Decreased balance;Decreased mobility       PT Treatment Interventions Gait training;Functional mobility training;Therapeutic activities;Therapeutic exercise;Patient/family education;Stair training    PT Goals (Current goals can be found in the Care Plan section)  Acute Rehab PT Goals Patient Stated Goal: return home after rehab PT Goal Formulation: With patient Time For Goal Achievement: 10/29/17 Potential to Achieve Goals: Good    Frequency Min 3X/week   Barriers to discharge        Co-evaluation               AM-PAC PT "6 Clicks" Daily Activity  Outcome Measure Difficulty turning over in bed (including adjusting bedclothes, sheets and blankets)?: A Little Difficulty moving from lying on back to sitting on the side of the bed? : A Little Difficulty sitting down on and standing up from a chair with arms (e.g., wheelchair, bedside commode, etc,.)?: A Lot Help needed moving to and from a bed to chair (including a wheelchair)?: A Lot Help needed walking in hospital room?: A Lot Help needed climbing 3-5 steps with a railing? : Total 6 Click Score: 13    End of Session Equipment Utilized During Treatment: Gait belt Activity Tolerance: Patient tolerated treatment well;Patient limited by fatigue Patient left: in chair;with call bell/phone within reach;with chair alarm set Nurse Communication: Mobility status;Other (comment)(RN notified that patient left up in chair) PT Visit Diagnosis: Unsteadiness on feet (R26.81);Other abnormalities of gait and mobility (R26.89);Muscle weakness (generalized) (M62.81)    Time: 9518-8416 PT Time Calculation (min) (ACUTE ONLY): 31 min   Charges:   PT  Evaluation $PT Eval Moderate Complexity: 1 Mod PT Treatments $Therapeutic Activity: 23-37 mins   PT G Codes:        11:37 AM, 11-08-2017 Lonell Grandchild, MPT Physical Therapist with [facility] 336 [phone] office [phone] mobile phone

## 2017-10-15 NOTE — Progress Notes (Addendum)
PROGRESS NOTE  Scott [name] FTD:[id number] DOB: April 18, 1951 DOA: 10/14/2017 PCP: Lauretta Grill, NP  HPI/Recap of past 24 hours: Scott [name]  is a 67 y.o. male, with history of dementia, GERD, dyslipidemia, chronic hyponatremia, schizoaffective disorder, bipolar type, tardive dyskinesia, hypertension was brought to hospital with complaints of altered mental status lethargy, generalized weakness.  Patient had recent UTI and was treated with Keflex.  Patient was found apparently on the floor this morning and was able to get back to bed by himself. He is poor historian and unable to provide any significant history. In the ED all the lab work yielded no significant abnormality. UA was clear, chest x-ray shows no infiltrate. Patient admitted for further evaluation for altered mental status/generalized weakness.  10/15/2017: Patient seen and examined at his bedside with his social worker present.  He is very talkative but does not answer questions appropriately.  She reports he is not back to his baseline mentation altered mental status most likely.  Assessment/Plan: Active Problems:   Chronic hyponatremia   Schizoaffective disorder, bipolar type (HCC)   Diabetes (HCC)   Generalized weakness  Acute metabolic encephalopathy most likely secondary to hypovolemic hyponatremia CT head unremarkable for any acute intracranial findings Hyponatremia is improving Serum sodium on presentation 125 Serum sodium today 10/15/2017 129 Continue gentle IV hydration with normal saline Repeat BMP at 2 PM and in the morning Continue to monitor closely for any acute changes  Chronic normocytic anemia Hemoglobin 10.6 which appears to be at baseline No overt sign of bleeding Repeat CBC in the morning  Physical debility/ambulatory dysfunction PT to evaluate Fall precautions  History of seizures Continue home medications  Schizoaffective disorder/bipolar Continue Seroquel  Hypertension Blood  pressure stable Continue home medication metoprolol succinate   Code Status: Full code  Family Communication: None at bedside.  His social worker present.  Disposition Plan: SNF when clinically stable   Consultants:  None  Procedures:  None  Antimicrobials:  None  DVT prophylaxis:   Subcu Lovenox  Objective: Vitals:   10/14/17 2030 10/14/17 2045 10/14/17 2100 10/15/17 0447  BP: 119/80  114/82 (!) 102/59  Pulse: 65 63 63 69  Resp: 12 13 13    Temp:    97.7 F (36.5 C)  TempSrc:    Oral  SpO2: 100% 100% 100% 100%  Weight:      Height:        Intake/Output Summary (Last 24 hours) at 10/15/2017 1239 Last data filed at 10/15/2017 0030 Gross per 24 hour  Intake 480 ml  Output 400 ml  Net 80 ml   Filed Weights   10/14/17 1415  Weight: 104.3 kg (230 lb)    Exam:  . General: 67 y.o. year-old male well developed well nourished in no acute distress.  . Cardiovascular: Regular rate and rhythm with no rubs or gallops.  No thyromegaly or JVD noted.   Marland Kitchen Respiratory: Clear to auscultation with no wheezes or rales. Good inspiratory effort. . Abdomen: Soft nontender nondistended with normal bowel sounds x4 quadrants. . Musculoskeletal: Trace edema in lower extremities. 2/4 pulses in all 4 extremities. . Skin: No ulcerative lesions noted or rashes, Psychiatry: unable ti assess due to altered mental status  Data Reviewed: CBC: Recent Labs  Lab 10/14/17 1454 10/15/17 0603  WBC 5.5 4.3  NEUTROABS 3.5  --   HGB 10.6* 10.6*  HCT 31.9* 32.4*  MCV 87.9 88.0  PLT 213 062   Basic Metabolic Panel: Recent Labs  Lab 10/14/17 1454 10/15/17  0603  NA 125* 129*  K 4.5 4.1  CL 95* 98*  CO2 21* 24  GLUCOSE 159* 127*  BUN 23* 17  CREATININE 0.97 0.78  CALCIUM 8.8* 8.4*   GFR: Estimated Creatinine Clearance: 118.5 mL/min (by C-G formula based on SCr of 0.78 mg/dL). Liver Function Tests: Recent Labs  Lab 10/14/17 1454 10/15/17 0603  AST 20 16  ALT 13* 14*    ALKPHOS 43 44  BILITOT 0.6 0.5  PROT 6.7 6.1*  ALBUMIN 3.0* 2.6*   No results for input(s): LIPASE, AMYLASE in the last 168 hours. No results for input(s): AMMONIA in the last 168 hours. Coagulation Profile: No results for input(s): INR, PROTIME in the last 168 hours. Cardiac Enzymes: Recent Labs  Lab 10/14/17 1454  CKTOTAL 83  TROPONINI <0.03   BNP (last 3 results) No results for input(s): PROBNP in the last 8760 hours. HbA1C: No results for input(s): HGBA1C in the last 72 hours. CBG: Recent Labs  Lab 10/15/17 0826 10/15/17 1129  GLUCAP 125* 136*   Lipid Profile: No results for input(s): CHOL, HDL, LDLCALC, TRIG, CHOLHDL, LDLDIRECT in the last 72 hours. Thyroid Function Tests: No results for input(s): TSH, T4TOTAL, FREET4, T3FREE, THYROIDAB in the last 72 hours. Anemia Panel: No results for input(s): VITAMINB12, FOLATE, FERRITIN, TIBC, IRON, RETICCTPCT in the last 72 hours. Urine analysis:    Component Value Date/Time   COLORURINE YELLOW 10/14/2017 1615   APPEARANCEUR CLEAR 10/14/2017 1615   APPEARANCEUR Cloudy 07/07/2014 1349   LABSPEC 1.019 10/14/2017 1615   LABSPEC 1.016 07/07/2014 1349   PHURINE 5.0 10/14/2017 1615   GLUCOSEU NEGATIVE 10/14/2017 1615   GLUCOSEU 50 mg/dL 07/07/2014 1349   HGBUR NEGATIVE 10/14/2017 1615   BILIRUBINUR NEGATIVE 10/14/2017 1615   BILIRUBINUR Negative 07/07/2014 1349   KETONESUR 5 (A) 10/14/2017 1615   PROTEINUR 30 (A) 10/14/2017 1615   UROBILINOGEN 1.0 03/29/2015 1957   NITRITE NEGATIVE 10/14/2017 1615   LEUKOCYTESUR NEGATIVE 10/14/2017 1615   LEUKOCYTESUR 3+ 07/07/2014 1349   Sepsis Labs: @LABRCNTIP (procalcitonin:4,lacticidven:4)  ) Recent Results (from the past 240 hour(s))  Urine Culture     Status: Abnormal   Collection Time: 10/08/17  8:52 AM  Result Value Ref Range Status   Specimen Description   Final    URINE, CATHETERIZED Performed at [facility], [address] [place], [place] [address]    Special  Requests   Final    NONE Performed at [facility], [address] [place], [place] [address]    Culture >=100,000 COLONIES/mL ESCHERICHIA COLI (A)  Final   Report Status 10/11/2017 FINAL  Final   Organism ID, Bacteria ESCHERICHIA COLI (A)  Final      Susceptibility   Escherichia coli - MIC*    AMPICILLIN >=32 RESISTANT Resistant     CEFAZOLIN <=4 SENSITIVE Sensitive     CEFTRIAXONE <=1 SENSITIVE Sensitive     CIPROFLOXACIN <=0.25 SENSITIVE Sensitive     GENTAMICIN <=1 SENSITIVE Sensitive     IMIPENEM <=0.25 SENSITIVE Sensitive     NITROFURANTOIN 64 INTERMEDIATE Intermediate     TRIMETH/SULFA <=20 SENSITIVE Sensitive     AMPICILLIN/SULBACTAM 4 SENSITIVE Sensitive     PIP/TAZO <=4 SENSITIVE Sensitive     Extended ESBL NEGATIVE Sensitive     * >=100,000 COLONIES/mL ESCHERICHIA COLI      Studies: Ct Head Wo Contrast  Result Date: 10/14/2017 CLINICAL DATA:  Lethargy, unwitnessed fall. EXAM: CT HEAD WITHOUT CONTRAST TECHNIQUE: Contiguous axial images were obtained from the base of the  skull through the vertex without intravenous contrast. COMPARISON:  CT scan of March 23, 2016. FINDINGS: Brain: Mild diffuse cortical atrophy is noted. Mild chronic ischemic white matter disease is noted. No mass effect or midline shift is noted. Ventricular size is within normal limits. There is no evidence of mass lesion, hemorrhage or acute infarction. Vascular: No hyperdense vessel or unexpected calcification. Skull: Normal. Negative for fracture or focal lesion. Sinuses/Orbits: No acute finding. Other: None. IMPRESSION: Mild diffuse cortical atrophy. No acute intracranial abnormality seen. Electronically Signed   By: Marijo Conception, M.D.   On: 10/14/2017 16:04   Dg Chest Port 1 View  Result Date: 10/14/2017 CLINICAL DATA:  Weakness. EXAM: PORTABLE CHEST 1 VIEW COMPARISON:  Radiographs of March 28, 2015. FINDINGS: Stable cardiomediastinal silhouette. Hypoinflation of the lungs is noted. Stable  elevated left hemidiaphragm compared to prior exam. Minimal bibasilar subsegmental atelectasis is noted. No pneumothorax or pleural effusion is noted. Bony thorax is unremarkable. IMPRESSION: Hypoinflation of the lungs with minimal bibasilar subsegmental atelectasis. Stable elevated left hemidiaphragm. Electronically Signed   By: Marijo Conception, M.D.   On: 10/14/2017 15:26    Scheduled Meds: . amLODipine  5 mg Oral Daily  . benztropine  0.5 mg Oral BID  . divalproex  2,000 mg Oral QHS  . enoxaparin (LOVENOX) injection  40 mg Subcutaneous Q24H  . famotidine  20 mg Oral Daily  . insulin aspart  0-9 Units Subcutaneous TID WC  . loratadine  10 mg Oral Daily  . metoprolol succinate  50 mg Oral BID  . QUEtiapine  200 mg Oral QHS  . simvastatin  20 mg Oral Daily    Continuous Infusions: . sodium chloride 75 mL/hr at 10/15/17 1137     LOS: 1 day     Kayleen Memos, MD [facility] Pager [phone]  If 7PM-7AM, please contact night-coverage [email or web address] Password [facility] 10/15/2017, 12:39 PM

## 2017-10-16 ENCOUNTER — Encounter (HOSPITAL_COMMUNITY): Payer: Self-pay

## 2017-10-16 DIAGNOSIS — F250 Schizoaffective disorder, bipolar type: Secondary | ICD-10-CM

## 2017-10-16 DIAGNOSIS — E871 Hypo-osmolality and hyponatremia: Principal | ICD-10-CM

## 2017-10-16 DIAGNOSIS — R531 Weakness: Secondary | ICD-10-CM

## 2017-10-16 LAB — CBC
HEMATOCRIT: 31.5 % — AB (ref 39.0–52.0)
Hemoglobin: 10.2 g/dL — ABNORMAL LOW (ref 13.0–17.0)
MCH: 28.7 pg (ref 26.0–34.0)
MCHC: 32.4 g/dL (ref 30.0–36.0)
MCV: 88.5 fL (ref 78.0–100.0)
Platelets: 198 10*3/uL (ref 150–400)
RBC: 3.56 MIL/uL — AB (ref 4.22–5.81)
RDW: 15.5 % (ref 11.5–15.5)
WBC: 5.8 10*3/uL (ref 4.0–10.5)

## 2017-10-16 LAB — BASIC METABOLIC PANEL
Anion gap: 3 — ABNORMAL LOW (ref 5–15)
BUN: 15 mg/dL (ref 6–20)
CALCIUM: 8.3 mg/dL — AB (ref 8.9–10.3)
CO2: 26 mmol/L (ref 22–32)
CREATININE: 0.73 mg/dL (ref 0.61–1.24)
Chloride: 100 mmol/L — ABNORMAL LOW (ref 101–111)
GFR calc non Af Amer: >60 mL/min (ref >60)
Glucose, Bld: 80 mg/dL (ref 65–99)
Potassium: 4.8 mmol/L (ref 3.5–5.1)
SODIUM: 129 mmol/L — AB (ref 135–145)

## 2017-10-16 LAB — GLUCOSE, CAPILLARY
GLUCOSE-CAPILLARY: 72 mg/dL (ref 65–99)
GLUCOSE-CAPILLARY: 143 mg/dL — AB (ref 65–99)
Glucose-Capillary: 142 mg/dL — ABNORMAL HIGH (ref 65–99)
Glucose-Capillary: 158 mg/dL — ABNORMAL HIGH (ref 65–99)

## 2017-10-16 LAB — HIV ANTIBODY (ROUTINE TESTING W REFLEX): HIV SCREEN 4TH GENERATION: NONREACTIVE

## 2017-10-16 LAB — TSH: TSH: 1.748 u[IU]/mL (ref 0.350–4.500)

## 2017-10-16 LAB — VITAMIN B12

## 2017-10-16 NOTE — Plan of Care (Signed)
Afebriel, iv site is within defined parameters.

## 2017-10-16 NOTE — Progress Notes (Signed)
PROGRESS NOTE    Scott [name]  WUJ:[id number] DOB: Nov 05, 1950 DOA: 10/14/2017 PCP: Lauretta Grill, NP     Brief Narrative:  67 year old man admitted from a group home on 5/13 due to acute encephalopathy and generalized weakness.  He has a history of dementia, GERD, hyperlipidemia, schizoaffective disorder.  Was recently treated for UTI with Keflex.  Was noted to be hyponatremic.   Assessment & Plan:   Active Problems:   Chronic hyponatremia   Schizoaffective disorder, bipolar type (HCC)   Diabetes (HCC)   Generalized weakness   Generalized weakness, acute metabolic encephalopathy -Appears to be close to baseline, is communicative although speech is sometimes difficult to understand with his tardive dyskinesia. -CT is negative for acute findings, hyponatremia although chronic was a little bit lower than baseline at 125 on presentation and is up to 129 on 5/15. -Check TSH and vitamin B12. -Has been evaluated by physical therapy with plans for SNF.  History of seizures -Seizure-free in the hospital, continue home meds (depakote)  Schizoaffective disorder -Continue Seroquel -Continue Cogentin for tardive dyskinesia.  Hypertension -Blood pressure has been stable, continue metoprolol   DVT prophylaxis: Lovenox Code Status: Full code Family Communication: Patient only Disposition Plan: Discharge to SNF over next 24 to 48 hours  Consultants:   None  Procedures:   None  Antimicrobials:  Anti-infectives (From admission, onward)   None       Subjective: Sitting in chair at bedside, feels well, seems alert and oriented, pleasant and talkative, has speech impediments due to tardive dyskinesia  Objective: Vitals:   10/15/17 1404 10/15/17 2143 10/16/17 0406 10/16/17 1307  BP: (!) 100/59 128/87 124/86 111/76  Pulse: 68 69 64 75  Resp: 20 20 20 16   Temp: 97.7 F (36.5 C) 97.6 F (36.4 C) 97.8 F (36.6 C) 98.4 F (36.9 C)  TempSrc:  Oral Oral Oral  SpO2:  (!) 73% 100% 100% 100%  Weight:      Height:        Intake/Output Summary (Last 24 hours) at 10/16/2017 1434 Last data filed at 10/16/2017 1300 Gross per 24 hour  Intake 2335 ml  Output 400 ml  Net 1935 ml   Filed Weights   10/14/17 1415  Weight: 104.3 kg (230 lb)    Examination:  General exam: Alert, awake, oriented x 3 Respiratory system: Clear to auscultation. Respiratory effort normal. Cardiovascular system:RRR. No murmurs, rubs, gallops. Gastrointestinal system: Abdomen is nondistended, soft and nontender. No organomegaly or masses felt. Normal bowel sounds heard. Central nervous system: Alert and oriented. No focal neurological deficits. Extremities: No C/C/E, +pedal pulses Skin: No rashes, lesions or ulcers Psychiatry: Judgement and insight appear normal. Mood & affect appropriate.     Data Reviewed: I have personally reviewed following labs and imaging studies  CBC: Recent Labs  Lab 10/14/17 1454 10/15/17 0603 10/16/17 0539  WBC 5.5 4.3 5.8  NEUTROABS 3.5  --   --   HGB 10.6* 10.6* 10.2*  HCT 31.9* 32.4* 31.5*  MCV 87.9 88.0 88.5  PLT 213 214 956   Basic Metabolic Panel: Recent Labs  Lab 10/14/17 1454 10/15/17 0603 10/15/17 1359 10/16/17 0539  NA 125* 129* 131* 129*  K 4.5 4.1 4.2 4.8  CL 95* 98* 98* 100*  CO2 21* 24 23 26   GLUCOSE 159* 127* 112* 80  BUN 23* 17 18 15   CREATININE 0.97 0.78 0.86 0.73  CALCIUM 8.8* 8.4* 8.9 8.3*   GFR: Estimated Creatinine Clearance: 118.5 mL/min (by C-G formula based  on SCr of 0.73 mg/dL). Liver Function Tests: Recent Labs  Lab 10/14/17 1454 10/15/17 0603  AST 20 16  ALT 13* 14*  ALKPHOS 43 44  BILITOT 0.6 0.5  PROT 6.7 6.1*  ALBUMIN 3.0* 2.6*   No results for input(s): LIPASE, AMYLASE in the last 168 hours. No results for input(s): AMMONIA in the last 168 hours. Coagulation Profile: No results for input(s): INR, PROTIME in the last 168 hours. Cardiac Enzymes: Recent Labs  Lab 10/14/17 1454    CKTOTAL 83  TROPONINI <0.03   BNP (last 3 results) No results for input(s): PROBNP in the last 8760 hours. HbA1C: Recent Labs    10/14/17 1454  HGBA1C 5.5   CBG: Recent Labs  Lab 10/15/17 1129 10/15/17 1627 10/15/17 2140 10/16/17 0818 10/16/17 1119  GLUCAP 136* 95 96 72 142*   Lipid Profile: No results for input(s): CHOL, HDL, LDLCALC, TRIG, CHOLHDL, LDLDIRECT in the last 72 hours. Thyroid Function Tests: No results for input(s): TSH, T4TOTAL, FREET4, T3FREE, THYROIDAB in the last 72 hours. Anemia Panel: No results for input(s): VITAMINB12, FOLATE, FERRITIN, TIBC, IRON, RETICCTPCT in the last 72 hours. Urine analysis:    Component Value Date/Time   COLORURINE YELLOW 10/14/2017 [address] 10/14/2017 1615   APPEARANCEUR Cloudy 07/07/2014 1349   LABSPEC 1.019 10/14/2017 1615   LABSPEC 1.016 07/07/2014 1349   PHURINE 5.0 10/14/2017 1615   GLUCOSEU NEGATIVE 10/14/2017 1615   GLUCOSEU 50 mg/dL 07/07/2014 1349   HGBUR NEGATIVE 10/14/2017 1615   BILIRUBINUR NEGATIVE 10/14/2017 1615   BILIRUBINUR Negative 07/07/2014 1349   KETONESUR 5 (A) 10/14/2017 1615   PROTEINUR 30 (A) 10/14/2017 1615   UROBILINOGEN 1.0 03/29/2015 1957   NITRITE NEGATIVE 10/14/2017 1615   LEUKOCYTESUR NEGATIVE 10/14/2017 1615   LEUKOCYTESUR 3+ 07/07/2014 1349   Sepsis Labs: @LABRCNTIP (procalcitonin:4,lacticidven:4)  ) Recent Results (from the past 240 hour(s))  Urine Culture     Status: Abnormal   Collection Time: 10/08/17  8:52 AM  Result Value Ref Range Status   Specimen Description   Final    URINE, CATHETERIZED Performed at [facility], [address] [place], [place] [address]    Special Requests   Final    NONE Performed at [facility], [address], [place], [place] [address]    Culture >=100,000 COLONIES/mL ESCHERICHIA COLI (A)  Final   Report Status 10/11/2017 FINAL  Final   Organism ID, Bacteria ESCHERICHIA COLI (A)  Final      Susceptibility   Escherichia  coli - MIC*    AMPICILLIN >=32 RESISTANT Resistant     CEFAZOLIN <=4 SENSITIVE Sensitive     CEFTRIAXONE <=1 SENSITIVE Sensitive     CIPROFLOXACIN <=0.25 SENSITIVE Sensitive     GENTAMICIN <=1 SENSITIVE Sensitive     IMIPENEM <=0.25 SENSITIVE Sensitive     NITROFURANTOIN 64 INTERMEDIATE Intermediate     TRIMETH/SULFA <=20 SENSITIVE Sensitive     AMPICILLIN/SULBACTAM 4 SENSITIVE Sensitive     PIP/TAZO <=4 SENSITIVE Sensitive     Extended ESBL NEGATIVE Sensitive     * >=100,000 COLONIES/mL ESCHERICHIA COLI         Radiology Studies: Ct Head Wo Contrast  Result Date: 10/14/2017 CLINICAL DATA:  Lethargy, unwitnessed fall. EXAM: CT HEAD WITHOUT CONTRAST TECHNIQUE: Contiguous axial images were obtained from the base of the skull through the vertex without intravenous contrast. COMPARISON:  CT scan of March 23, 2016. FINDINGS: Brain: Mild diffuse cortical atrophy is noted. Mild chronic ischemic white matter disease is  noted. No mass effect or midline shift is noted. Ventricular size is within normal limits. There is no evidence of mass lesion, hemorrhage or acute infarction. Vascular: No hyperdense vessel or unexpected calcification. Skull: Normal. Negative for fracture or focal lesion. Sinuses/Orbits: No acute finding. Other: None. IMPRESSION: Mild diffuse cortical atrophy. No acute intracranial abnormality seen. Electronically Signed   By: Marijo Conception, M.D.   On: 10/14/2017 16:04   Dg Chest Port 1 View  Result Date: 10/14/2017 CLINICAL DATA:  Weakness. EXAM: PORTABLE CHEST 1 VIEW COMPARISON:  Radiographs of March 28, 2015. FINDINGS: Stable cardiomediastinal silhouette. Hypoinflation of the lungs is noted. Stable elevated left hemidiaphragm compared to prior exam. Minimal bibasilar subsegmental atelectasis is noted. No pneumothorax or pleural effusion is noted. Bony thorax is unremarkable. IMPRESSION: Hypoinflation of the lungs with minimal bibasilar subsegmental atelectasis. Stable  elevated left hemidiaphragm. Electronically Signed   By: Marijo Conception, M.D.   On: 10/14/2017 15:26        Scheduled Meds: . amLODipine  5 mg Oral Daily  . benztropine  0.5 mg Oral BID  . divalproex  2,000 mg Oral QHS  . enoxaparin (LOVENOX) injection  40 mg Subcutaneous Q24H  . famotidine  20 mg Oral Daily  . insulin aspart  0-9 Units Subcutaneous TID WC  . loratadine  10 mg Oral Daily  . metoprolol succinate  50 mg Oral BID  . QUEtiapine  200 mg Oral QHS  . simvastatin  20 mg Oral Daily   Continuous Infusions: . sodium chloride 75 mL/hr at 10/16/17 0024     LOS: 2 days    Time spent: 25 minutes.     Lelon Frohlich, MD [facility] Pager [phone]  If 7PM-7AM, please contact night-coverage [email or web address] Password [facility] 10/16/2017, 2:34 PM

## 2017-10-16 NOTE — Plan of Care (Signed)
Bed alarm on for fall risk, gait unsteady. Bed in lowest position. Instructed patient to call don't fall.

## 2017-10-16 NOTE — Progress Notes (Signed)
Physical Therapy Treatment Patient Details Name: Scott [name] MRN: [id number] DOB: 12-06-1950 Today's Date: 10/16/2017    History of Present Illness Shonte Soderlund  is a 67 y.o. male, with history of dementia, GERD, dyslipidemia, chronic hyponatremia, schizoaffective disorder, bipolar type, tardive dyskinesia, hypertension was brought to hospital with complaints of altered mental status lethargy, generalized weakness.  Patient had recent UTI and was treated with Keflex.  Patient was found apparently on the floor this morning and was able to get back to bed by himself.    PT Comments    Patient requires constant verbal cueing for completing exercises with fair return, requires less assistance for sitting up at bedside, sit to stands and increased endurance/tolerance for gait training.  Patient limited for gait mostly due to fear of falling demonstrating slow labored shuffle steps with narrow base of support, poor carryover for keeping feet shoulder width apart and tolerated sitting up in chair after therapy - RN aware.  Patient will benefit from continued physical therapy in hospital and recommended venue below to increase strength, balance, endurance for safe ADLs and gait.   Follow Up Recommendations  SNF;Supervision/Assistance - 24 hour     Equipment Recommendations  None recommended by PT    Recommendations for Other Services       Precautions / Restrictions Precautions Precautions: Fall Restrictions Weight Bearing Restrictions: No    Mobility  Bed Mobility Overal bed mobility: Modified Independent Bed Mobility: Supine to Sit           General bed mobility comments: requires less assistance  Transfers Overall transfer level: Needs assistance Equipment used: Rolling walker (2 wheeled) Transfers: Sit to/from [facility] Sit to Stand: Min assist Stand pivot transfers: Min assist       General transfer comment: demonstrates increased BLE strength  for sit to stands  Ambulation/Gait Ambulation/Gait assistance: Min assist Ambulation Distance (Feet): 35 Feet Assistive device: Rolling walker (2 wheeled) Gait Pattern/deviations: Decreased step length - right;Decreased step length - left;Decreased stride length;Shuffle Gait velocity: slow   General Gait Details: demonstrates slow labored cadence with inability to keep feet shoulder width apart, tends to take shuffle like steps with feet touching at midline, limited secondary to fatigue and cadence slows into festinating like pattern once fatiuged   Stairs             Wheelchair Mobility    Modified Rankin (Stroke Patients Only)       Balance Overall balance assessment: Needs assistance Sitting-balance support: Feet supported;No upper extremity supported Sitting balance-Leahy Scale: Good     Standing balance support: Bilateral upper extremity supported;During functional activity Standing balance-Leahy Scale: Fair Standing balance comment: using RW                            Cognition Arousal/Alertness: Awake/alert Behavior During Therapy: WFL for tasks assessed/performed Overall Cognitive Status: History of cognitive impairments - at baseline                                        Exercises General Exercises - Lower Extremity Ankle Circles/Pumps: Seated;AROM;Strengthening;Both;10 reps Long Arc Quad: Seated;AROM;Strengthening;Both;10 reps Hip Flexion/Marching: Seated;AROM;Strengthening;Both;5 reps    General Comments        Pertinent Vitals/Pain Pain Assessment: No/denies pain    Home Living  Prior Function            PT Goals (current goals can now be found in the care plan section) Acute Rehab PT Goals Patient Stated Goal: return home after rehab PT Goal Formulation: With patient Time For Goal Achievement: 10/29/17 Potential to Achieve Goals: Good Progress towards PT goals: Progressing  toward goals    Frequency    Min 3X/week      PT Plan Current plan remains appropriate    Co-evaluation              AM-PAC PT "6 Clicks" Daily Activity  Outcome Measure  Difficulty turning over in bed (including adjusting bedclothes, sheets and blankets)?: None Difficulty moving from lying on back to sitting on the side of the bed? : None Difficulty sitting down on and standing up from a chair with arms (e.g., wheelchair, bedside commode, etc,.)?: A Little Help needed moving to and from a bed to chair (including a wheelchair)?: A Little Help needed walking in hospital room?: A Lot Help needed climbing 3-5 steps with a railing? : A Lot 6 Click Score: 18    End of Session Equipment Utilized During Treatment: Gait belt Activity Tolerance: Patient tolerated treatment well;Patient limited by fatigue Patient left: in chair;with call bell/phone within reach;with chair alarm set Nurse Communication: Mobility status;Other (comment)(RN aware patient left in chair to eat lunch) PT Visit Diagnosis: Unsteadiness on feet (R26.81);Other abnormalities of gait and mobility (R26.89);Muscle weakness (generalized) (M62.81)     Time: 7517-0017 PT Time Calculation (min) (ACUTE ONLY): 22 min  Charges:  $Therapeutic Activity: 8-22 mins                    G Codes:       2:17 PM, 2017-10-27 Lonell Grandchild, MPT Physical Therapist with [facility] [phone] office [phone] mobile phone

## 2017-10-16 NOTE — Care Management Important Message (Signed)
Important Message  Patient Details  Name: Scott [name] MRN: [id number] Date of Birth: January 03, 1951   Medicare Important Message Given:  Yes    Shelda Altes 10/16/2017, 11:41 AM

## 2017-10-17 LAB — GLUCOSE, CAPILLARY
Glucose-Capillary: 87 mg/dL (ref 65–99)
Glucose-Capillary: 231 mg/dL — ABNORMAL HIGH (ref 65–99)

## 2017-10-17 NOTE — Discharge Summary (Signed)
Physician Discharge Summary  [name]:[id number] DOB: September 02, 1950 DOA: 10/14/2017  PCP: Lauretta Grill, NP  Admit date: 10/14/2017 Discharge date: 10/17/2017  Time spent: 45 minutes  Recommendations for Outpatient Follow-up:  -To be discharged to skilled nursing facility once bed and PASA RR number are available.  Discharge Diagnoses:  Active Problems:   Chronic hyponatremia   Schizoaffective disorder, bipolar type ([name])   Diabetes ([place])   Generalized weakness   Discharge Condition: Stable and improved  Filed Weights   10/14/17 1415 10/17/17 0537  Weight: 104.3 kg (230 lb) 102.2 kg (225 lb 5 oz)    History of present illness:  As per Dr. Darrick Meigs on 5/13: Scott [name]  is a 67 y.o. male, with history of dementia, GERD, dyslipidemia, chronic hyponatremia, schizoaffective disorder, bipolar type, tardive dyskinesia, hypertension was brought to hospital with complaints of altered mental status lethargy, generalized weakness.  Patient had recent UTI and was treated with Keflex.  Patient was found apparently on the floor this morning and was able to get back to bed by himself. He is poor historian and unable to provide any significant history. In the ED all the lab work yielded no significant abnormality. UA was clear, chest x-ray shows no infiltrate. Patient admitted for further evaluation for altered mental status/generalized weakness.    Hospital Course:   Generalized weakness, acute metabolic encephalopathy -Appears to be close to baseline, is communicative although speech is sometimes difficult to understand with his tardive dyskinesia. -CT is negative for acute findings, hyponatremia although chronic was a little bit lower than baseline at 125 on presentation and is up to 129 on 5/15. -TSH vitamin B12 are WNL. -Has been evaluated by physical therapy with plans for SNF.  History of seizures -Seizure-free in the hospital, continue home meds  (depakote)  Schizoaffective disorder -Continue Seroquel -Continue Cogentin for tardive dyskinesia.  Hypertension -Blood pressure has been stable, continue metoprolol     Procedures:  None   Consultations:  None  Discharge Instructions  Discharge Instructions    Diet - low sodium heart healthy   Complete by:  As directed    Increase activity slowly   Complete by:  As directed      Allergies as of 10/17/2017   No Known Allergies     Medication List    STOP taking these medications   cephALEXin 500 MG capsule Commonly known as:  KEFLEX     TAKE these medications   acetaminophen 500 MG tablet Commonly known as:  TYLENOL Take 500 mg by mouth every 6 (six) hours as needed.   amLODipine 5 MG tablet Commonly known as:  NORVASC Take 5 mg by mouth daily.   BAZA PROTECT EX Apply 1 application topically daily as needed.   benztropine 0.5 MG tablet Commonly known as:  COGENTIN Take 0.5 mg by mouth 2 (two) times daily.   cetirizine 10 MG tablet Commonly known as:  ZYRTEC Take 10 mg by mouth daily.   divalproex 500 MG DR tablet Commonly known as:  DEPAKOTE Take 2,000 mg by mouth at bedtime.   docusate sodium 100 MG capsule Commonly known as:  COLACE Take 100 mg by mouth 2 (two) times daily.   haloperidol decanoate 50 MG/ML injection Commonly known as:  HALDOL DECANOATE Inject 100 mg into the muscle every 28 (twenty-eight) days.   metFORMIN 850 MG tablet Commonly known as:  GLUCOPHAGE Take 1 tablet (850 mg total) by mouth 2 (two) times daily with a meal.  metoprolol succinate 50 MG 24 hr tablet Commonly known as:  TOPROL-XL Take 50 mg by mouth 2 (two) times daily. Take with or immediately following a meal.   QUEtiapine 200 MG tablet Commonly known as:  SEROQUEL Take 200 mg by mouth at bedtime.   QUEtiapine 50 MG tablet Commonly known as:  SEROQUEL Take 50 mg by mouth 2 (two) times daily. Care giver states he also takes Seroquel 200mg  at  bedtime   ranitidine 75 MG tablet Commonly known as:  ZANTAC Take 75 mg by mouth 2 (two) times daily.   simvastatin 20 MG tablet Commonly known as:  ZOCOR Take 20 mg by mouth daily.   tamsulosin 0.4 MG Caps capsule Commonly known as:  FLOMAX Take 0.4 mg by mouth daily after breakfast.   Vitamin D (Ergocalciferol) 50000 units Caps capsule Commonly known as:  DRISDOL Take 50,000 Units by mouth every 7 (seven) days.      No Known Allergies Contact information for after-discharge care    [facility] .   Service:  Skilled Nursing Contact information: [address] [place] [phone]               The results of significant diagnostics from this hospitalization (including imaging, microbiology, ancillary and laboratory) are listed below for reference.    Significant Diagnostic Studies: Ct Head Wo Contrast  Result Date: 10/14/2017 CLINICAL DATA:  Lethargy, unwitnessed fall. EXAM: CT HEAD WITHOUT CONTRAST TECHNIQUE: Contiguous axial images were obtained from the base of the skull through the vertex without intravenous contrast. COMPARISON:  CT scan of March 23, 2016. FINDINGS: Brain: Mild diffuse cortical atrophy is noted. Mild chronic ischemic white matter disease is noted. No mass effect or midline shift is noted. Ventricular size is within normal limits. There is no evidence of mass lesion, hemorrhage or acute infarction. Vascular: No hyperdense vessel or unexpected calcification. Skull: Normal. Negative for fracture or focal lesion. Sinuses/Orbits: No acute finding. Other: None. IMPRESSION: Mild diffuse cortical atrophy. No acute intracranial abnormality seen. Electronically Signed   By: Marijo Conception, M.D.   On: 10/14/2017 16:04   Dg Chest Port 1 View  Result Date: 10/14/2017 CLINICAL DATA:  Weakness. EXAM: PORTABLE CHEST 1 VIEW COMPARISON:  Radiographs of March 28, 2015. FINDINGS: Stable cardiomediastinal  silhouette. Hypoinflation of the lungs is noted. Stable elevated left hemidiaphragm compared to prior exam. Minimal bibasilar subsegmental atelectasis is noted. No pneumothorax or pleural effusion is noted. Bony thorax is unremarkable. IMPRESSION: Hypoinflation of the lungs with minimal bibasilar subsegmental atelectasis. Stable elevated left hemidiaphragm. Electronically Signed   By: Marijo Conception, M.D.   On: 10/14/2017 15:26    Microbiology: Recent Results (from the past 240 hour(s))  Urine Culture     Status: Abnormal   Collection Time: 10/08/17  8:52 AM  Result Value Ref Range Status   Specimen Description   Final    URINE, CATHETERIZED Performed at [facility], [address] [place], [place] [address]    Special Requests   Final    NONE Performed at [facility], [address], [place],  [address]    Culture >=100,000 COLONIES/mL ESCHERICHIA COLI (A)  Final   Report Status 10/11/2017 FINAL  Final   Organism ID, Bacteria ESCHERICHIA COLI (A)  Final      Susceptibility   Escherichia coli - MIC*    AMPICILLIN >=32 RESISTANT Resistant     CEFAZOLIN <=4 SENSITIVE Sensitive  CEFTRIAXONE <=1 SENSITIVE Sensitive     CIPROFLOXACIN <=0.25 SENSITIVE Sensitive     GENTAMICIN <=1 SENSITIVE Sensitive     IMIPENEM <=0.25 SENSITIVE Sensitive     NITROFURANTOIN 64 INTERMEDIATE Intermediate     TRIMETH/SULFA <=20 SENSITIVE Sensitive     AMPICILLIN/SULBACTAM 4 SENSITIVE Sensitive     PIP/TAZO <=4 SENSITIVE Sensitive     Extended ESBL NEGATIVE Sensitive     * >=100,000 COLONIES/mL ESCHERICHIA COLI     Labs: Basic Metabolic Panel: Recent Labs  Lab 10/14/17 1454 10/15/17 0603 10/15/17 1359 10/16/17 0539  NA 125* 129* 131* 129*  K 4.5 4.1 4.2 4.8  CL 95* 98* 98* 100*  CO2 21* 24 23 26   GLUCOSE 159* 127* 112* 80  BUN 23* 17 18 15   CREATININE 0.97 0.78 0.86 0.73  CALCIUM 8.8* 8.4* 8.9 8.3*   Liver Function Tests: Recent Labs  Lab 10/14/17 1454 10/15/17 0603  AST 20  16  ALT 13* 14*  ALKPHOS 43 44  BILITOT 0.6 0.5  PROT 6.7 6.1*  ALBUMIN 3.0* 2.6*   No results for input(s): LIPASE, AMYLASE in the last 168 hours. No results for input(s): AMMONIA in the last 168 hours. CBC: Recent Labs  Lab 10/14/17 1454 10/15/17 0603 10/16/17 0539  WBC 5.5 4.3 5.8  NEUTROABS 3.5  --   --   HGB 10.6* 10.6* 10.2*  HCT 31.9* 32.4* 31.5*  MCV 87.9 88.0 88.5  PLT 213 214 198   Cardiac Enzymes: Recent Labs  Lab 10/14/17 1454  CKTOTAL 83  TROPONINI <0.03   BNP: BNP (last 3 results) No results for input(s): BNP in the last 8760 hours.  ProBNP (last 3 results) No results for input(s): PROBNP in the last 8760 hours.  CBG: Recent Labs  Lab 10/16/17 0818 10/16/17 1119 10/16/17 1632 10/16/17 2128 10/17/17 0747  GLUCAP 72 142* 143* 158* 87       Signed:  Lelon Frohlich  [facility] Pager: [phone] 10/17/2017, 11:05 AM

## 2017-10-17 NOTE — Clinical Social Work Note (Signed)
PASARR Number received ([id number] C 10-17-17).     Myha Arizpe, Clydene Pugh, LCSW

## 2017-10-17 NOTE — Progress Notes (Signed)
Patient discharged with all personal belongings. IV removed and site intact. Patient transported by his Education officer, museum to [employer].

## 2017-10-17 NOTE — Clinical Social Work Placement (Signed)
   CLINICAL SOCIAL WORK PLACEMENT  NOTE  Date:  10/17/2017  Patient Details  Name: Scott [name] MRN: [id number] Date of Birth: [birth year]  Clinical Social Work is seeking post-discharge placement for this patient at the [place] level of care (*CSW will initial, date and re-position this form in  chart as items are completed):  Yes   Patient/family provided with [facility]'s list of facilities offering this level of care within the geographic area requested by the patient (or if unable, by the patient's family).  Yes   Patient/family informed of their freedom to choose among providers that offer the needed level of care, that participate in Medicare, Medicaid or managed care program needed by the patient, have an available bed and are willing to accept the patient.  Yes   Patient/family informed of [facility]'s ownership interest in [facility] and [facility], as well as of the fact that they are under no obligation to receive care at these facilities.  PASRR submitted to EDS on 10/15/17     PASRR number received on 10/17/17     Existing PASRR number confirmed on       FL2 transmitted to all facilities in geographic area requested by pt/family on 10/15/17     FL2 transmitted to all facilities within larger geographic area on       Patient informed that his/her managed care company has contracts with or will negotiate with certain facilities, including the following:        Yes   Patient/family informed of bed offers received.  Patient chooses bed at [facility]     Physician recommends and patient chooses bed at      Patient to be transferred to [facility] on 10/17/17.  Patient to be transferred to facility by [name], Social Worker, [place]     Patient family notified on 10/17/17 of transfer.  Name of family member notified:  [name], [place] notified and discharge clinicals sent. LCSW signing off.   _______________________________________________ Ihor Gully, LCSW 10/17/2017, 3:25 PM

## 2017-10-17 NOTE — Clinical Social Work Note (Signed)
Patient will be going to [facility] upon receipt of Level II PASARR.     Willman Cuny, Clydene Pugh, LCSW

## 2017-10-17 NOTE — Progress Notes (Signed)
Physical Therapy Treatment Patient Details Name: Scott [name] MRN: [id number] DOB: [birth year] Today's Date: 10/17/2017    History of Present Illness Scott [name]  is a 67 y.o. male, with history of dementia, GERD, dyslipidemia, chronic hyponatremia, schizoaffective disorder, bipolar type, tardive dyskinesia, hypertension was brought to hospital with complaints of altered mental status lethargy, generalized weakness.  Patient had recent UTI and was treated with Keflex.  Patient was found apparently on the floor this morning and was able to get back to bed by himself.    PT Comments    Pt supine in bed and willing to participate with therapy today.  Pt required multimodal mod to max verbal cueing to assist with bed mobility and max cueing for handplacement with sit to stand for assistance and safety.  Pt stated he needed to use restroom multiple times today, ambulated from bed to restroom 3 times though unable to void.  Pt left in chair with chair alarm set, call bell within reach and RN aware of status.     Follow Up Recommendations  SNF;Supervision/Assistance - 24 hour     Equipment Recommendations  None recommended by PT    Recommendations for Other Services       Precautions / Restrictions Precautions Precautions: Fall Restrictions Weight Bearing Restrictions: No    Mobility  Bed Mobility Overal bed mobility: Modified Independent Bed Mobility: Supine to Sit     Supine to sit: Min guard     General bed mobility comments: required cueing for mechanics, able to complete with min guard and cueing  Transfers Overall transfer level: Needs assistance Equipment used: Rolling walker (2 wheeled) Transfers: Sit to/from Stand Sit to Stand: Min assist         General transfer comment: mod cueing for hand placement, min A with STS  Ambulation/Gait Ambulation/Gait assistance: Min assist Ambulation Distance (Feet): 20 Feet Assistive device: Rolling walker (2  wheeled) Gait Pattern/deviations: Decreased step length - right;Decreased step length - left;Decreased stride length;Shuffle Gait velocity: slow   General Gait Details: demonstrates slow labored cadence with inability to keep feet shoulder width apart, tends to take shuffle like steps with feet touching at midline, limited secondary to fatigue and cadence slows into festinating like pattern once fatiuged   Stairs             Wheelchair Mobility    Modified Rankin (Stroke Patients Only)       Balance                                            Cognition Arousal/Alertness: Awake/alert Behavior During Therapy: WFL for tasks assessed/performed Overall Cognitive Status: History of cognitive impairments - at baseline                                        Exercises      General Comments        Pertinent Vitals/Pain Pain Assessment: No/denies pain    Home Living                      Prior Function            PT Goals (current goals can now be found in the care plan section)      Frequency  Min 3X/week      PT Plan Current plan remains appropriate    Co-evaluation              AM-PAC PT "6 Clicks" Daily Activity  Outcome Measure  Difficulty turning over in bed (including adjusting bedclothes, sheets and blankets)?: None Difficulty moving from lying on back to sitting on the side of the bed? : None Difficulty sitting down on and standing up from a chair with arms (e.g., wheelchair, bedside commode, etc,.)?: A Little Help needed moving to and from a bed to chair (including a wheelchair)?: A Little Help needed walking in hospital room?: A Lot Help needed climbing 3-5 steps with a railing? : A Lot 6 Click Score: 18    End of Session Equipment Utilized During Treatment: Gait belt Activity Tolerance: Patient tolerated treatment well;Patient limited by fatigue Patient left: in chair;with call bell/phone  within reach;with chair alarm set Nurse Communication: Mobility status;Other (comment)(RN aware of status, chair alarm set) PT Visit Diagnosis: Unsteadiness on feet (R26.81);Other abnormalities of gait and mobility (R26.89);Muscle weakness (generalized) (M62.81)     Time: 8119-1478 PT Time Calculation (min) (ACUTE ONLY): 35 min  Charges:  $Therapeutic Activity: 23-37 mins                    G Codes:       Ihor Austin, LPTA; CBIS [phone]  Aldona Lento 10/17/2017, 11:52 AM

## 2017-10-17 NOTE — Progress Notes (Signed)
Patient discharging to [facility]. Called report to [facility] nurse. Awaiting his social worker to come pick him up and take him to the [facility].

## 2018-08-20 ENCOUNTER — Other Ambulatory Visit: Payer: Self-pay

## 2018-08-20 ENCOUNTER — Encounter: Payer: Self-pay | Admitting: Behavioral Health

## 2018-08-20 ENCOUNTER — Emergency Department
Admission: EM | Admit: 2018-08-20 | Discharge: 2018-08-24 | Disposition: A | Discharge status: Skilled Nursing Facility | Payer: Medicare Other | Attending: Emergency Medicine | Admitting: Emergency Medicine

## 2018-08-20 DIAGNOSIS — Z79899 Other long term (current) drug therapy: Secondary | ICD-10-CM | POA: Insufficient documentation

## 2018-08-20 DIAGNOSIS — R4689 Other symptoms and signs involving appearance and behavior: Secondary | ICD-10-CM

## 2018-08-20 DIAGNOSIS — E119 Type 2 diabetes mellitus without complications: Secondary | ICD-10-CM | POA: Diagnosis not present

## 2018-08-20 DIAGNOSIS — F209 Schizophrenia, unspecified: Secondary | ICD-10-CM | POA: Diagnosis not present

## 2018-08-20 DIAGNOSIS — I10 Essential (primary) hypertension: Secondary | ICD-10-CM | POA: Insufficient documentation

## 2018-08-20 DIAGNOSIS — Z7984 Long term (current) use of oral hypoglycemic drugs: Secondary | ICD-10-CM | POA: Diagnosis not present

## 2018-08-20 DIAGNOSIS — F17210 Nicotine dependence, cigarettes, uncomplicated: Secondary | ICD-10-CM | POA: Diagnosis not present

## 2018-08-20 DIAGNOSIS — F0391 Unspecified dementia with behavioral disturbance: Secondary | ICD-10-CM

## 2018-08-20 DIAGNOSIS — F03918 Unspecified dementia, unspecified severity, with other behavioral disturbance: Secondary | ICD-10-CM

## 2018-08-20 DIAGNOSIS — F29 Unspecified psychosis not due to a substance or known physiological condition: Secondary | ICD-10-CM

## 2018-08-20 DIAGNOSIS — F410 Panic disorder [episodic paroxysmal anxiety] without agoraphobia: Secondary | ICD-10-CM | POA: Diagnosis present

## 2018-08-20 HISTORY — DX: Other symptoms and signs involving appearance and behavior: R46.89

## 2018-08-20 LAB — CBC
HCT: 26.8 % — ABNORMAL LOW (ref 39.0–52.0)
Hemoglobin: 8.1 g/dL — ABNORMAL LOW (ref 13.0–17.0)
MCH: 23.3 pg — ABNORMAL LOW (ref 26.0–34.0)
MCHC: 30.2 g/dL (ref 30.0–36.0)
MCV: 77 fL — AB (ref 80.0–100.0)
NRBC: 0 % (ref 0.0–0.2)
PLATELETS: 465 10*3/uL — AB (ref 150–400)
RBC: 3.48 MIL/uL — AB (ref 4.22–5.81)
RDW: 22.9 % — ABNORMAL HIGH (ref 11.5–15.5)
WBC: 7.2 10*3/uL (ref 4.0–10.5)

## 2018-08-20 LAB — COMPREHENSIVE METABOLIC PANEL
ALT: 11 U/L (ref 0–44)
AST: 16 U/L (ref 15–41)
Albumin: 3 g/dL — ABNORMAL LOW (ref 3.5–5.0)
Alkaline Phosphatase: 57 U/L (ref 38–126)
Anion gap: 8 (ref 5–15)
BUN: 16 mg/dL (ref 8–23)
CHLORIDE: 103 mmol/L (ref 98–111)
CO2: 24 mmol/L (ref 22–32)
CREATININE: 0.67 mg/dL (ref 0.61–1.24)
Calcium: 8.8 mg/dL — ABNORMAL LOW (ref 8.9–10.3)
GFR calc non Af Amer: >60 mL/min (ref >60)
Glucose, Bld: 68 mg/dL — ABNORMAL LOW (ref 70–99)
Potassium: 4.3 mmol/L (ref 3.5–5.1)
Sodium: 135 mmol/L (ref 135–145)
Total Bilirubin: 0.3 mg/dL (ref 0.3–1.2)
Total Protein: 6 g/dL — ABNORMAL LOW (ref 6.5–8.1)

## 2018-08-20 LAB — SALICYLATE LEVEL

## 2018-08-20 LAB — ACETAMINOPHEN LEVEL: Acetaminophen (Tylenol), Serum: <10 ug/mL — ABNORMAL LOW (ref 10–30)

## 2018-08-20 LAB — ETHANOL: Alcohol, Ethyl (B): <10 mg/dL (ref <10)

## 2018-08-20 MED ORDER — LORAZEPAM 0.5 MG PO TABS
0.5000 mg | ORAL_TABLET | Freq: Two times a day (BID) | ORAL | Status: DC
  Administered 2018-08-20 – 2018-08-24 (×8): 0.5 mg via ORAL
  Filled 2018-08-20 (×8): qty 1

## 2018-08-20 MED ORDER — TAMSULOSIN HCL 0.4 MG PO CAPS
0.4000 mg | ORAL_CAPSULE | Freq: Every day | ORAL | Status: DC
  Administered 2018-08-21 – 2018-08-24 (×4): 0.4 mg via ORAL
  Filled 2018-08-20 (×4): qty 1

## 2018-08-20 MED ORDER — FAMOTIDINE 20 MG PO TABS
20.0000 mg | ORAL_TABLET | Freq: Every day | ORAL | Status: DC
  Administered 2018-08-20 – 2018-08-24 (×5): 20 mg via ORAL
  Filled 2018-08-20 (×5): qty 1

## 2018-08-20 MED ORDER — BENZTROPINE MESYLATE 1 MG PO TABS
0.5000 mg | ORAL_TABLET | Freq: Two times a day (BID) | ORAL | Status: DC
  Administered 2018-08-20 – 2018-08-24 (×8): 0.5 mg via ORAL
  Filled 2018-08-20 (×8): qty 1

## 2018-08-20 MED ORDER — METFORMIN HCL 500 MG PO TABS
1000.0000 mg | ORAL_TABLET | Freq: Two times a day (BID) | ORAL | Status: DC
  Administered 2018-08-20 – 2018-08-24 (×8): 1000 mg via ORAL
  Filled 2018-08-20 (×8): qty 2

## 2018-08-20 MED ORDER — TRAZODONE HCL 50 MG PO TABS
50.0000 mg | ORAL_TABLET | Freq: Every day | ORAL | Status: DC
  Administered 2018-08-20 – 2018-08-23 (×4): 50 mg via ORAL
  Filled 2018-08-20 (×4): qty 1

## 2018-08-20 MED ORDER — QUETIAPINE FUMARATE 200 MG PO TABS
200.0000 mg | ORAL_TABLET | Freq: Two times a day (BID) | ORAL | Status: DC
  Administered 2018-08-20 – 2018-08-24 (×8): 200 mg via ORAL
  Filled 2018-08-20 (×8): qty 1

## 2018-08-20 MED ORDER — DIVALPROEX SODIUM 500 MG PO DR TAB
500.0000 mg | DELAYED_RELEASE_TABLET | Freq: Every day | ORAL | Status: DC
  Administered 2018-08-20 – 2018-08-23 (×4): 500 mg via ORAL
  Filled 2018-08-20 (×4): qty 1

## 2018-08-20 NOTE — ED Provider Notes (Signed)
[facility] Emergency Department Provider Note  Time seen: 3:29 PM  I have reviewed the triage vital signs and the nursing notes.   HISTORY  Chief Complaint Panic Attack    HPI Scott [name] is a 68 y.o. male with a past medical history of schizophrenia, hyperlipidemia, gastric reflux, presents to the emergency department under IVC from the [facility] noted to be combative at times, defecating and drawers, standing naked over male residents, having trouble managing the patient at the facility.  Patient has no complaints at this time.  Is calm and cooperative.  States he does not want to go back to that facility.  Negative review of systems.    Past Medical History:  Diagnosis Date  . Anxiety   . Chronic hyponatremia 05/2013, 11/2013  . Dementia   . Dyslipidemia   . GERD (gastroesophageal reflux disease)   . Hypertension   . Hypoglycemia   . Schizophrenia ([place])   . Seizures ([place])   . Syncope   . Tardive dyskinesia   . Urinary tract infection   . Vitamin D deficiency     Patient Active Problem List   Diagnosis Date Noted  . Generalized weakness 10/14/2017  . Schizoaffective disorder, bipolar type ([place]) 02/07/2016  . Diabetes ([place]) 02/07/2016  . Schizophrenia ([place]) 01/24/2016  . GERD (gastroesophageal reflux disease) 01/24/2016  . Tardive dyskinesia 01/24/2016  . Chronic hyponatremia 01/24/2016  . Hypertension 01/24/2016    Past Surgical History:  Procedure Laterality Date  . CATARACT EXTRACTION W/PHACO Left 01/10/2016   Procedure: CATARACT EXTRACTION PHACO AND INTRAOCULAR LENS PLACEMENT (IOC);  Surgeon: Birder Robson, MD;  Location: [facility] ORS;  Service: Ophthalmology;  Laterality: Left;  [place] 00:53 AP% 24.2 CDE 12.88 fluid pack lot # [id number]  . EYE SURGERY    . FOOT SURGERY      Prior to Admission medications   Medication Sig Start Date End Date Taking? Authorizing Provider  acetaminophen (TYLENOL) 500 MG tablet Take 500 mg by  mouth every 6 (six) hours as needed.    [provider]  amLODipine (NORVASC) 5 MG tablet Take 5 mg by mouth daily.     [provider]  benztropine (COGENTIN) 0.5 MG tablet Take 0.5 mg by mouth 2 (two) times daily.    [provider]  cetirizine (ZYRTEC) 10 MG tablet Take 10 mg by mouth daily.    [provider]  divalproex (DEPAKOTE) 500 MG DR tablet Take 2,000 mg by mouth at bedtime.    [provider]  docusate sodium (COLACE) 100 MG capsule Take 100 mg by mouth 2 (two) times daily.    [provider]  haloperidol decanoate (HALDOL DECANOATE) 50 MG/ML injection Inject 100 mg into the muscle every 28 (twenty-eight) days.    [provider]  metFORMIN (GLUCOPHAGE) 850 MG tablet Take 1 tablet (850 mg total) by mouth 2 (two) times daily with a meal. 06/08/16   Daymon Larsen, MD  metoprolol succinate (TOPROL-XL) 50 MG 24 hr tablet Take 50 mg by mouth 2 (two) times daily. Take with or immediately following a meal.     [provider]  QUEtiapine (SEROQUEL) 200 MG tablet Take 200 mg by mouth at bedtime.    [provider]  QUEtiapine (SEROQUEL) 50 MG tablet Take 50 mg by mouth 2 (two) times daily. Care giver states he also takes Seroquel 200mg  at bedtime    [provider]  ranitidine (ZANTAC) 75 MG tablet Take 75 mg by  mouth 2 (two) times daily.    [provider]  simvastatin (ZOCOR) 20 MG tablet Take 20 mg by mouth daily.    [provider]  Skin Protectants, Misc. (BAZA PROTECT EX) Apply 1 application topically daily as needed.    [provider]  tamsulosin (FLOMAX) 0.4 MG CAPS capsule Take 0.4 mg by mouth daily after breakfast.    [provider]  Vitamin D, Ergocalciferol, (DRISDOL) 50000 units CAPS capsule Take 50,000 Units by mouth every 7 (seven) days.    [provider]    No Known Allergies  No family history on file.  Social History Social History    Tobacco Use  . Smoking status: Current Every Day Smoker    Packs/day: 0.50    Types: Cigarettes  . Smokeless tobacco: Never Used  Substance Use Topics  . Alcohol use: No  . Drug use: No    Review of Systems Constitutional: Negative for fever. Cardiovascular: Negative for chest pain. Respiratory: Negative for shortness of breath. Gastrointestinal: Negative for abdominal pain, vomiting Musculoskeletal: Negative for musculoskeletal complaints Skin: Negative for skin complaints  Neurological: Negative for headache All other ROS negative  ____________________________________________   PHYSICAL EXAM:  VITAL SIGNS: ED Triage Vitals  Enc Vitals Group     BP 08/20/18 1449 120/74     Pulse Rate 08/20/18 1449 65     Resp 08/20/18 1449 16     Temp 08/20/18 1449 97.7 F (36.5 C)     Temp Source 08/20/18 1449 Oral     SpO2 08/20/18 1449 91 %     Weight 08/20/18 1459 220 lb (99.8 kg)     Height 08/20/18 1459 6\' 2"  (1.88 m)     Head Circumference --      Peak Flow --      Pain Score --      Pain Loc --      Pain Edu? --      Excl. in [place]? --    Constitutional: Patient is awake and alert, no acute distress. Eyes: Normal exam ENT   Head: Normocephalic and atraumatic.   Mouth/Throat: Mucous membranes are moist. Cardiovascular: Normal rate, regular rhythm.  Respiratory: Normal respiratory effort without tachypnea nor retractions. Breath sounds are clear  Gastrointestinal: Soft and nontender. No distention.  Musculoskeletal: Nontender with normal range of motion in all extremities.  Mild lower extremity edema bilaterally. Neurologic:  Normal speech and language. No gross focal neurologic deficits Skin:  Skin is warm, dry and intact.  Psychiatric: Mood and affect are normal. Speech and behavior are normal.   ____________________________________________   INITIAL IMPRESSION / ASSESSMENT AND PLAN / ED COURSE  Pertinent labs & imaging results that were available during  my care of the patient were reviewed by me and considered in my medical decision making (see chart for details).  Patient presents emergency department from the [facility] under IVC for combative and irrational behaviors.  According to the IVC patient had been combative, had been defecating and urinating in his room and drawers.  Have been standing naked over male residents.  Currently patient is calm, cooperative, no distress.  He has no complaints.  Patient's lab work is largely [place].  Patient is anemic however significantly improved from prior CBC 06/17/2018.  Psychiatric disposition pending.  ____________________________________________   FINAL CLINICAL IMPRESSION(S) / ED DIAGNOSES  Psychiatric evaluation Combative/agitation   Harvest Dark, MD 08/20/18 2340

## 2018-08-20 NOTE — ED Notes (Signed)
Pt. Resting in hallway bed.  Pt. Does not endorse SI/HI thoughts.  Pt. Requesting to go back to [facility].  Pt. Has strong body odor.  Arrangements for pt. To take shower and change of clothes.

## 2018-08-20 NOTE — Consult Note (Signed)
[facility] Consult   Reason for Consult:  Aggressive Behavior Referring Physician:Dr. Paduchwski   Patient Identification: TRAFTON [name] MRN:  [id number] Principal Diagnosis: <principal problem not specified> Diagnosis:  Active Problems:   Aggression aggravated   Total Time spent with patient: 1 hour  Subjective: "I am hungry, I want to eat."  Scott [name] is a 68 y.o. male patient presented to [facility] ED arrived today via [facility] from the [facility]. The patient was seen face-to-face by this provider; chart reviewed and consulted with Dr. Milus Height  on 08/20/2018 due to the care of the patient. It was discussed with Dr.  Milus Height  that the patient does meet criteria to be observed on the unit. And getting an urinalysis on the patient.When the results are back and he is negative for UTIs. TTS can begin the process of locating a place for him to be admitted to an inpatient unit or communicate with the [facility] in having the patient return to their facility.  Per TTS Counselor Mr. Tammi Klippel, Per the facility, the patient's behaviors have worsened within the last month. He's defecating and urinated on the room floor. When staff stop him, he gets agitated and threaten to hurt them. Staff states these behaviors are new for him and he's been with the facility for approximately a year. He is undressing and walking into others room, during the day and at night.  On evaluation  the patient is alert and oriented x2, calm and cooperative, and mood-congruent with affect.  The patient did make an attempt to participate in his assessment but due to him being incoherent he was unable to share much of what has transpired.  The patient does not appear to be responding to internal or external stimuli. The patient denies any suicidal, homicidal, or self-harm ideations. The patient is not presenting with any psychotic or paranoid behaviors. During an encounter with  the patient, he was able to answer some questions appropriately, along with speaking with garbled speech which it made it difficult for this provider to understand..  Collateral was obtained by [place] Butch Penny Graves-[phone]).  Per [name] she stated that she was not aware of the patient behavior until today.  "As far as I am concerned he has been calm, cooperative, and with no issues".  She states that one  incident the patient had with his behavior and  it was when he had a UTI.  She verbalize "this is all new to me".  She stated that the [facility] did discuss the patient care with her and did voice that he was presenting with some behavior problems but they made changes to his psychiatric medications and he has been doing well.  She related hearing that he has been defecating in the drawer in his room, taking off his clothes and walking the hall naked, and smearing feces all over the place was new to her. Per the [facility] the patient is a safety risk, he will be observed overnight. This provider awaits urinalysis results and plan of care will be determined in the AM.  HPI: None   Past Psychiatric History:    Risk to Self: Suicidal Ideation: No Suicidal Intent: No Is patient at risk for suicide?: No Suicidal Plan?: No Access to Means: No What has been your use of drugs/alcohol within the last 12 months?: Reports of none How many times?: 0 Other Self Harm Risks: Reports of none Triggers for Past Attempts:  None known Intentional Self Injurious Behavior: None Risk to Others: Homicidal Ideation: No Thoughts of Harm to Others: No Current Homicidal Intent: No Current Homicidal Plan: No Access to Homicidal Means: No Identified Victim: Reports of none History of harm to others?: No Assessment of Violence: None Noted Violent Behavior Description: Reports of none Does patient have access to weapons?: No Criminal Charges Pending?: No Does patient have a court  date: No Prior Inpatient Therapy:  Yes Prior Outpatient Therapy:  Yes   Past Medical History:  Past Medical History:  Diagnosis Date  . Aggression aggravated 08/20/2018  . Anxiety   . Chronic hyponatremia 05/2013, 11/2013  . Dementia ([place])   . Dyslipidemia   . GERD (gastroesophageal reflux disease)   . Hypertension   . Hypoglycemia   . Schizophrenia ([place])   . Seizures ([place])   . Syncope   . Tardive dyskinesia   . Urinary tract infection   . Vitamin D deficiency     Past Surgical History:  Procedure Laterality Date  . CATARACT EXTRACTION W/PHACO Left 01/10/2016   Procedure: CATARACT EXTRACTION PHACO AND INTRAOCULAR LENS PLACEMENT (IOC);  Surgeon: Birder Robson, MD;  Location: [facility] ORS;  Service: Ophthalmology;  Laterality: Left;  [place] 00:53 AP% 24.2 CDE 12.88 fluid pack lot # [id number]  . EYE SURGERY    . FOOT SURGERY     Family History: No family history on file. Family Psychiatric  History: None Social History:  Social History   Substance and Sexual Activity  Alcohol Use No     Social History   Substance and Sexual Activity  Drug Use No    Social History   Socioeconomic History  . Marital status: Single    Spouse name: Not on file  . Number of children: Not on file  . Years of education: Not on file  . Highest education level: Not on file  Occupational History  . Not on file  Social Needs  . Financial resource strain: Not on file  . Food insecurity:    Worry: Not on file    Inability: Not on file  . Transportation needs:    Medical: Not on file    Non-medical: Not on file  Tobacco Use  . Smoking status: Current Every Day Smoker    Packs/day: 0.50    Types: Cigarettes  . Smokeless tobacco: Never Used  Substance and Sexual Activity  . Alcohol use: No  . Drug use: No  . Sexual activity: Not Currently  Lifestyle  . Physical activity:    Days per week: Not on file    Minutes per session: Not on file  . Stress: Not on file  Relationships  . Social  connections:    Talks on phone: Not on file    Gets together: Not on file    Attends religious service: Not on file    Active member of club or organization: Not on file    Attends meetings of clubs or organizations: Not on file    Relationship status: Not on file  Other Topics Concern  . Not on file  Social History Narrative  . Not on file   Additional Social History:    Allergies:  No Known Allergies  Labs:  Results for orders placed or performed during the hospital encounter of 08/20/18 (from the past 48 hour(s))  Comprehensive metabolic panel     Status: Abnormal   Collection Time: 08/20/18  3:31 PM  Result Value Ref Range   Sodium 135 135 -  145 mmol/L   Potassium 4.3 3.5 - 5.1 mmol/L   Chloride 103 98 - 111 mmol/L   CO2 24 22 - 32 mmol/L   Glucose, Bld 68 (L) 70 - 99 mg/dL   BUN 16 8 - 23 mg/dL   Creatinine, Ser 0.67 0.61 - 1.24 mg/dL   Calcium 8.8 (L) 8.9 - 10.3 mg/dL   Total Protein 6.0 (L) 6.5 - 8.1 g/dL   Albumin 3.0 (L) 3.5 - 5.0 g/dL   AST 16 15 - 41 U/L   ALT 11 0 - 44 U/L   Alkaline Phosphatase 57 38 - 126 U/L   Total Bilirubin 0.3 0.3 - 1.2 mg/dL   GFR calc non Af Amer >60 >60 mL/min   GFR calc Af Amer >60 >60 mL/min   Anion gap 8 5 - 15    Comment: Performed at [facility], [address] [place], [place] [address]  Ethanol     Status: None   Collection Time: 08/20/18  3:31 PM  Result Value Ref Range   Alcohol, Ethyl (B) <10 <10 mg/dL    Comment: (NOTE) Lowest detectable limit for serum alcohol is 10 mg/dL. For medical purposes only. Performed at [facility], [address] [place], [place] [address]   Salicylate level     Status: None   Collection Time: 08/20/18  3:31 PM  Result Value Ref Range   Salicylate Lvl <2.9 2.8 - 30.0 mg/dL    Comment: Performed at [facility], [address] [place], [place] [address]  Acetaminophen level     Status: Abnormal   Collection Time: 08/20/18  3:31 PM  Result Value Ref  Range   Acetaminophen (Tylenol), Serum <10 (L) 10 - 30 ug/mL    Comment: (NOTE) Therapeutic concentrations vary significantly. A range of 10-30 ug/mL  may be an effective concentration for many patients. However, some  are best treated at concentrations outside of this range. Acetaminophen concentrations >150 ug/mL at 4 hours after ingestion  and >50 ug/mL at 12 hours after ingestion are often associated with  toxic reactions. Performed at [facility], [address] [place], [place] [address]   cbc     Status: Abnormal   Collection Time: 08/20/18  3:31 PM  Result Value Ref Range   WBC 7.2 4.0 - 10.5 K/uL   RBC 3.48 (L) 4.22 - 5.81 MIL/uL   Hemoglobin 8.1 (L) 13.0 - 17.0 g/dL    Comment: Reticulocyte Hemoglobin testing may be clinically indicated, consider ordering this additional test [id number]    HCT 26.8 (L) 39.0 - 52.0 %   MCV 77.0 (L) 80.0 - 100.0 fL   MCH 23.3 (L) 26.0 - 34.0 pg   MCHC 30.2 30.0 - 36.0 g/dL   RDW 22.9 (H) 11.5 - 15.5 %   Platelets 465 (H) 150 - 400 K/uL   nRBC 0.0 0.0 - 0.2 %    Comment: Performed at [facility], [address] [place], [place] [address]    No current facility-administered medications for this encounter.    Current Outpatient Medications  Medication Sig Dispense Refill  . acetaminophen (TYLENOL) 500 MG tablet Take 500 mg by mouth every 6 (six) hours as needed.    Marland Kitchen amLODipine (NORVASC) 5 MG tablet Take 5 mg by mouth daily.     . benztropine (COGENTIN) 0.5 MG tablet Take 0.5 mg by mouth 2 (two) times daily.    . cetirizine (ZYRTEC) 10 MG tablet Take 10 mg by  mouth daily.    . divalproex (DEPAKOTE) 500 MG DR tablet Take 2,000 mg by mouth at bedtime.    . docusate sodium (COLACE) 100 MG capsule Take 100 mg by mouth 2 (two) times daily.    . haloperidol decanoate (HALDOL DECANOATE) 50 MG/ML injection Inject 100 mg into the muscle every 28 (twenty-eight) days.    . metFORMIN (GLUCOPHAGE) 850 MG tablet Take 1 tablet  (850 mg total) by mouth 2 (two) times daily with a meal. 60 tablet 0  . metoprolol succinate (TOPROL-XL) 50 MG 24 hr tablet Take 50 mg by mouth 2 (two) times daily. Take with or immediately following a meal.     . QUEtiapine (SEROQUEL) 200 MG tablet Take 200 mg by mouth at bedtime.    Marland Kitchen QUEtiapine (SEROQUEL) 50 MG tablet Take 50 mg by mouth 2 (two) times daily. Care giver states he also takes Seroquel 200mg  at bedtime    . ranitidine (ZANTAC) 75 MG tablet Take 75 mg by mouth 2 (two) times daily.    . simvastatin (ZOCOR) 20 MG tablet Take 20 mg by mouth daily.    . Skin Protectants, Misc. (BAZA PROTECT EX) Apply 1 application topically daily as needed.    . tamsulosin (FLOMAX) 0.4 MG CAPS capsule Take 0.4 mg by mouth daily after breakfast.    . Vitamin D, Ergocalciferol, (DRISDOL) 50000 units CAPS capsule Take 50,000 Units by mouth every 7 (seven) days.      Musculoskeletal: Strength & Muscle Tone: abnormal Gait & Station: unable to stand Patient leans: Backward  Psychiatric Specialty Exam: Physical Exam  Nursing note and vitals reviewed. Constitutional: He is oriented to person, place, and time. He appears well-developed and well-nourished.  HENT:  Head: Normocephalic and atraumatic.  Right Ear: External ear normal.  Left Ear: External ear normal.  Eyes: Pupils are equal, round, and reactive to light. Conjunctivae and EOM are normal.  Neck: Normal range of motion. Neck supple.  Cardiovascular: Normal rate and regular rhythm.  Respiratory: Effort normal and breath sounds normal.  GI: Soft. Bowel sounds are normal.  Musculoskeletal: Normal range of motion.  Neurological: He is alert and oriented to person, place, and time. He has normal reflexes.  Skin: Skin is warm and dry.    Review of Systems  Constitutional: Negative.   HENT: Negative.   Eyes: Negative.   Respiratory: Negative.   Cardiovascular: Negative.   Gastrointestinal: Negative.   Genitourinary: Negative.    Musculoskeletal: Negative.   Skin: Negative.   Neurological: Negative.   Endo/Heme/Allergies: Negative.   Psychiatric/Behavioral: Positive for hallucinations and memory loss. Negative for depression, substance abuse and suicidal ideas. The patient is nervous/anxious and has insomnia.     Blood pressure 120/74, pulse 65, temperature 97.7 F (36.5 C), temperature source Oral, resp. rate 16, height 6\' 2"  (1.88 m), weight 99.8 kg, SpO2 98 %.Body mass index is 28.25 kg/m.  General Appearance: Disheveled  Eye Contact:  Poor  Speech:  Garbled  Volume:  Normal  Mood:  Anxious  Affect:  Appropriate  Thought Process:  Disorganized  Orientation:  Other:  x2  Thought Content:  Illogical  Suicidal Thoughts:  No  Homicidal Thoughts:  No  Memory:  NA  Judgement:  Impaired  Insight:  Lacking  Psychomotor Activity:  Decreased  Concentration:  Attention Span: Poor  Recall:  Poor  Fund of Knowledge:  Poor  Language:  Poor  Akathisia:  Negative  Handed:  Right  AIMS (if indicated):  Assets:  Housing Social Support  ADL's:  Impaired  Cognition:  Impaired,  Moderate  Sleep:   Good     Treatment Plan Summary: Daily contact with patient to assess and evaluate symptoms and progress in treatment and Medication management  Disposition: No evidence of imminent risk to self or others at present.   Supportive therapy provided about ongoing stressors.  Patient will be observed until his urinalysis results are back and the patient will be reassessed in the [place], NP 08/20/2018 4:47 PM

## 2018-08-20 NOTE — ED Notes (Signed)
BEHAVIORAL HEALTH ROUNDING Patient sleeping: Yes.   Patient alert and oriented: eyes closed  Appears asleep Behavior appropriate: Yes.  ; If no, describe:  Nutrition and fluids offered: Yes  Toileting and hygiene offered: sleeping Sitter present: q 15 minute observations and security monitoring Law enforcement present: yes  ODS 

## 2018-08-20 NOTE — ED Notes (Signed)
IVC PENDING  CONSULT ?

## 2018-08-20 NOTE — ED Notes (Signed)
BEHAVIORAL HEALTH ROUNDING Patient sleeping: No. Patient alert and oriented: yes Behavior appropriate: Yes.  ; If no, describe:  Nutrition and fluids offered: yes Toileting and hygiene offered: Yes  Sitter present: q15 minute observations and security  monitoring Law enforcement present: Yes  ODS  

## 2018-08-20 NOTE — ED Notes (Addendum)
Pt repositioned   - toleting offered

## 2018-08-20 NOTE — ED Notes (Signed)
Pt. Taken to shower room with two techs.  Pt. Is wheelchair bound.  Pt. Is IVC, pt. Will be changed into burgundy scrubs.

## 2018-08-20 NOTE — ED Triage Notes (Signed)
He arrives today via [facility] from the [facility]   Per EMS report  Pt with a panic attack 24-48 hours ago and the staff took out IVC papers at that time   Pt reportedly was throwing feces at staff  - papers served today and pt arrives here via EMS escorted by [employer] - he is calm, cooperative upon arrival   Pt with baseline confusion  - he states  "I am hungry - I ain't eat all day."

## 2018-08-20 NOTE — BH Assessment (Signed)
Assessment Note  Scott [name] is an 68 y.o. male who presents to the ER from the [facility]. Per the facility, the patient's behaviors have worsene within the last month. He's defecating and urinated on the room floor. When staff stop him, he gets agitated and threaten to hurt them. Staff states these behaviors are new for him and he's been with the facility for approximately a year. He is undressing and walking into others room, during the day and at night.  Information for this assessment was provided by the [facility] [phone]). Patient attempted to participate  with the assessment but was incoherent. He was able to share his name and date of birth. Majority of his answers were irrelevant to what was asked. Patient was pleasant and polite towards this Probation officer.  [name] [phone])   Diagnosis: Schizophrenia (per history)  Past Medical History:  Past Medical History:  Diagnosis Date  . Anxiety   . Chronic hyponatremia 05/2013, 11/2013  . Dementia   . Dyslipidemia   . GERD (gastroesophageal reflux disease)   . Hypertension   . Hypoglycemia   . Schizophrenia ([employer])   . Seizures ([place])   . Syncope   . Tardive dyskinesia   . Urinary tract infection   . Vitamin D deficiency     Past Surgical History:  Procedure Laterality Date  . CATARACT EXTRACTION W/PHACO Left 01/10/2016   Procedure: CATARACT EXTRACTION PHACO AND INTRAOCULAR LENS PLACEMENT (IOC);  Surgeon: Birder Robson, MD;  Location: [facility] ORS;  Service: Ophthalmology;  Laterality: Left;  [place] 00:53 AP% 24.2 CDE 12.88 fluid pack lot # [id number]  . EYE SURGERY    . FOOT SURGERY      Family History: No family history on file.  Social History:  reports that he has been smoking cigarettes. He has been smoking about 0.50 packs per day. He has never used smokeless tobacco. He reports that he does not drink alcohol or use drugs.  Additional Social History:   Alcohol / Drug Use Pain Medications: See PTA Prescriptions: See PTA Over the Counter: See PTA History of alcohol / drug use?: No history of alcohol / drug abuse Longest period of sobriety (when/how long): None reported Negative Consequences of Use: (n/a) Withdrawal Symptoms: (n/a)  CIWA: CIWA-Ar BP: 120/74 Pulse Rate: 65 COWS:    Allergies: No Known Allergies  Home Medications: (Not in a hospital admission)   OB/GYN Status:  No LMP for male patient.  General Assessment Data Location of Assessment: [facility] ED TTS Assessment: In system Is this a Tele or Face-to-Face Assessment?: Face-to-Face Is this an Initial Assessment or a Re-assessment for this encounter?: Initial Assessment Patient Accompanied by:: N/A Language Other than English: No Living Arrangements: In Assisted Living/Nursing Home (Comment: Name of [place]) What gender do you identify as?: Male Marital status: Single Pregnancy Status: No Living Arrangements: Other (Comment)([facility]) Can pt return to current living arrangement?: Yes Admission Status: Involuntary Petitioner: Other(Facility ) Is patient capable of signing voluntary admission?: No(Under IVC) Referral Source: Self/Family/Friend Insurance type: Medicare A&B  Medical Screening Exam ([place]) Medical Exam completed: Yes  Crisis Care Plan Living Arrangements: Other (Comment)([facility]) Legal Guardian: Other relative(Sister)  Education Status Is patient currently in school?: No Is the patient employed, unemployed or receiving disability?: Unemployed, Receiving disability income  Risk to self with the past 6 months Suicidal Ideation: No Has patient been a risk to self within the past 6 months prior  to admission? : No Suicidal Intent: No Has patient had any suicidal intent within the past 6 months prior to admission? : No Is patient at risk for suicide?: No Suicidal Plan?: No Has patient  had any suicidal plan within the past 6 months prior to admission? : No Access to Means: No What has been your use of drugs/alcohol within the last 12 months?: Reports of none Previous Attempts/Gestures: No How many times?: 0 Other Self Harm Risks: Reports of none Triggers for Past Attempts: None known Intentional Self Injurious Behavior: None Family Suicide History: No Recent stressful life event(s): Other (Comment) Persecutory voices/beliefs?: No Depression: No Depression Symptoms: Feeling angry/irritable Substance abuse history and/or treatment for substance abuse?: No Suicide prevention information given to non-admitted patients: Not applicable  Risk to Others within the past 6 months Homicidal Ideation: No Does patient have any lifetime risk of violence toward others beyond the six months prior to admission? : No Thoughts of Harm to Others: No Current Homicidal Intent: No Current Homicidal Plan: No Access to Homicidal Means: No Identified Victim: Reports of none History of harm to others?: No Assessment of Violence: None Noted Violent Behavior Description: Reports of none Does patient have access to weapons?: No Criminal Charges Pending?: No Does patient have a court date: No Is patient on probation?: No  Psychosis Hallucinations: None noted Delusions: None noted  Mental Status Report Appearance/Hygiene: Poor hygiene Eye Contact: Poor Motor Activity: Unable to assess Speech: Pressured, Soft, Incoherent Level of Consciousness: Quiet/awake Mood: Pleasant Affect: Appropriate to circumstance Anxiety Level: Minimal Thought Processes: Irrelevant, Flight of Ideas Judgement: Impaired Orientation: Person, Place Obsessive Compulsive Thoughts/Behaviors: None  Cognitive Functioning Concentration: Unable to Assess Memory: Unable to Assess Is patient IDD: No Insight: Unable to Assess Impulse Control: Unable to Assess Appetite: Fair Have you had any weight changes? : No  Change Sleep: Decreased Total Hours of Sleep: 4 Vegetative Symptoms: None                   Abuse/Neglect Assessment (Assessment to be complete while patient is alone) Abuse/Neglect Assessment Can Be Completed: Unable to assess, patient is non-responsive or altered mental status(Patient incoherent) Values / Beliefs Cultural Requests During Hospitalization: None Spiritual Requests During Hospitalization: None Consults Spiritual Care Consult Needed: No Social Work Consult Needed: No Regulatory affairs officer (For Healthcare) Does Patient Have a Medical Advance Directive?: (unknown)       Child/Adolescent Assessment Running Away Risk: Denies(Patient is an adult)  Disposition:     On Site Evaluation by:   Reviewed with Physician:    Gunnar Fusi MS, [place], [place], [place], CCSI Therapeutic Triage Specialist 08/20/2018 4:39 PM

## 2018-08-20 NOTE — ED Notes (Signed)

## 2018-08-21 DIAGNOSIS — F209 Schizophrenia, unspecified: Secondary | ICD-10-CM | POA: Diagnosis not present

## 2018-08-21 NOTE — ED Notes (Signed)
Hourly rounding reveals patient sleeping in room. No complaints, stable, in no acute distress. Q15 minute rounds and monitoring via Security to continue. 

## 2018-08-21 NOTE — ED Notes (Signed)
Dinner and beverage given.

## 2018-08-21 NOTE — ED Notes (Signed)
Hourly rounding reveals patient in room. No complaints, stable, in no acute distress. Q15 minute rounds and monitoring via Security Cameras to continue. 

## 2018-08-21 NOTE — ED Notes (Signed)
Hourly rounding reveals patient in room. No complaints, stable, in no acute distress. Q15 minute rounds and monitoring via Rover and Officer to continue.   

## 2018-08-21 NOTE — ED Notes (Signed)
Pt changed into dry diaper and pants after peri care with this NT and Donneta Romberg, Therapist, sports.

## 2018-08-21 NOTE — ED Notes (Signed)
Report to include Situation, Background, Assessment, and Recommendations received from [facility]. Patient alert and oriented, warm and dry, in no acute distress. Unable to assess SI, HI, AVH and pain. Patient mumbles but can't understand what he saying. Patient made aware of Q15 minute rounds and Engineer, drilling presence for their safety. Patient instructed to come to me with needs or concerns.

## 2018-08-21 NOTE — ED Notes (Signed)
Patient urinated in bed, changed by Probation officer and [facility] ED tech. Changed patients clothing, after wiping him down and changed his linen. Placed patient in a reclining chair

## 2018-08-21 NOTE — ED Notes (Signed)
Pt given turkey sandwich tray 

## 2018-08-21 NOTE — BH Assessment (Cosign Needed)
Per this provider follow-up note; [name] is a 68 y.o. male patient presented to [facility] ED arrived today via [facility] from the [facility]. The patient was seen face-to-face by this provider; chart reviewed and consulted with Dr. Milus Height on 08/20/2018 due to the care of the patient.It was discussed with Dr.  Milus Height  that the patient does meet criteria to be observed on the unit. And getting an urinalysis on the patient.When the results are back and he is negative for UTIs. TTS can begin the process of locating a place for him to be admitted to an inpatient unit or communicate with the [facility] in having the patient return to their facility.  The [facility] as stated that the patient needs to be at [facility] ED, for 3 days before they will come out and assess him.  Then they will decide if he will be accepted back at their facility. Follow-up was done this morning on the patient Mr. Jahari Billy. Rattigan.  It was reported that he slept well no behavior issues during the night.  This provider spoke with Ms. Berenice Primas the patient guardian [phone] on the plan for the patient that he will be faxed out to located a permanent facility for him.  The guardian was in agreement and stated that a few months ago the [facility] have voice that Mr. Saran physically was declining and did not met criteria to remain at their Center.  She continued to voice that she was in agreement and did not believe he would be accepted back. This provider communicated with Dr. Rip Harbour requesting such a social work consult to assist with finding the patient permanent placement. This morning the patient has been calm, corporative with no behavior issue thus far.  The patient will remain on his current medication until he is placed.

## 2018-08-21 NOTE — ED Notes (Signed)
Patient has been yelling out to the nursing staff that he is ready to go, he says "yall are just sitting on your butts" and I need to leave. Writer asked patient whats the matter and he said he was ready to go

## 2018-08-21 NOTE — ED Notes (Signed)
Patient sitting up in chair wiping his hands for lunch,patient is eating at this time.

## 2018-08-21 NOTE — ED Provider Notes (Signed)
-----------------------------------------   8:06 AM on 08/21/2018 -----------------------------------------   Blood pressure (!) 128/93, pulse 92, temperature 98 F (36.7 C), temperature source Tympanic, resp. rate 16, height 6\' 2"  (1.88 m), weight 99.8 kg, SpO2 99 %.  The patient is calm and cooperative at this time.  There have been no acute events since the last update.  Awaiting disposition plan from Behavioral Medicine team.    Nena Polio, MD 08/21/18 [phone]

## 2018-08-22 DIAGNOSIS — F209 Schizophrenia, unspecified: Secondary | ICD-10-CM | POA: Diagnosis not present

## 2018-08-22 NOTE — ED Notes (Signed)
Hourly rounding reveals patient in room. No complaints, stable, in no acute distress. Q15 minute rounds and monitoring via Rover and Officer to continue.   

## 2018-08-22 NOTE — ED Notes (Signed)
Patient dry at this time 

## 2018-08-22 NOTE — ED Notes (Signed)
Patient was given warm washcloth to was hands and face before breakfast.

## 2018-08-22 NOTE — ED Provider Notes (Signed)
-----------------------------------------   7:25 AM on 08/22/2018 -----------------------------------------   Blood pressure (!) 140/94, pulse 88, temperature 98 F (36.7 C), temperature source Oral, resp. rate 18, height 6\' 2"  (1.88 m), weight 99.8 kg, SpO2 100 %.  The patient is calm and cooperative at this time.  There have been no acute events since the last update.  Awaiting disposition plan from Behavioral Medicine team.    Nena Polio, MD 08/22/18 [phone]

## 2018-08-22 NOTE — ED Notes (Signed)
BEHAVIORAL HEALTH ROUNDING Patient sleeping: No. Patient alert and oriented: yes Behavior appropriate: Yes.  ; If no, describe:  Nutrition and fluids offered: yes Toileting and hygiene offered: Yes  Sitter present: q15 minute observations and security  monitoring Law enforcement present: Yes  ODS  

## 2018-08-22 NOTE — ED Notes (Signed)
BEHAVIORAL HEALTH ROUNDING Patient sleeping: No. Patient alert : yes Behavior appropriate: Yes.  ; If no, describe:  Nutrition and fluids offered: yes Toileting and hygiene offered: Yes  Sitter present: q15 minute observations and security monitoring Law enforcement present: Yes  ODS  

## 2018-08-22 NOTE — ED Notes (Addendum)
ED Is the patient under IVC or is there intent for IVC: Yes.   Is the patient medically cleared: Yes.   Is there vacancy in the ED BHU: Yes.   Is the population mix appropriate for patient:   No geriatric   Pt doesn not ambulate well  Is the patient awaiting placement in inpatient or outpatient setting:   Social work consult in progress  They are seeking placement for him   Has the patient had a psychiatric consult: Yes.   Survey of unit performed for contraband, proper placement and condition of furniture, tampering with fixtures in bathroom, shower, and each patient room: Yes.  ; Findings:  APPEARANCE/BEHAVIOR Calm and cooperative NEURO ASSESSMENT Orientation: oriented to self  Denies pain Hallucinations: No.None noted (Hallucinations)  denies Speech: Normal to garbled at times Gait: unsteady  RESPIRATORY ASSESSMENT Even  Unlabored respirations  CARDIOVASCULAR ASSESSMENT Pulses equal   regular rate  Skin warm and dry   GASTROINTESTINAL ASSESSMENT no GI complaint EXTREMITIES Full ROM  PLAN OF CARE Provide calm/safe environment. Vital signs assessed twice daily. ED BHU Assessment once each 12-hour shift. Collaborate with TTS daily or as condition indicates. Assure the ED provider has rounded once each shift. Provide and encourage hygiene. Provide redirection as needed. Assess for escalating behavior; address immediately and inform ED provider.  Assess family dynamic and appropriateness for visitation as needed: Yes.  ; If necessary, describe findings:  Educate the patient/family about BHU procedures/visitation: Yes.  ; If necessary, describe findings:

## 2018-08-22 NOTE — ED Notes (Addendum)
Patient in room watching TV at this time.

## 2018-08-22 NOTE — ED Notes (Signed)
BEHAVIORAL HEALTH ROUNDING Patient sleeping: Yes.   Patient alert and oriented: eyes closed  Appears asleep Behavior appropriate: Yes.  ; If no, describe:  Nutrition and fluids offered: Yes  Toileting and hygiene offered: sleeping Sitter present: q 15 minute observations and security monitoring Law enforcement present: yes  ODS 

## 2018-08-22 NOTE — ED Notes (Signed)
Pt observed lying in bed - overnight staff just changed his undergarments /linens   Assistance provided with repositioning    Pt visualized with NAD  No verbalized needs or concerns at this time  Continue to monitor

## 2018-08-22 NOTE — ED Notes (Signed)
RN Amy & EDT changed pt. Pt is now placed in gown and now has cleaned linen. Pt is resting bed with no complaints at this time.

## 2018-08-22 NOTE — ED Notes (Signed)

## 2018-08-23 DIAGNOSIS — F209 Schizophrenia, unspecified: Secondary | ICD-10-CM | POA: Diagnosis not present

## 2018-08-23 NOTE — TOC Initial Note (Signed)
Transition of Care [facility]) - Initial/Assessment Note    Patient Details  Name: Scott [name] MRN: [id number] Date of Birth: Sep 20, 1950  [facility]) CM/SW Contact:    Berenice Bouton, LCSW Phone Number: 08/23/2018, 4:24 PM  Clinical Narrative: Scott [name] is a 68 y.o. male patient presented to [facility] ED arrived 08/20/2018 via [facility] from the [facility]. Per notes reviewed the patient has been engaged in bizarre behavior for a number of days (see notes)  [name] center staff filed IVC papers and patient was brought in for evaluation.   No SI/HI/SA.  Patient was evaluated by TTS/BHH and [place] Psychiatry Consult completed on 08/20/2018.   Patient ready for discharge however [facility] would like to conduct a safety assessment prior to patient returning to the facility. Due to Covid-19 restriction staff at [place] center unable to do face-to-face assessment. Clinician faxed FL2, attending providers notes, psychiatric assessment to [facility] weekend administrator.    Collateral was obtained by [place] Butch Penny [phone]) and [facility], weekend administrator, [phone].    Plan: Patient ready for discharge.  Waiting for telephone call from [facility] to review documents, new room assignment.    Expected Discharge Plan: Skilled Nursing Facility Barriers to Discharge: Other (comment)(CSW wating for [facility] to review documantation, Psychiatric assessment and notes)   Patient Goals and CMS Choice Patient states their goals for this hospitalization and ongoing recovery are:: Return to [facility].gov Compare Post Acute Care list provided to:: Other (Comment Required)([facility] administrators) Choice offered to / list presented to : NA(Pt returning to [facility])  Expected Discharge Plan and Services Expected Discharge Plan: [place]   Discharge  Planning Services: NA Post Acute Care Choice: [place] Living arrangements for the past 2 months: [place]                          Prior Living Arrangements/Services Living arrangements for the past 2 months: [place] Lives with:: Facility Resident Patient language and need for interpreter reviewed:: No Do you feel safe going back to the place where you live?: Yes      Need for Family Participation in Patient Care: No (Comment) Care giver support system in place?: Yes (comment)([place] Butch Penny Graves-[phone]))   Criminal Activity/Legal Involvement Pertinent to Current Situation/Hospitalization: No - Comment as needed  Activities of Daily Living      Permission Sought/Granted Permission sought to share information with : [place] granted to share information with : Yes, Verbal Permission Granted([place] Butch Penny Graves-[phone]))  Share Information with NAME: [place] granted to share info w AGENCY: [place] granted to share info w Relationship: [place] Butch Penny [phone])  Permission granted to share info w Contact Information: [place] Butch Penny [phone])  Emotional Assessment Appearance:: Appears older than stated age Attitude/Demeanor/Rapport: (Per notes pt calm most of the day. ) Affect (typically observed): Calm Orientation: : Oriented to Self, Oriented to Place, Oriented to  Time, Oriented to Situation Alcohol / Substance Use: Not Applicable Psych Involvement: Yes (comment)  Admission diagnosis:  med eval Patient Active Problem List   Diagnosis Date Noted  . Aggression aggravated 08/20/2018  . Generalized weakness 10/14/2017  . Schizoaffective disorder, bipolar type ([place]) 02/07/2016  . Diabetes ([place]) 02/07/2016  . Schizophrenia ([place])  01/24/2016  . GERD (gastroesophageal reflux disease) 01/24/2016  . Tardive dyskinesia 01/24/2016  . Chronic hyponatremia 01/24/2016  . Hypertension 01/24/2016   PCP:  Lauretta Grill, NP Pharmacy:   [facility], [place] [address] [place] [address] Phone: [phone] Fax: [phone]     Social Determinants of Health (SDOH) Interventions    Readmission Risk Interventions No flowsheet data found.

## 2018-08-23 NOTE — ED Notes (Addendum)
Pt's brief changed and pt repositioned in the bed.

## 2018-08-23 NOTE — ED Notes (Signed)
Pt's bed and brief changed.

## 2018-08-23 NOTE — ED Notes (Signed)
Pt yelling @ this staff member that he wants to get out of bed, RN notified

## 2018-08-23 NOTE — ED Notes (Signed)
Pt's brief was changed.

## 2018-08-23 NOTE — ED Notes (Signed)
Checked patient's brief.  Patient was dry.

## 2018-08-23 NOTE — ED Notes (Signed)
Pt provided w/ 2 orange juices.

## 2018-08-23 NOTE — ED Notes (Signed)
Pt calling out for this staff member wanting something to eat, specifically "breakfast" this tech advised pt that breakfast had not came yet and pt reminded of the time, pt offered a sandwich tray but declined stating he only wanted "chips and orange juice" pt provided with both but declined once I got back in the room, pt bed linen,brief, and gown changed, pt has no further needs at this time

## 2018-08-23 NOTE — TOC Progression Note (Signed)
CSW made telephone call to [facility].  Message left for administrator to call CSW.   Berenice Bouton, MSW, LCSW  [phone]

## 2018-08-23 NOTE — ED Provider Notes (Signed)
-----------------------------------------   6:57 AM on 08/23/2018 -----------------------------------------   Blood pressure (!) 140/94, pulse 88, temperature 98 F (36.7 C), temperature source Oral, resp. rate 18, height 1.88 m (6\' 2" ), weight 99.8 kg, SpO2 100 %.  The patient is calm and cooperative at this time.  There have been no acute events since the last update.  As per the behavioral medicine team evaluation, the patient needs a social work consult and placement assistance.   Hinda Kehr, MD 08/23/18 [phone]

## 2018-08-23 NOTE — ED Notes (Signed)
IVC pending Social Work placement

## 2018-08-23 NOTE — TOC Progression Note (Signed)
Transition of Care [facility]) - Progression Note   Patient Details  Name: ALOYSUIS [name] MRN: [id number] Date of Birth: [birth year]  Transition of Care [facility]) CM/SW [place], LCSW Phone Number: 08/23/2018, 5:28 PM  Clinical Narrative:    Clinician received a telephone call from [name], [facility].  He stated that "we are leaning not to have him back. Staff believes he is dangerous.  He was standing naked in front of our staff."  [name] stated he is waiting for a call back from the patient's DSS guardian, Scott [name]-[phone], before they make a decision.  Undersigned CSW also left a message for Scott [name].  Waiting for return call from [place].    Plan: Waiting for call from [place] worker. Waiting for [facility] to make decision. CSW will continue to reach out to DSS guardian on 08/24/2018.    Expected Discharge Plan: Skilled Nursing Facility Barriers to Discharge: Other (comment)(CSW wating for [facility] to review documantation, Psychiatric assessment and notes)  Expected Discharge Plan and Services Expected Discharge Plan: [place]   Discharge Planning Services: NA Post Acute Care Choice: [place] arrangements for the past 2 months: [place]                           Social Determinants of Health (SDOH) Interventions    Readmission Risk Interventions No flowsheet data found.

## 2018-08-23 NOTE — NC FL2 (Signed)
[place] LEVEL OF CARE SCREENING TOOL     IDENTIFICATION  Patient Name: Scott [name] Birthdate: [birth year] Sex: male Admission Date (Current Location): 08/20/2018  [place] and [place] Number:  Engineering geologist and Address:  [facility], [address], [place], [place] [address]      Provider Number: [id number]  Attending Physician Name and Address:  No att. providers found  Relative Name and Phone Number:       Current Level of Care: Hospital Recommended Level of Care: [place] Prior Approval Number:    Date Approved/Denied:   PASRR Number: [id number]  Discharge Plan: SNF    Current Diagnoses: Patient Active Problem List   Diagnosis Date Noted  . Aggression aggravated 08/20/2018  . Generalized weakness 10/14/2017  . Schizoaffective disorder, bipolar type ([place]) 02/07/2016  . Diabetes ([place]) 02/07/2016  . Schizophrenia ([place]) 01/24/2016  . GERD (gastroesophageal reflux disease) 01/24/2016  . Tardive dyskinesia 01/24/2016  . Chronic hyponatremia 01/24/2016  . Hypertension 01/24/2016    Orientation RESPIRATION BLADDER Height & Weight     Self, Time, Situation, Place  Normal Incontinent Weight: 220 lb (99.8 kg) Height:  6\' 2"  (188 cm)  BEHAVIORAL SYMPTOMS/MOOD NEUROLOGICAL BOWEL NUTRITION STATUS  Other (Comment)(Demanding immediate services. )   Incontinent Diet  AMBULATORY STATUS COMMUNICATION OF NEEDS Skin   Limited Assist Verbally Normal                       Personal Care Assistance Level of Assistance  Bathing, Feeding, Dressing Bathing Assistance: Limited assistance Feeding assistance: Independent Dressing Assistance: Limited assistance     Functional Limitations Info  Sight, Hearing, Speech Sight Info: Adequate Hearing Info: Adequate Speech Info: Adequate    SPECIAL CARE FACTORS FREQUENCY  PT (By licensed PT), OT (By licensed OT)(dementia, hypertention, schitzophrenia)     PT  Frequency: 5x OT Frequency: 3x            Contractures Contractures Info: Not present    Additional Factors Info  Code Status Code Status Info: Full Code             Current Medications (08/23/2018):  This is the current hospital active medication list Current Facility-Administered Medications  Medication Dose Route Frequency Provider Last Rate Last Dose  . benztropine (COGENTIN) tablet 0.5 mg  0.5 mg Oral BID Harvest Dark, MD   0.5 mg at 08/23/18 1005  . divalproex (DEPAKOTE) DR tablet 500 mg  500 mg Oral QHS Harvest Dark, MD   500 mg at 08/22/18 2258  . famotidine (PEPCID) tablet 20 mg  20 mg Oral Daily Harvest Dark, MD   20 mg at 08/23/18 1006  . LORazepam (ATIVAN) tablet 0.5 mg  0.5 mg Oral BID Harvest Dark, MD   0.5 mg at 08/23/18 1006  . metFORMIN (GLUCOPHAGE) tablet 1,000 mg  1,000 mg Oral BID Harvest Dark, MD   1,000 mg at 08/23/18 1006  . QUEtiapine (SEROQUEL) tablet 200 mg  200 mg Oral BID Harvest Dark, MD   200 mg at 08/23/18 1006  . tamsulosin (FLOMAX) capsule 0.4 mg  0.4 mg Oral QPC breakfast Harvest Dark, MD   0.4 mg at 08/23/18 1005  . traZODone (DESYREL) tablet 50 mg  50 mg Oral QHS Harvest Dark, MD   50 mg at 08/22/18 2257   Current Outpatient Medications  Medication Sig Dispense Refill  . benztropine (COGENTIN) 0.5 MG tablet Take 0.5 mg by mouth 2 (two) times daily.    Marland Kitchen  cetirizine (ZYRTEC) 10 MG tablet Take 10 mg by mouth daily.    . divalproex (DEPAKOTE) 500 MG DR tablet Take 500 mg by mouth at bedtime.     . famotidine (PEPCID) 20 MG tablet Take 20 mg by mouth daily.    . haloperidol decanoate (HALDOL DECANOATE) 50 MG/ML injection Inject 100 mg into the muscle every 28 (twenty-eight) days.    . Iron Combinations (NIFEREX) TABS Take 1 tablet by mouth daily.    Marland Kitchen LORazepam (ATIVAN) 0.5 MG tablet Take 0.5 mg by mouth 2 (two) times daily.    . metFORMIN (GLUCOPHAGE) 1000 MG tablet Take 1,000 mg by mouth 2 (two)  times daily.    . QUEtiapine (SEROQUEL) 200 MG tablet Take 200 mg by mouth 2 (two) times daily.     Marland Kitchen senna-docusate (SENOKOT-S) 8.6-50 MG tablet Take 1 tablet by mouth 2 (two) times daily.    . tamsulosin (FLOMAX) 0.4 MG CAPS capsule Take 0.4 mg by mouth daily after breakfast.    . traZODone (DESYREL) 50 MG tablet Take 50 mg by mouth at bedtime.    . Vitamin D, Ergocalciferol, (DRISDOL) 50000 units CAPS capsule Take 50,000 Units by mouth every 7 (seven) days.    Marland Kitchen acetaminophen (TYLENOL) 500 MG tablet Take 500 mg by mouth every 6 (six) hours as needed.    . Polyethylene Glycol 3350 (PEG 3350) POWD Take 17 g by mouth as needed.    Marland Kitchen QUEtiapine (SEROQUEL) 50 MG tablet Take 50 mg by mouth 2 (two) times daily. Care giver states he also takes Seroquel 200mg  at bedtime    . Skin Protectants, Misc. (BAZA PROTECT EX) Apply 1 application topically daily as needed.       Discharge Medications: Please see discharge summary for a list of discharge medications.  Relevant Imaging Results:  Relevant Lab Results:   Additional Information SS: [id number]  Berenice Bouton, LCSW

## 2018-08-23 NOTE — Clinical Social Work Note (Signed)
CSW has been in contact with [facility].  Preparing documents for discharge/return to [facility] [place].  FL2 completed and sent. Administrator requesting that paper work [facility] and attachment documents) be faxed to [phone] and [phone].  Stated that they have no way to pull documents out of the Hub due to weekend staff shortage. Once paper work is faxed administrators will assess and advise CSW of next step.   CSW in the process of faxing documents.   Berenice Bouton, MSW, LCSW  [phone] or CSW ED # [phone]

## 2018-08-24 DIAGNOSIS — F209 Schizophrenia, unspecified: Secondary | ICD-10-CM | POA: Diagnosis not present

## 2018-08-24 NOTE — ED Notes (Signed)
Voicemail left notifying guardian of patient discharge.

## 2018-08-24 NOTE — ED Notes (Signed)
Pt's bed and brief changed.

## 2018-08-24 NOTE — ED Notes (Signed)
Pts brief changed  

## 2018-08-24 NOTE — ED Provider Notes (Signed)
 -----------------------------------------   4:07 PM on 08/24/2018 ----------------------------------------- I was informed by behavioral medicine team that the [facility] is willing to take the patient back.  Since he can return, he does not need psychiatric hospitalization.  He is currently calm.  He has urinary incontinence and has been unable to provide a urine sample, but vital signs and white blood cell count are normal.  Plan for discharge and outpatient follow-up.   Carrie Mew, MD 08/24/18 [phone]

## 2018-08-24 NOTE — ED Notes (Signed)
Voicemail left for patient guardian, informing her of patient's discharge.  Pt discharged back to [facility] via  [place].  Report called to nurse [facility] at nursing facility.

## 2018-08-24 NOTE — TOC Progression Note (Addendum)
Transition of Care [facility]) - Progression Note    Patient Details  Name: Scott [name] MRN: [id number] Date of Birth: September 04, 1950  Transition of Care [facility]) CM/SW [place], LCSW Phone Number:  [phone] 8am-6pm (weekends) or CSW ED # [id number] 08/24/2018, 12:39 PM  Clinical Narrative:    -Received a call back from [place] worker who noted that "they cannot refuse to take him back. They have to find an adequate replacement." Noted that if the facility refuses to take pt back she is going to take the report.  Advised CSW to call back after speaking to [facility].   -Clinician waiting for a call back from [facility] [place].   Plan: CSW will continue to reach out to facility. All advised that pt is medically and psychiatrically cleared and can return to facility.   Expected Discharge Plan: Skilled Nursing Facility Barriers to Discharge: Other (comment)(CSW wating for [facility] to review documantation, Psychiatric assessment and notes)  Expected Discharge Plan and Services Expected Discharge Plan: [place]   Discharge Planning Services: NA Post Acute Care Choice: [place] arrangements for the past 2 months: [place]                           Social Determinants of Health (SDOH) Interventions    Readmission Risk Interventions No flowsheet data found.

## 2018-08-24 NOTE — Clinical Social Work CHF Assess (Signed)
Nurse informed that patient is ready for discharge back to [facility] [place].  Nurse informed that patient needs ambulance ride back to [facility].   CSW signing off.   Berenice Bouton, MSW, LCSW  [phone] 8am-6pm (weekends) or CSW ED # [phone]

## 2018-08-24 NOTE — TOC Progression Note (Signed)
Transition of Care [facility]) - Progression Note    Patient Details  Name: Scott [name] MRN: [id number] Date of Birth: 12-Jul-1950  Transition of Care [facility]) CM/SW [place], LCSW Phone Number: 08/24/2018, 2:58 PM  Clinical Narrative:   Clinician received phone call from [facility] [place] stating that patient can return to [facility]. Pt needs abulance ride back to facility. Clinician will inform ED nurse to proceed with discharge.      Expected Discharge Plan: Skilled Nursing Facility Barriers to Discharge: Other (comment)(CSW wating for [facility] to review documantation, Psychiatric assessment and notes)  Expected Discharge Plan and Services Expected Discharge Plan: [place]   Discharge Planning Services: NA Post Acute Care Choice: [place] arrangements for the past 2 months: [place]                           Social Determinants of Health (SDOH) Interventions    Readmission Risk Interventions No flowsheet data found.

## 2018-08-24 NOTE — ED Notes (Signed)
Pt's brief changed. Pt sitting in recliner.

## 2019-04-05 ENCOUNTER — Other Ambulatory Visit: Payer: Self-pay

## 2019-04-05 ENCOUNTER — Emergency Department
Admission: EM | Admit: 2019-04-05 | Discharge: 2019-04-07 | Disposition: A | Discharge status: Home / Self Care | Payer: Medicare Other | Attending: Emergency Medicine | Admitting: Emergency Medicine

## 2019-04-05 DIAGNOSIS — Z20828 Contact with and (suspected) exposure to other viral communicable diseases: Secondary | ICD-10-CM | POA: Diagnosis not present

## 2019-04-05 DIAGNOSIS — Z7984 Long term (current) use of oral hypoglycemic drugs: Secondary | ICD-10-CM | POA: Diagnosis not present

## 2019-04-05 DIAGNOSIS — Z008 Encounter for other general examination: Secondary | ICD-10-CM

## 2019-04-05 DIAGNOSIS — F17210 Nicotine dependence, cigarettes, uncomplicated: Secondary | ICD-10-CM | POA: Insufficient documentation

## 2019-04-05 DIAGNOSIS — Z79899 Other long term (current) drug therapy: Secondary | ICD-10-CM | POA: Insufficient documentation

## 2019-04-05 DIAGNOSIS — I10 Essential (primary) hypertension: Secondary | ICD-10-CM | POA: Insufficient documentation

## 2019-04-05 DIAGNOSIS — F258 Other schizoaffective disorders: Secondary | ICD-10-CM

## 2019-04-05 DIAGNOSIS — R4689 Other symptoms and signs involving appearance and behavior: Secondary | ICD-10-CM | POA: Diagnosis present

## 2019-04-05 DIAGNOSIS — F209 Schizophrenia, unspecified: Secondary | ICD-10-CM | POA: Diagnosis not present

## 2019-04-05 LAB — CBC WITH DIFFERENTIAL/PLATELET
Abs Immature Granulocytes: 0.04 10*3/uL (ref 0.00–0.07)
Basophils Absolute: 0 10*3/uL (ref 0.0–0.1)
Basophils Relative: 0 %
Eosinophils Absolute: 0.1 10*3/uL (ref 0.0–0.5)
Eosinophils Relative: 3 %
HCT: 32.3 % — ABNORMAL LOW (ref 39.0–52.0)
Hemoglobin: 10.3 g/dL — ABNORMAL LOW (ref 13.0–17.0)
Immature Granulocytes: 1 %
Lymphocytes Relative: 26 %
Lymphs Abs: 1.4 10*3/uL (ref 0.7–4.0)
MCH: 29.4 pg (ref 26.0–34.0)
MCHC: 31.9 g/dL (ref 30.0–36.0)
MCV: 92.3 fL (ref 80.0–100.0)
Monocytes Absolute: 0.6 10*3/uL (ref 0.1–1.0)
Monocytes Relative: 12 %
Neutro Abs: 3 10*3/uL (ref 1.7–7.7)
Neutrophils Relative %: 58 %
Platelets: 237 10*3/uL (ref 150–400)
RBC: 3.5 MIL/uL — ABNORMAL LOW (ref 4.22–5.81)
RDW: 13.8 % (ref 11.5–15.5)
WBC: 5.2 10*3/uL (ref 4.0–10.5)
nRBC: 0 % (ref 0.0–0.2)

## 2019-04-05 LAB — BASIC METABOLIC PANEL
Anion gap: 11 (ref 5–15)
BUN: 20 mg/dL (ref 8–23)
CO2: 21 mmol/L — ABNORMAL LOW (ref 22–32)
Calcium: 8.9 mg/dL (ref 8.9–10.3)
Chloride: 100 mmol/L (ref 98–111)
Creatinine, Ser: 0.83 mg/dL (ref 0.61–1.24)
GFR calc Af Amer: >60 mL/min (ref >60)
GFR calc non Af Amer: >60 mL/min (ref >60)
Glucose, Bld: 167 mg/dL — ABNORMAL HIGH (ref 70–99)
Potassium: 4.2 mmol/L (ref 3.5–5.1)
Sodium: 132 mmol/L — ABNORMAL LOW (ref 135–145)

## 2019-04-05 LAB — SALICYLATE LEVEL: Salicylate Lvl: <7 mg/dL (ref 2.8–30.0)

## 2019-04-05 LAB — ETHANOL: Alcohol, Ethyl (B): <10 mg/dL (ref <10)

## 2019-04-05 LAB — ACETAMINOPHEN LEVEL: Acetaminophen (Tylenol), Serum: <10 ug/mL — ABNORMAL LOW (ref 10–30)

## 2019-04-05 MED ORDER — LORAZEPAM 0.5 MG PO TABS
0.5000 mg | ORAL_TABLET | Freq: Two times a day (BID) | ORAL | Status: DC
  Administered 2019-04-05 – 2019-04-07 (×4): 0.5 mg via ORAL
  Filled 2019-04-05 (×4): qty 1

## 2019-04-05 MED ORDER — BENZTROPINE MESYLATE 1 MG PO TABS
0.5000 mg | ORAL_TABLET | Freq: Two times a day (BID) | ORAL | Status: DC
  Administered 2019-04-05 – 2019-04-07 (×4): 0.5 mg via ORAL
  Filled 2019-04-05 (×4): qty 1

## 2019-04-05 MED ORDER — METFORMIN HCL 500 MG PO TABS
1000.0000 mg | ORAL_TABLET | Freq: Two times a day (BID) | ORAL | Status: DC
  Administered 2019-04-05 – 2019-04-07 (×4): 1000 mg via ORAL
  Filled 2019-04-05 (×4): qty 2

## 2019-04-05 MED ORDER — QUETIAPINE FUMARATE 200 MG PO TABS
200.0000 mg | ORAL_TABLET | Freq: Once | ORAL | Status: AC
  Administered 2019-04-05: 23:00:00 200 mg via ORAL
  Filled 2019-04-05: qty 1

## 2019-04-05 MED ORDER — DIVALPROEX SODIUM 500 MG PO DR TAB
500.0000 mg | DELAYED_RELEASE_TABLET | Freq: Every day | ORAL | Status: DC
  Administered 2019-04-05 – 2019-04-06 (×2): 500 mg via ORAL
  Filled 2019-04-05 (×2): qty 1

## 2019-04-05 MED ORDER — TRAZODONE HCL 50 MG PO TABS
50.0000 mg | ORAL_TABLET | Freq: Every day | ORAL | Status: DC
  Administered 2019-04-05 – 2019-04-06 (×2): 50 mg via ORAL
  Filled 2019-04-05: qty 1

## 2019-04-05 NOTE — ED Triage Notes (Signed)
Patient from [facility] assist living, sent to ed for combative and aggressive behavior toward staff. Patient arrives to ed, mumbling and repetitive crying from time to time, but cooperative.

## 2019-04-05 NOTE — ED Notes (Signed)
Patient sitting on side of bed continues to talk. Patient has not stopped talking since arrival, has been having episodes of crying and then continues to talk. Safety maintained. Patient offered something to drink and bathroom. Patient talkative but cooperative.

## 2019-04-05 NOTE — ED Notes (Signed)
Patient mumbling and talking loud since he came in with sherish from group home. Has not stopped taking.

## 2019-04-05 NOTE — ED Notes (Signed)
Patient calm but continues to talk and talk Probation officer having a difficulty time understanding  what patient talking about
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
## 2019-04-05 NOTE — ED Provider Notes (Signed)
[facility] Emergency Department Provider Note  ____________________________________________   First MD Initiated Contact with Patient 04/05/19 1609     (approximate)  I have reviewed the triage vital signs and the nursing notes.   HISTORY  Chief Complaint Psychiatric Evaluation and Aggressive Behavior    HPI Scott [name] is a 68 y.o. male with schizophrenia who comes in from [facility] assisted living due to being combative and having aggressive behavior towards staff.  They filled out IVC paperwork and had patient brought here.  Patient himself is hard to understand due to him being edentulous but he denies any fevers or cough or other medical concerns.  He denies any SI HI auditory visual hallucinations.  Unclear when the aggression started, constant, severe, nothing makes it better, nothing seems to brought it on.     Past Medical History:  Diagnosis Date  . Aggression aggravated 08/20/2018  . Anxiety   . Chronic hyponatremia 05/2013, 11/2013  . Dementia ([place])   . Dyslipidemia   . GERD (gastroesophageal reflux disease)   . Hypertension   . Hypoglycemia   . Schizophrenia ([place])   . Seizures ([place])   . Syncope   . Tardive dyskinesia   . Urinary tract infection   . Vitamin D deficiency     Patient Active Problem List   Diagnosis Date Noted  . Aggression aggravated 08/20/2018  . Generalized weakness 10/14/2017  . Schizoaffective disorder, bipolar type ([place]) 02/07/2016  . Diabetes ([place]) 02/07/2016  . Schizophrenia ([place]) 01/24/2016  . GERD (gastroesophageal reflux disease) 01/24/2016  . Tardive dyskinesia 01/24/2016  . Chronic hyponatremia 01/24/2016  . Hypertension 01/24/2016    Past Surgical History:  Procedure Laterality Date  . CATARACT EXTRACTION W/PHACO Left 01/10/2016   Procedure: CATARACT EXTRACTION PHACO AND INTRAOCULAR LENS PLACEMENT (IOC);  Surgeon: Birder Robson, MD;  Location: [facility] ORS;  Service: Ophthalmology;   Laterality: Left;  [place] 00:53 AP% 24.2 CDE 12.88 fluid pack lot # [id number]  . EYE SURGERY    . FOOT SURGERY      Prior to Admission medications   Medication Sig Start Date End Date Taking? Authorizing Provider  acetaminophen (TYLENOL) 500 MG tablet Take 500 mg by mouth every 6 (six) hours as needed.    [provider]  benztropine (COGENTIN) 0.5 MG tablet Take 0.5 mg by mouth 2 (two) times daily.    [provider]  cetirizine (ZYRTEC) 10 MG tablet Take 10 mg by mouth daily.    [provider]  divalproex (DEPAKOTE) 500 MG DR tablet Take 500 mg by mouth at bedtime.     [provider]  famotidine (PEPCID) 20 MG tablet Take 20 mg by mouth daily. 05/05/18   [provider]  haloperidol decanoate (HALDOL DECANOATE) 50 MG/ML injection Inject 100 mg into the muscle every 28 (twenty-eight) days.    [provider]  Iron Combinations (NIFEREX) TABS Take 1 tablet by mouth daily.    [provider]  LORazepam (ATIVAN) 0.5 MG tablet Take 0.5 mg by mouth 2 (two) times daily.    [provider]  metFORMIN (GLUCOPHAGE) 1000 MG tablet Take 1,000 mg by mouth 2 (two) times daily. 06/10/18   [provider]  Polyethylene Glycol 3350 (PEG 3350) POWD Take 17 g by mouth as needed.    [provider]  QUEtiapine (SEROQUEL) 200 MG tablet Take 200 mg by mouth 2 (two) times daily.     [provider]  QUEtiapine (SEROQUEL) 50 MG  tablet Take 50 mg by mouth 2 (two) times daily. Care giver states he also takes Seroquel 200mg  at bedtime    [provider]  senna-docusate (SENOKOT-S) 8.6-50 MG tablet Take 1 tablet by mouth 2 (two) times daily.    [provider]  Skin Protectants, Misc. (BAZA PROTECT EX) Apply 1 application topically daily as needed.    [provider]  tamsulosin (FLOMAX) 0.4 MG CAPS capsule Take 0.4 mg by mouth daily after breakfast.    [provider]  traZODone (DESYREL)  50 MG tablet Take 50 mg by mouth at bedtime. 05/26/18   [provider]  Vitamin D, Ergocalciferol, (DRISDOL) 50000 units CAPS capsule Take 50,000 Units by mouth every 7 (seven) days.    [provider]    Allergies Patient has no known allergies.  History reviewed. No pertinent family history.  Social History Social History   Tobacco Use  . Smoking status: Current Every Day Smoker    Packs/day: 0.50    Types: Cigarettes  . Smokeless tobacco: Never Used  Substance Use Topics  . Alcohol use: No  . Drug use: No      Review of Systems Constitutional: No fever/chills Eyes: No visual changes. ENT: No sore throat. Cardiovascular: Denies chest pain. Respiratory: Denies shortness of breath. Gastrointestinal: No abdominal pain.  No nausea, no vomiting.  No diarrhea.  No constipation. Genitourinary: Negative for dysuria. Musculoskeletal: Negative for back pain. Skin: Negative for rash. Neurological: Negative for headaches, focal weakness or numbness. All other ROS negative ____________________________________________   PHYSICAL EXAM:  VITAL SIGNS: ED Triage Vitals [04/05/19 1556]  Enc Vitals Group     BP (!) 152/98     Pulse Rate (!) 106     Resp (!) 22     Temp 98.8 F (37.1 C)     Temp Source Oral     SpO2 98 %     Weight      Height      Head Circumference      Peak Flow      Pain Score      Pain Loc      Pain Edu?      Excl. in [place]?     Constitutional: Alert and oriented. Well appearing and in no acute distress. Eyes: Conjunctivae are normal. EOMI. Head: Atraumatic. Nose: No congestion/rhinnorhea. Mouth/Throat: Mucous membranes are moist.  Edentulous. Neck: No stridor. Trachea Midline. FROM Cardiovascular: Normal rate, regular rhythm. Grossly normal heart sounds.  Good peripheral circulation. Respiratory: Normal respiratory effort.  No retractions. Lungs CTAB. Gastrointestinal: Soft and nontender. No distention. No abdominal bruits.   Musculoskeletal: No lower extremity tenderness nor edema.  No joint effusions. Neurologic: No facial droop.  No gross focal neurologic deficits are appreciated.  Skin:  Skin is warm, dry and intact. No rash noted. Psychiatric: Difficult to understand due to being edentulous but denies SI HI or auditory visual hallucinations. GU: Deferred   ____________________________________________   LABS (all labs ordered are listed, but only abnormal results are displayed)  Labs Reviewed  CBC WITH DIFFERENTIAL/PLATELET  BASIC METABOLIC PANEL  ETHANOL  URINE DRUG SCREEN, QUALITATIVE ([place])  SALICYLATE LEVEL  ACETAMINOPHEN LEVEL   ____________________________________________   INITIAL IMPRESSION / ASSESSMENT AND PLAN / ED COURSE  Scott [name] was evaluated in Emergency Department on 04/05/2019 for the symptoms described in the history of present illness. He was evaluated in the context of the global COVID-19 pandemic, which necessitated consideration that the patient might be  at risk for infection with the SARS-CoV-2 virus that causes COVID-19. Institutional protocols and algorithms that pertain to the evaluation of patients at risk for COVID-19 are in a state of rapid change based on information released by regulatory bodies including the CDC and federal and state organizations. These policies and algorithms were followed during the patient's care in the ED.     Pt is without any acute medical complaints. No exam findings to suggest medical cause of current presentation. Will order psychiatric screening labs and discuss further w/ psychiatric service.  D/d includes but is not limited to psychiatric disease, behavioral/personality disorder, inadequate socioeconomic support, medical.  Based on HPI, exam, unremarkable labs, no concern for acute medical problem at this time. No rigidity, clonus, hyperthermia, focal neurologic deficit, diaphoresis, tachycardia, meningismus, ataxia, gait  abnormality or other finding to suggest this visit represents a non-psychiatric problem. Screening labs reviewed.    Given this, pt medically cleared, to be dispositioned per Psych.        ____________________________________________   FINAL CLINICAL IMPRESSION(S) / ED DIAGNOSES   Final diagnoses:  Evaluation by psychiatric service required      MEDICATIONS GIVEN DURING THIS VISIT:  Medications - No data to display   ED Discharge Orders    None       Note:  This document was prepared using Dragon voice recognition software and may include unintentional dictation errors.   Vanessa Truesdale, MD 04/05/19 [phone]

## 2019-04-05 NOTE — ED Notes (Signed)
Patient awaiting psych eval in morning.

## 2019-04-05 NOTE — ED Notes (Signed)
Scheduled night time meds given.

## 2019-04-06 DIAGNOSIS — F209 Schizophrenia, unspecified: Secondary | ICD-10-CM | POA: Diagnosis not present

## 2019-04-06 LAB — URINE DRUG SCREEN, QUALITATIVE (ARMC ONLY)
Amphetamines, Ur Screen: NOT DETECTED
Barbiturates, Ur Screen: NOT DETECTED
Benzodiazepine, Ur Scrn: NOT DETECTED
Cannabinoid 50 Ng, Ur ~~LOC~~: NOT DETECTED
Cocaine Metabolite,Ur ~~LOC~~: NOT DETECTED
MDMA (Ecstasy)Ur Screen: NOT DETECTED
Methadone Scn, Ur: NOT DETECTED
Opiate, Ur Screen: NOT DETECTED
Phencyclidine (PCP) Ur S: NOT DETECTED
Tricyclic, Ur Screen: POSITIVE — AB

## 2019-04-06 LAB — URINALYSIS, ROUTINE W REFLEX MICROSCOPIC
Bacteria, UA: NONE SEEN
Bilirubin Urine: NEGATIVE
Glucose, UA: NEGATIVE mg/dL
Hgb urine dipstick: NEGATIVE
Ketones, ur: NEGATIVE mg/dL
Nitrite: NEGATIVE
Protein, ur: NEGATIVE mg/dL
Specific Gravity, Urine: 1.011 (ref 1.005–1.030)
pH: 7 (ref 5.0–8.0)

## 2019-04-06 MED ORDER — DIPHENHYDRAMINE HCL 50 MG/ML IJ SOLN
50.0000 mg | Freq: Once | INTRAMUSCULAR | Status: AC
  Administered 2019-04-06: 20:00:00 50 mg via INTRAMUSCULAR
  Filled 2019-04-06: qty 1

## 2019-04-06 MED ORDER — HALOPERIDOL 5 MG PO TABS
5.0000 mg | ORAL_TABLET | ORAL | Status: AC
  Administered 2019-04-06: 17:00:00 5 mg via ORAL
  Filled 2019-04-06: qty 1

## 2019-04-06 MED ORDER — LORAZEPAM 2 MG/ML IJ SOLN
2.0000 mg | Freq: Once | INTRAMUSCULAR | Status: AC
  Administered 2019-04-06: 20:00:00 2 mg via INTRAMUSCULAR
  Filled 2019-04-06: qty 1

## 2019-04-06 MED ORDER — HALOPERIDOL LACTATE 5 MG/ML IJ SOLN
5.0000 mg | Freq: Once | INTRAMUSCULAR | Status: AC
  Administered 2019-04-06: 20:00:00 5 mg via INTRAMUSCULAR
  Filled 2019-04-06: qty 1

## 2019-04-06 MED ORDER — LORAZEPAM 1 MG PO TABS
1.0000 mg | ORAL_TABLET | Freq: Once | ORAL | Status: AC
  Administered 2019-04-06: 09:00:00 1 mg via ORAL
  Filled 2019-04-06: qty 1

## 2019-04-06 NOTE — ED Notes (Addendum)
BEHAVIORAL HEALTH ROUNDING Patient sleeping: No. Patient alert : yes Behavior appropriate: Yes.  ; If no, describe:  Nutrition and fluids offered: yes Toileting and hygiene offered: Yes  Sitter present: q15 minute observations Law enforcement present: Yes

## 2019-04-06 NOTE — ED Notes (Signed)
BEHAVIORAL HEALTH ROUNDING Patient sleeping: No. Patient alert and oriented: yes Behavior appropriate: Yes.  ; If no, describe:  Nutrition and fluids offered: yes Toileting and hygiene offered: Yes  Sitter present: q15 minute observations  Law enforcement present: Yes [employer]  
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
## 2019-04-06 NOTE — ED Notes (Signed)
BEHAVIORAL HEALTH ROUNDING Patient sleeping: No. Patient alert : yes Behavior appropriate: Yes.  ; If no, describe:  Nutrition and fluids offered: yes Toileting and hygiene offered: Yes  Sitter present: q15 minute observations  Law enforcement present: Yes  [employer]  ENVIRONMENTAL ASSESSMENT Potentially harmful objects out of patient reach: Yes.   Personal belongings secured: Yes.   Patient dressed in hospital provided attire only: Yes.   Plastic bags out of patient reach: Yes.   Patient care equipment (cords, cables, call bells, lines, and drains) shortened, removed, or accounted for: Yes.   Equipment and supplies removed from bottom of stretcher: Yes.   Potentially toxic materials out of patient reach: Yes.   Sharps container removed or out of patient reach: Yes.

## 2019-04-06 NOTE — ED Notes (Signed)
Patient observed lying in bed with eyes closed  Even, unlabored respirations observed   NAD pt appears to be sleeping  I will continue to monitor along with every 15 minute visual observations   He is IVC  [employer] is on hallway

## 2019-04-06 NOTE — ED Notes (Signed)
BEHAVIORAL HEALTH ROUNDING Patient sleeping: No. Patient alert : yes Behavior appropriate: Yes.  ; If no, describe:  Nutrition and fluids offered: yes Toileting and hygiene offered: Yes  Sitter present: q15 minute observations  Law enforcement present: Yes  [employer]

## 2019-04-06 NOTE — ED Notes (Signed)
Patient was being verbally aggressive, and screaming out curse words. Verbal order for Ativan, Haldol, and benadryl to be given IM. Patient took medications with no issues.

## 2019-04-06 NOTE — BH Assessment (Addendum)
TTS attempted to call patient's place of residence to arrange transport - [facility] [place], [place]) [phone] - No answer  This information has been relayed to pt's nurse.

## 2019-04-06 NOTE — ED Notes (Signed)
BEHAVIORAL HEALTH ROUNDING Patient sleeping: No. Patient alert and oriented: yes Behavior appropriate: Yes.  ; If no, describe:  Nutrition and fluids offered: yes Toileting and hygiene offered: Yes  Sitter present: q15 minute observations Law enforcement present: Yes

## 2019-04-06 NOTE — ED Notes (Signed)
Offered pt a shower and pt refused.

## 2019-04-06 NOTE — ED Notes (Signed)
ED  Is the patient under IVC or is there intent for IVC: Yes.   Is the patient medically cleared: Yes.   Is there vacancy in the ED BHU: Yes.   Is the population mix appropriate for patient: Yes.  He is incontinent at times - every two hours toleting must be offered  Is the patient awaiting placement in inpatient or outpatient setting:  Has the patient had a psychiatric consult: psych assessment pending   Survey of unit performed for contraband, proper placement and condition of furniture, tampering with fixtures in bathroom, shower, and each patient room: Yes.  ; Findings:  APPEARANCE/BEHAVIOR Calm and cooperative NEURO ASSESSMENT Orientation: oriented to self, place  Denies pain Hallucinations: No.None noted (Hallucinations) denies  Speech: Normal - loud and rapid at times  Gait: normal RESPIRATORY ASSESSMENT Even  Unlabored respirations  CARDIOVASCULAR ASSESSMENT Pulses equal   regular rate  Skin warm and dry   GASTROINTESTINAL ASSESSMENT no GI complaint EXTREMITIES Full ROM  PLAN OF CARE Provide calm/safe environment. Vital signs assessed twice daily. ED BHU Assessment once each 12-hour shift. Collaborate with TTS daily or as condition indicates. Assure the ED provider has rounded once each shift. Provide and encourage hygiene. Provide redirection as needed. Assess for escalating behavior; address immediately and inform ED provider.  Assess family dynamic and appropriateness for visitation as needed: Yes.  ; If necessary, describe findings:  Educate the patient/family about BHU procedures/visitation: Yes.  ; If necessary, describe findings:

## 2019-04-06 NOTE — ED Notes (Signed)
Spoke with nurse at [facility] in [place], [place]  Per nurse patient unable to return until someone speaks with Director of Nursing or [place]. Due to patient aggressive behavior prior to ED visit. Charge RN Raquel made aware.

## 2019-04-07 DIAGNOSIS — F209 Schizophrenia, unspecified: Secondary | ICD-10-CM | POA: Diagnosis not present

## 2019-04-07 LAB — SARS CORONAVIRUS 2 BY RT PCR (HOSPITAL ORDER, PERFORMED IN ~~LOC~~ HOSPITAL LAB): SARS Coronavirus 2: NEGATIVE

## 2019-04-07 NOTE — ED Notes (Signed)
Pt given diet sprite. Pt reports that he will be "good" today. Pt calm, cooperative, and pleasant at this time. Pt asked this RN how I was doing, pt stated multiple times that today he will be good.

## 2019-04-07 NOTE — ED Notes (Signed)
Pt given meal tray.

## 2019-04-07 NOTE — ED Notes (Signed)
Pt defecated on floor in room. Pt then directed to bathroom to finish. Pt then given bath with warm wipes and wiped self with. Back wiped by this RN. Pt given clean gown, new sheets, new socks. Pt calm, cooperative during this. Pt's room also mopped.

## 2019-04-07 NOTE — ED Notes (Signed)
Rapid covid obtained and sent to lab

## 2019-04-07 NOTE — ED Notes (Signed)
Assumed care of patient aox4, as per prior nurse caring for patient, patient took shower willingly, ate 100% of his breakfast. Patient awaiting transport back to his facility. covid swab completed. Patient calm and cooperative this morning. Safety maintained. Will continue to monitor.

## 2019-04-07 NOTE — ED Provider Notes (Signed)
Patient has been cleared by psychiatry for discharge.   Earleen Newport, MD 04/07/19 1350

## 2019-04-07 NOTE — BH Assessment (Addendum)
This Probation officer, along with ED Psychiatrist, spoke to a staff member Janett Billow, RN) at pt's SNF [facility]) who reports patient can return to his residence.  Call the Care Transition Advisor - Cari Caraway [phone]) to coordinate transport back to facility.  Per [facility] RN, "arrange transport through EMS - non-emergency transport - Pt is to return to Rm: [id number]"

## 2019-04-07 NOTE — ED Provider Notes (Signed)
-----------------------------------------   3:40 AM on 04/07/2019 -----------------------------------------   Blood pressure 135/78, pulse 78, temperature 98.4 F (36.9 C), temperature source Oral, resp. rate 19, SpO2 98 %.  The patient had no acute events since last update.  Calm and cooperative at this time.  Disposition is pending per Psychiatry/Behavioral Medicine team recommendations.     Alfred Levins, [place], MD 04/07/19 [phone]

## 2019-04-07 NOTE — ED Notes (Signed)
Safe  Transport  Called  To  Transport  Pt  To  [facility]  In  [place]  rn

## 2020-02-16 ENCOUNTER — Encounter: Payer: Self-pay | Admitting: Emergency Medicine

## 2020-02-16 ENCOUNTER — Other Ambulatory Visit: Payer: Self-pay

## 2020-02-16 DIAGNOSIS — F17210 Nicotine dependence, cigarettes, uncomplicated: Secondary | ICD-10-CM | POA: Insufficient documentation

## 2020-02-16 DIAGNOSIS — Z20822 Contact with and (suspected) exposure to covid-19: Secondary | ICD-10-CM | POA: Diagnosis not present

## 2020-02-16 DIAGNOSIS — Z79899 Other long term (current) drug therapy: Secondary | ICD-10-CM | POA: Diagnosis not present

## 2020-02-16 DIAGNOSIS — Z7984 Long term (current) use of oral hypoglycemic drugs: Secondary | ICD-10-CM | POA: Diagnosis not present

## 2020-02-16 DIAGNOSIS — I10 Essential (primary) hypertension: Secondary | ICD-10-CM | POA: Insufficient documentation

## 2020-02-16 DIAGNOSIS — F209 Schizophrenia, unspecified: Secondary | ICD-10-CM | POA: Insufficient documentation

## 2020-02-16 DIAGNOSIS — R258 Other abnormal involuntary movements: Secondary | ICD-10-CM | POA: Insufficient documentation

## 2020-02-16 DIAGNOSIS — F258 Other schizoaffective disorders: Secondary | ICD-10-CM | POA: Insufficient documentation

## 2020-02-16 DIAGNOSIS — E119 Type 2 diabetes mellitus without complications: Secondary | ICD-10-CM | POA: Insufficient documentation

## 2020-02-16 DIAGNOSIS — F0390 Unspecified dementia without behavioral disturbance: Secondary | ICD-10-CM | POA: Insufficient documentation

## 2020-02-16 LAB — COMPREHENSIVE METABOLIC PANEL
ALT: 26 U/L (ref 0–44)
AST: 31 U/L (ref 15–41)
Albumin: 3.6 g/dL (ref 3.5–5.0)
Alkaline Phosphatase: 167 U/L — ABNORMAL HIGH (ref 38–126)
Anion gap: 11 (ref 5–15)
BUN: 26 mg/dL — ABNORMAL HIGH (ref 8–23)
CO2: 22 mmol/L (ref 22–32)
Calcium: 9.3 mg/dL (ref 8.9–10.3)
Chloride: 96 mmol/L — ABNORMAL LOW (ref 98–111)
Creatinine, Ser: 0.91 mg/dL (ref 0.61–1.24)
GFR calc Af Amer: >60 mL/min (ref >60)
GFR calc non Af Amer: >60 mL/min (ref >60)
Glucose, Bld: 119 mg/dL — ABNORMAL HIGH (ref 70–99)
Potassium: 5.3 mmol/L — ABNORMAL HIGH (ref 3.5–5.1)
Sodium: 129 mmol/L — ABNORMAL LOW (ref 135–145)
Total Bilirubin: 0.6 mg/dL (ref 0.3–1.2)
Total Protein: 7.7 g/dL (ref 6.5–8.1)

## 2020-02-16 LAB — CBC
HCT: 30.1 % — ABNORMAL LOW (ref 39.0–52.0)
Hemoglobin: 9.8 g/dL — ABNORMAL LOW (ref 13.0–17.0)
MCH: 27 pg (ref 26.0–34.0)
MCHC: 32.6 g/dL (ref 30.0–36.0)
MCV: 82.9 fL (ref 80.0–100.0)
Platelets: 282 10*3/uL (ref 150–400)
RBC: 3.63 MIL/uL — ABNORMAL LOW (ref 4.22–5.81)
RDW: 15.3 % (ref 11.5–15.5)
WBC: 7.6 10*3/uL (ref 4.0–10.5)
nRBC: 0 % (ref 0.0–0.2)

## 2020-02-16 LAB — ETHANOL: Alcohol, Ethyl (B): <10 mg/dL (ref <10)

## 2020-02-16 LAB — SALICYLATE LEVEL: Salicylate Lvl: <7 mg/dL — ABNORMAL LOW (ref 7.0–30.0)

## 2020-02-16 LAB — ACETAMINOPHEN LEVEL: Acetaminophen (Tylenol), Serum: <10 ug/mL — ABNORMAL LOW (ref 10–30)
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
## 2020-02-17 ENCOUNTER — Emergency Department
Admission: EM | Admit: 2020-02-17 | Discharge: 2020-02-22 | Disposition: A | Discharge status: Other Acute Inpatient Hospital | Payer: Medicare Other | Attending: Emergency Medicine | Admitting: Emergency Medicine

## 2020-02-17 DIAGNOSIS — F209 Schizophrenia, unspecified: Secondary | ICD-10-CM

## 2020-02-17 LAB — SARS CORONAVIRUS 2 BY RT PCR (HOSPITAL ORDER, PERFORMED IN ~~LOC~~ HOSPITAL LAB): SARS Coronavirus 2: NEGATIVE

## 2020-02-17 LAB — GLUCOSE, CAPILLARY: Glucose-Capillary: 217 mg/dL — ABNORMAL HIGH (ref 70–99)

## 2020-02-17 MED ORDER — ZIPRASIDONE MESYLATE 20 MG IM SOLR
20.0000 mg | Freq: Once | INTRAMUSCULAR | Status: AC
  Administered 2020-02-17: 20 mg via INTRAMUSCULAR
  Filled 2020-02-17: qty 20

## 2020-02-17 MED ORDER — ZIPRASIDONE MESYLATE 20 MG IM SOLR
20.0000 mg | Freq: Once | INTRAMUSCULAR | Status: AC
  Administered 2020-02-17: 20 mg via INTRAMUSCULAR

## 2020-02-17 MED ORDER — LORAZEPAM 2 MG/ML IJ SOLN
2.0000 mg | Freq: Once | INTRAMUSCULAR | Status: AC
  Administered 2020-02-17: 2 mg via INTRAMUSCULAR

## 2020-02-17 MED ORDER — LORAZEPAM 2 MG/ML IJ SOLN
2.0000 mg | Freq: Once | INTRAMUSCULAR | Status: DC
  Filled 2020-02-17: qty 1

## 2020-02-17 MED ORDER — DIPHENHYDRAMINE HCL 50 MG/ML IJ SOLN
50.0000 mg | Freq: Once | INTRAMUSCULAR | Status: AC
  Administered 2020-02-17: 50 mg via INTRAMUSCULAR
  Filled 2020-02-17: qty 1

## 2020-02-17 MED ORDER — HALOPERIDOL LACTATE 5 MG/ML IJ SOLN
5.0000 mg | Freq: Once | INTRAMUSCULAR | Status: AC
  Administered 2020-02-17: 5 mg via INTRAMUSCULAR
  Filled 2020-02-17: qty 1

## 2020-02-17 NOTE — ED Notes (Signed)
Assisted pt with changing his brief and bed linen because it was wet. Cleaned him up and changed the bed linen as well.

## 2020-02-17 NOTE — BH Assessment (Signed)
Writer updated patient's [facility] DSS Guardian Wallace Cullens). Updated her about patient's disposition.

## 2020-02-17 NOTE — ED Notes (Signed)
Pt sitting up on bottom of stretcher with legs hanging off edge. Pt talking loudly to no one in particular. Pt states someone lied about him and he is wanting to go home. Pt states he is tired of this place and is ready to go and isn't going to stay here anymore.

## 2020-02-17 NOTE — ED Notes (Signed)
Pt yelling out loudly for someone to bring his ice cream from the kitchen. lights off to enhance rest. Provided for pt safety and comfort. Encouraged pt to use more quiet voice. Verbalized understanding. Will continue to assess.

## 2020-02-17 NOTE — ED Notes (Signed)
Patient fed a puree diet due to not having teeth to chew with. Patient ate 100% of his meal.

## 2020-02-17 NOTE — ED Notes (Signed)
Patient had very large soft bowel movement, peri care and change of linen provided.

## 2020-02-17 NOTE — ED Provider Notes (Addendum)
[facility] Emergency Department Provider Note  ____________________________________________   First MD Initiated Contact with Patient 02/17/20 0113     (approximate)  I have reviewed the triage vital signs and the nursing notes.  Level 5 caveat history review of system limited secondary to aggression HISTORY  Chief Complaint Psychiatric Evaluation    HPI Scott [name] is a 69 y.o. male with below list of previous medical conditions including dementia schizophrenia presents to the emergency department via [facility] from the [facility] involuntarily committed secondary to violent behavior aggression towards a staff threatening other patients.  Patient initially calm and cooperative on arrival to the emergency department which quickly changed to aggressive behavior toward staff.        Past Medical History:  Diagnosis Date  . Aggression aggravated 08/20/2018  . Anxiety   . Chronic hyponatremia 05/2013, 11/2013  . Dementia ([place])   . Dyslipidemia   . GERD (gastroesophageal reflux disease)   . Hypertension   . Hypoglycemia   . Schizophrenia ([place])   . Seizures ([place])   . Syncope   . Tardive dyskinesia   . Urinary tract infection   . Vitamin D deficiency     Patient Active Problem List   Diagnosis Date Noted  . Aggression aggravated 08/20/2018  . Generalized weakness 10/14/2017  . Other schizoaffective disorders ([place]) 02/07/2016  . Diabetes ([place]) 02/07/2016  . Schizophrenia ([place]) 01/24/2016  . GERD (gastroesophageal reflux disease) 01/24/2016  . Tardive dyskinesia 01/24/2016  . Chronic hyponatremia 01/24/2016  . Hypertension 01/24/2016    Past Surgical History:  Procedure Laterality Date  . CATARACT EXTRACTION W/PHACO Left 01/10/2016   Procedure: CATARACT EXTRACTION PHACO AND INTRAOCULAR LENS PLACEMENT (IOC);  Surgeon: Birder Robson, MD;  Location: [facility] ORS;  Service: Ophthalmology;  Laterality: Left;  [place] 00:53 AP%  24.2 CDE 12.88 fluid pack lot # [id number]  . EYE SURGERY    . FOOT SURGERY      Prior to Admission medications   Medication Sig Start Date End Date Taking? Authorizing Provider  benztropine (COGENTIN) 0.5 MG tablet Take 0.5 mg by mouth 2 (two) times daily.    [provider]  cetirizine (ZYRTEC) 10 MG tablet Take 10 mg by mouth daily.    [provider]  divalproex (DEPAKOTE) 500 MG DR tablet Take 500 mg by mouth at bedtime.     [provider]  famotidine (PEPCID) 20 MG tablet Take 20 mg by mouth daily. 05/05/18   [provider]  haloperidol decanoate (HALDOL DECANOATE) 50 MG/ML injection Inject 100 mg into the muscle every 28 (twenty-eight) days.    [provider]  Iron Combinations (NIFEREX) TABS Take 1 tablet by mouth daily.    [provider]  LORazepam (ATIVAN) 0.5 MG tablet Take 0.5 mg by mouth 2 (two) times daily.    [provider]  metFORMIN (GLUCOPHAGE) 1000 MG tablet Take 1,000 mg by mouth 2 (two) times daily. 06/10/18   [provider]  Polyethylene Glycol 3350 (PEG 3350) POWD Take 17 g by mouth daily as needed.     [provider]  QUEtiapine (SEROQUEL) 100 MG tablet Take 100 mg by mouth at bedtime.     [provider]  QUEtiapine (SEROQUEL) 50 MG tablet Take 25 mg by mouth every morning.     [provider]  senna-docusate (SENOKOT-S) 8.6-50 MG tablet Take 1 tablet by mouth daily as needed.     [provider]  tamsulosin [facility])  0.4 MG CAPS capsule Take 0.4 mg by mouth every evening.     [provider]  traZODone (DESYREL) 100 MG tablet Take 100 mg by mouth at bedtime.  05/26/18   [provider]  Vitamin D, Ergocalciferol, (DRISDOL) 50000 units CAPS capsule Take 50,000 Units by mouth every 7 (seven) days.    [provider]    Allergies Patient has no known allergies.  History reviewed. No pertinent family history.  Social  History Social History   Tobacco Use  . Smoking status: Current Every Day Smoker    Packs/day: 0.50    Types: Cigarettes  . Smokeless tobacco: Never Used  Vaping Use  . Vaping Use: Never used  Substance Use Topics  . Alcohol use: No  . Drug use: No    Review of Systems Constitutional: No fever/chills Eyes: No visual changes. ENT: No sore throat. Cardiovascular: Denies chest pain. Respiratory: Denies shortness of breath. Gastrointestinal: No abdominal pain.  No nausea, no vomiting.  No diarrhea.  No constipation. Genitourinary: Negative for dysuria. Musculoskeletal: Negative for neck pain.  Negative for back pain. Integumentary: Negative for rash. Neurological: Negative for headaches, focal weakness or numbness. Psychiatric: Positive for aggression and violent behavior  ____________________________________________   PHYSICAL EXAM:  VITAL SIGNS: ED Triage Vitals  Enc Vitals Group     BP 02/16/20 2158 122/67     Pulse Rate 02/16/20 2158 100     Resp 02/16/20 2158 14     Temp 02/16/20 2158 98.5 F (36.9 C)     Temp Source 02/16/20 2158 Oral     SpO2 02/16/20 2158 100 %     Weight 02/16/20 2159 90.7 kg (200 lb)     Height 02/16/20 2159 1.93 m (6\' 4" )     Head Circumference --      Peak Flow --      Pain Score 02/16/20 2159 0     Pain Loc --      Pain Edu? --      Excl. in [place]? --     Constitutional: Alert and oriented.  Eyes: Conjunctivae are normal.  Head: Atraumatic.Marland Kitchen Mouth/Throat: Patient is wearing a mask. Neck: No stridor.  No meningeal signs.   Cardiovascular: Normal rate, regular rhythm. Good peripheral circulation. Grossly normal heart sounds. Respiratory: Normal respiratory effort.  No retractions. Gastrointestinal: Soft and nontender. No distention.  Musculoskeletal: No lower extremity tenderness nor edema. No gross deformities of extremities. Neurologic:  Normal speech and language. No gross focal neurologic deficits are appreciated.  Skin:  Skin is  warm, dry and intact. Psychiatric: Agitated, verbally abusive to staff, aggressive behavior  ____________________________________________   LABS (all labs ordered are listed, but only abnormal results are displayed)  Labs Reviewed  COMPREHENSIVE METABOLIC PANEL - Abnormal; Notable for the following components:      Result Value   Sodium 129 (*)    Potassium 5.3 (*)    Chloride 96 (*)    Glucose, Bld 119 (*)    BUN 26 (*)    Alkaline Phosphatase 167 (*)    All other components within normal limits  SALICYLATE LEVEL - Abnormal; Notable for the following components:   Salicylate Lvl <3.0 (*)    All other components within normal limits  ACETAMINOPHEN LEVEL - Abnormal; Notable for the following components:   Acetaminophen (Tylenol), Serum <10 (*)    All other components within normal limits  CBC - Abnormal; Notable for the following components:   RBC 3.63 (*)  Hemoglobin 9.8 (*)    HCT 30.1 (*)    All other components within normal limits  ETHANOL  URINE DRUG SCREEN, QUALITATIVE ([facility] ONLY)     Procedures  ED ECG REPORT I, [place] Darcell Yacoub, the attending physician, personally viewed and interpreted this ECG.   Date: 02/17/2020  EKG Time: 1:53 PM  Rate: 88  Rhythm: Normal sinus rhythm  Axis: Normal  Intervals: Normal  ST&T Change: None  ____________________________________________   INITIAL IMPRESSION / MDM / ASSESSMENT AND PLAN / ED COURSE  As part of my medical decision making, I reviewed the following data within the electronic MEDICAL RECORD NUMBER   69 year old male with above-stated history and physical exam differential diagnosis including but not limited to dementia with behavioral disturbance versus schizophrenia.  Awaiting psychiatry consultation disposition.  Do ask due to escalating aggression and threatening behavior towards the staff the patient was chemically sedated with Haldol 5 mg Ativan 2 mg and Benadryl 50 mg.   4:00 AM patient is awake again  verbally abusive and threatening staff.   ____________________________________________  FINAL CLINICAL IMPRESSION(S) / ED DIAGNOSES  Final diagnoses:  Schizophrenia, unspecified type ([place])     MEDICATIONS GIVEN DURING THIS VISIT:  Medications  haloperidol lactate (HALDOL) injection 5 mg (5 mg Intramuscular Given 02/17/20 0209)  diphenhydrAMINE (BENADRYL) injection 50 mg (50 mg Intramuscular Given 02/17/20 0209)  LORazepam (ATIVAN) injection 2 mg (2 mg Intramuscular Given 02/17/20 0209)     ED Discharge Orders    None      *Please note:  KAEGAN HETTICH was evaluated in Emergency Department on 02/17/2020 for the symptoms described in the history of present illness. He was evaluated in the context of the global COVID-19 pandemic, which necessitated consideration that the patient might be at risk for infection with the SARS-CoV-2 virus that causes COVID-19. Institutional protocols and algorithms that pertain to the evaluation of patients at risk for COVID-19 are in a state of rapid change based on information released by regulatory bodies including the CDC and federal and state organizations. These policies and algorithms were followed during the patient's care in the ED.  Some ED evaluations and interventions may be delayed as a result of limited staffing during and after the pandemic.*  Note:  This document was prepared using Dragon voice recognition software and may include unintentional dictation errors.   Gregor Hams, MD 02/17/20 0230    Gregor Hams, MD 02/17/20 8016    Gregor Hams, MD 02/26/20 [phone]

## 2020-02-17 NOTE — ED Notes (Signed)
Pt had bowel movement that soiled sheets, gown, brief, and chux. Patient cleaned by this RN and Alissa tech. Pt brief changed, sheets changed, gown changed, and patient given bed bath by this RN and Corporate treasurer. Pt clean at this time, patient given clean warm blankets and appears more comfortable in bed at this time. Pt given coke per request, no further needs noted. Will continue to monitor.

## 2020-02-17 NOTE — ED Notes (Signed)
Dr. Owens Shark at the bedside for pt evaluation. Pt alert and calm at this time. No distress noted.

## 2020-02-17 NOTE — BH Assessment (Signed)
Assessment Note  Scott [name] is an 69 y.o. male who presents to the ER due to his care home, [facility] [place] [phone]), was unable to manage his behaviors. Per the staff, he is yelling and hitting staff and other residents. This happens mainly in the evening. They are currently unable to keep the other residents safe. Staff further reports, they have tried multiple things but have been unsuccessful. They have made changes with his medications but he does well for two to three days and return to the previous behaviors.  During the interview, the patient was pleasant and was able to answer majority of the questions. However, he was mainly tangential and unorganized thoughts. He was fixated on getting his sweater and fifteen dollars returned. While in the ER, he hasnt threatened staff nor made any gestures to harm anyone.  Patient guardian/[place] ([phone])   Diagnosis: Schizophrenia  Past Medical History:  Past Medical History:  Diagnosis Date   Aggression aggravated 08/20/2018   Anxiety    Chronic hyponatremia 05/2013, 11/2013   Dementia ([place])    Dyslipidemia    GERD (gastroesophageal reflux disease)    Hypertension    Hypoglycemia    Schizophrenia ([place])    Seizures ([place])    Syncope    Tardive dyskinesia    Urinary tract infection    Vitamin D deficiency     Past Surgical History:  Procedure Laterality Date   CATARACT EXTRACTION W/PHACO Left 01/10/2016   Procedure: CATARACT EXTRACTION PHACO AND INTRAOCULAR LENS PLACEMENT ([place]);  Surgeon: Birder Robson, MD;  Location: [facility] ORS;  Service: Ophthalmology;  Laterality: Left;  [place] 00:53 AP% 24.2 CDE 12.88 fluid pack lot # [id number]   EYE SURGERY     FOOT SURGERY      Family History: History reviewed. No pertinent family history.  Social History:  reports that he has been smoking cigarettes. He has been smoking about 0.50 packs per day. He has never used smokeless  tobacco. He reports that he does not drink alcohol and does not use drugs.  Additional Social History:  Alcohol / Drug Use Pain Medications: See PTA Prescriptions: See PTA Over the Counter: See PTA History of alcohol / drug use?: No history of alcohol / drug abuse Longest period of sobriety (when/how long): n/a  CIWA: CIWA-Ar BP: 128/90 Pulse Rate: 82 COWS:    Allergies: No Known Allergies  Home Medications: (Not in a hospital admission)   OB/GYN Status:  No LMP for male patient.  General Assessment Data Location of Assessment: [facility] ED TTS Assessment: In system Is this a Tele or Face-to-Face Assessment?: Face-to-Face Is this an Initial Assessment or a Re-assessment for this encounter?: Initial Assessment Patient Accompanied by:: N/A Language Other than English: No Living Arrangements: In Assisted Living/Nursing Home (Comment: Name of [facility]) What gender do you identify as?: Male Date Telepsych consult ordered in [facility]: 02/17/20 Time Telepsych consult ordered in [facility]: 0255 Marital status: Single Pregnancy Status: No Living Arrangements: Other (Comment) Can pt return to current living arrangement?: Yes Admission Status: Involuntary Petitioner: Other Is patient capable of signing voluntary admission?: No (Under IVC) Referral Source: Self/Family/Friend Insurance type: Medicare A&B  Medical Screening Exam ([place]) Medical Exam completed: Yes  Crisis Care Plan Living Arrangements: Other (Comment) Legal Guardian: Other: ([place]) Name of Psychiatrist: [facility]  Education Status Is patient currently in school?: No Is the patient employed, unemployed or receiving disability?: Receiving disability income, Unemployed  Risk  to self with the past 6 months Suicidal Ideation: No Has patient been a risk to self within the past 6 months prior to admission? : No Suicidal Intent: No Has patient had any suicidal intent within the past  6 months prior to admission? : No Is patient at risk for suicide?: No Suicidal Plan?: No Has patient had any suicidal plan within the past 6 months prior to admission? : No Access to Means: No What has been your use of drugs/alcohol within the last 12 months?: None reported Previous Attempts/Gestures: No How many times?: 0 Other Self Harm Risks: Reports of none Triggers for Past Attempts: None known Intentional Self Injurious Behavior: None Family Suicide History: Unknown Recent stressful life event(s): Other (Comment) Persecutory voices/beliefs?: No Depression: No Depression Symptoms: Feeling angry/irritable Substance abuse history and/or treatment for substance abuse?: No Suicide prevention information given to non-admitted patients: Not applicable  Risk to Others within the past 6 months Homicidal Ideation: No Does patient have any lifetime risk of violence toward others beyond the six months prior to admission? : Unknown Thoughts of Harm to Others: No Current Homicidal Intent: No Current Homicidal Plan: No Access to Homicidal Means: No Identified Victim: Reports of none History of harm to others?: Yes Assessment of Violence: In distant past Violent Behavior Description: Towards staff and other residents at the home Does patient have access to weapons?: No Criminal Charges Pending?: No Does patient have a court date: No Is patient on probation?: No  Psychosis Hallucinations: None noted Delusions: None noted  Mental Status Report Appearance/Hygiene: In hospital gown, Unremarkable Eye Contact: Poor Motor Activity: Freedom of movement Speech: Pressured, Loud Level of Consciousness: Alert Mood: Preoccupied, Pleasant Affect: Appropriate to circumstance Anxiety Level: Minimal Thought Processes: Irrelevant, Tangential Judgement: Partial Orientation: Person, Place, Appropriate for developmental age Obsessive Compulsive Thoughts/Behaviors: Unable to Assess  Cognitive  Functioning Concentration: Unable to Assess Memory: Unable to Assess Is patient IDD: No Insight: Unable to Assess Impulse Control: Poor Appetite: Good Have you had any weight changes? : No Change Sleep: Unable to Assess Vegetative Symptoms: None  ADLScreening [facility] Assessment Services) Patient's cognitive ability adequate to safely complete daily activities?: Yes Patient able to express need for assistance with ADLs?: Yes Independently performs ADLs?: Yes (appropriate for developmental age)  Prior Inpatient Therapy Prior Inpatient Therapy: Yes Prior Therapy Dates: 01/2016 & 11/2013 Prior Therapy Facilty/Provider(s): [facility] Reason for Treatment: Schizophrenia  Prior Outpatient Therapy Prior Outpatient Therapy: Yes Prior Therapy Dates: Current Prior Therapy Facilty/Provider(s): Via care home Reason for Treatment: Schizophrenia Does patient have an ACCT team?: No Does patient have Intensive In-House Services?  : No Does patient have [facility] services? : No Does patient have P4CC services?: No  ADL Screening (condition at time of admission) Patient's cognitive ability adequate to safely complete daily activities?: Yes Is the patient deaf or have difficulty hearing?: No Does the patient have difficulty seeing, even when wearing glasses/contacts?: No Does the patient have difficulty concentrating, remembering, or making decisions?: No Patient able to express need for assistance with ADLs?: Yes Does the patient have difficulty dressing or bathing?: No Independently performs ADLs?: Yes (appropriate for developmental age) Does the patient have difficulty walking or climbing stairs?: No Weakness of Legs: None Weakness of Arms/Hands: None  Home Assistive Devices/Equipment Home Assistive Devices/Equipment: None  Therapy Consults (therapy consults require a physician order) PT Evaluation Needed: No OT Evalulation Needed: No SLP Evaluation Needed:  No Abuse/Neglect Assessment (Assessment to be complete while patient is alone)  Abuse/Neglect Assessment Can Be Completed: Yes Physical Abuse: Denies Verbal Abuse: Denies Sexual Abuse: Denies Exploitation of patient/patient's resources: Denies Self-Neglect: Denies Values / Beliefs Cultural Requests During Hospitalization: None Spiritual Requests During Hospitalization: None Consults Spiritual Care Consult Needed: No Transition of Care Team Consult Needed: No Advance Directives (For Healthcare) Does Patient Have a Medical Advance Directive?: No   Disposition:  Disposition Initial Assessment Completed for this Encounter: Yes  On Site Evaluation by:   Reviewed with Physician:    Gunnar Fusi MS, LCAS, [facility], [place] Therapeutic Triage Specialist 02/17/2020 3:32 PM

## 2020-02-17 NOTE — BH Assessment (Signed)
This writer was unable to assess pt due to pt's significant agitation that resulted in an IM injection. Pt is currently unable to participate in an interview at this time.

## 2020-02-17 NOTE — ED Notes (Signed)
Pt. Was given his dinner tray with a drink.

## 2020-02-17 NOTE — ED Notes (Signed)
Pt resting quietly on stretcher with lights off to enhance rest. No acute distress noted at this time.

## 2020-02-17 NOTE — BH Assessment (Signed)
Referral information for Psychiatric Hospitalization faxed to;    [facility] ([phone]- [phone]),    [facility] [phone]) [phone]   [facility] [phone]),   [facility] [phone], [phone], [phone] or [phone]),    [facility] [phone]),    [facility] [phone] or [phone])   [facility] [phone] or [phone]),    [name] [phone]).

## 2020-02-17 NOTE — BH Assessment (Addendum)
Referral check for Psychiatric Hospitalization:     [facility] ([phone]- [phone]), Per Dian Situ, pt was declined due to having group home living arrangement.    [facility] [phone] -[phone]) [phone] 10:31 PM pt denied due to being a fall risk; inability to complete ADLs.    [facility] ([phone]), 10:01 No answer  -5:06 AM no answer    [name] [phone], [phone], [phone] or [phone]), 10:15 PM pt is under review. Staff agreed to call back.    [facility] [phone]), 10:12 No answer -5:13 AM Wyatt Portela reported that referrals had not been reviewed. Agreed to call back with results of referrals.     [facility] [phone] or [phone]) Intake staff asked for a refax. Task completed at 9:15 PM.   -5:21 AM No answer    [place] [phone] or [phone]), 9:59 PM No answer -5:24 Per Sherron Monday referrals have yet to be reviewed. Lauren advised this Probation officer to call back after 7am     [name] [phone]) 10:05 Left a message with a call back number.

## 2020-02-17 NOTE — ED Notes (Signed)
assumed care of patient patient confused and non compliant this morning. Patient coming from group home, sent to ed via police escort IVC for behavioral problems and aggression. Awaiting spych eval this morning. Safety maintained will monitor.

## 2020-02-18 DIAGNOSIS — F209 Schizophrenia, unspecified: Secondary | ICD-10-CM | POA: Diagnosis not present

## 2020-02-18 MED ORDER — LORAZEPAM 2 MG PO TABS
2.0000 mg | ORAL_TABLET | Freq: Once | ORAL | Status: AC
  Administered 2020-02-18: 2 mg via ORAL
  Filled 2020-02-18: qty 1

## 2020-02-18 MED ORDER — ZIPRASIDONE MESYLATE 20 MG IM SOLR
10.0000 mg | Freq: Once | INTRAMUSCULAR | Status: AC
  Administered 2020-02-18: 10 mg via INTRAMUSCULAR
  Filled 2020-02-18: qty 20

## 2020-02-18 NOTE — BH Assessment (Signed)
Writer spoke with the patient to complete an updated/reassessment. Patient continues to be tangential and rapid speech. He was able to be redirected.

## 2020-02-18 NOTE — ED Notes (Signed)
Gave pt food tray with juice. 

## 2020-02-18 NOTE — ED Provider Notes (Signed)
Emergency Medicine Observation Re-evaluation Note  Scott [name] is a 69 y.o. male, seen on rounds today.  Pt initially presented to the ED for complaints of Psychiatric Evaluation Currently, the patient is laying back in bed and denies any complaints.  Physical Exam  BP 136/77   Pulse 97   Temp 98.6 F (37 C) (Oral)   Resp 18   Ht 6\' 4"  (1.93 m)   Wt 90.7 kg   SpO2 100%   BMI 24.34 kg/m  Physical Exam Constitutional: Resting comfortably. Eyes: Conjunctivae are normal. Head: Atraumatic. Nose: No congestion/rhinnorhea. Mouth/Throat: Mucous membranes are moist. Neck: Normal ROM Cardiovascular: No cyanosis noted. Respiratory: Normal respiratory effort. Gastrointestinal: Non-distended. Genitourinary: deferred Musculoskeletal: No lower extremity tenderness nor edema. Neurologic:  Normal speech and language. No gross focal neurologic deficits are appreciated. Skin:  Skin is warm, dry and intact. No rash noted.    ED Course / MDM  EKG:EKG Interpretation  Date/Time:  Wednesday February 17 2020 13:53:36 EDT Ventricular Rate:  88 PR Interval:  156 QRS Duration: 82 QT Interval:  340 QTC Calculation: 411 R Axis:   29 Text Interpretation: Normal sinus rhythm Normal ECG Confirmed by UNCONFIRMED, DOCTOR ([id number]), editor Mel Almond, Tammy [phone]) on 02/17/2020 3:59:41 PM    I have reviewed the labs performed to date as well as medications administered while in observation.  Recent changes in the last 24 hours include none.  Plan  Current plan is for psychiatric admission, bed search ongoing. Patient is under full IVC at this time.   Blake Divine, MD 02/18/20 [phone]

## 2020-02-18 NOTE — ED Notes (Signed)
Pt brief changed at this time. Pt brief clean/dry and double briefed, patient comfortable in bed, no further needs noted at this time.

## 2020-02-18 NOTE — ED Notes (Signed)
Pt changed at this time, two new briefs placed on patient. Patient given two warm clean blankets, no further needs noted. Pt comfortable in bed.

## 2020-02-18 NOTE — ED Notes (Signed)
Pt given meal tray.

## 2020-02-18 NOTE — ED Notes (Signed)
Pt yelling from room. This RN at bedside. Pt's brief was on the floor. Pt cleaned and two new briefs in place.

## 2020-02-18 NOTE — ED Notes (Signed)
Pt resting at this time, stretcher locked in lowest position

## 2020-02-18 NOTE — ED Notes (Signed)
INVOLUNTARY awaiting Psych consult

## 2020-02-18 NOTE — ED Notes (Signed)
Pt asking staff to change brief d/t it being soiled.This Probation officer and RN Claiborne Billings checked pt and pt remains dry at this time. Pt continues to wear two briefs placed by the previous shift. Pt expresses no further needs at this time

## 2020-02-18 NOTE — BH Assessment (Signed)
Referral check for Psychiatric Hospitalization:   Scott [name] (Jamie-[phone]-or- [phone]), declined    Scott [name] [phone]- [phone]) 10:31 PM pt denied due to being a fall risk; inability to complete ADLs.   Scott [name] (QDIY-[phone]---[phone]), Advised to refax it.   Scott [name] [phone], [phone], [phone] or [phone]), unable to reach anyone.   Scott [name] (XHFSF-[phone]), Denied due to dementia.   Scott [name] (Andre-[phone] or [phone]), pending review.   Scott [name] (Ashley-[phone] or [phone]), Pending review    Scott [name] ([phone]), Denied due to dementia diagnoses.

## 2020-02-18 NOTE — ED Notes (Signed)
Pt continues to yell from room and his heard at nurses station. When this RN at bedside pt continues to yell with incomprehensible speech. MD made aware.

## 2020-02-18 NOTE — ED Notes (Addendum)
This RN and Colletta Maryland, EDT at bedside. Pt with soiled brief. Pt cleaned and two clean briefs placed on pt. Pt denies any further needs at this time.

## 2020-02-19 DIAGNOSIS — F209 Schizophrenia, unspecified: Secondary | ICD-10-CM | POA: Diagnosis not present

## 2020-02-19 MED ORDER — FAMOTIDINE 20 MG PO TABS
20.0000 mg | ORAL_TABLET | Freq: Every day | ORAL | Status: DC
  Administered 2020-02-19 – 2020-02-22 (×4): 20 mg via ORAL
  Filled 2020-02-19 (×4): qty 1

## 2020-02-19 MED ORDER — HALOPERIDOL 5 MG PO TABS
5.0000 mg | ORAL_TABLET | Freq: Every day | ORAL | Status: DC
  Administered 2020-02-19 – 2020-02-21 (×3): 5 mg via ORAL
  Filled 2020-02-19 (×3): qty 1

## 2020-02-19 MED ORDER — POLYSACCHARIDE IRON COMPLEX 150 MG PO CAPS
150.0000 mg | ORAL_CAPSULE | Freq: Every day | ORAL | Status: DC
  Administered 2020-02-19 – 2020-02-22 (×4): 150 mg via ORAL
  Filled 2020-02-19 (×4): qty 1

## 2020-02-19 MED ORDER — QUETIAPINE FUMARATE 25 MG PO TABS: 100.0000 mg | ORAL_TABLET | Freq: Every day | ORAL | Status: DC

## 2020-02-19 MED ORDER — METFORMIN HCL 500 MG PO TABS
1000.0000 mg | ORAL_TABLET | Freq: Two times a day (BID) | ORAL | Status: DC
  Administered 2020-02-19 – 2020-02-22 (×6): 1000 mg via ORAL
  Filled 2020-02-19 (×6): qty 2

## 2020-02-19 MED ORDER — TAMSULOSIN HCL 0.4 MG PO CAPS
0.4000 mg | ORAL_CAPSULE | Freq: Every evening | ORAL | Status: DC
  Administered 2020-02-20 – 2020-02-21 (×2): 0.4 mg via ORAL
  Filled 2020-02-19 (×2): qty 1

## 2020-02-19 MED ORDER — TRAZODONE HCL 100 MG PO TABS: 100.0000 mg | ORAL_TABLET | Freq: Every day | ORAL | Status: DC

## 2020-02-19 MED ORDER — OLANZAPINE 5 MG PO TBDP
20.0000 mg | ORAL_TABLET | Freq: Every day | ORAL | Status: DC
  Administered 2020-02-19 – 2020-02-21 (×3): 20 mg via ORAL
  Filled 2020-02-19 (×3): qty 4

## 2020-02-19 MED ORDER — HALOPERIDOL DECANOATE 100 MG/ML IM SOLN
75.0000 mg | Freq: Once | INTRAMUSCULAR | Status: AC
  Administered 2020-02-19: 75 mg via INTRAMUSCULAR
  Filled 2020-02-19: qty 0.75

## 2020-02-19 MED ORDER — BENZTROPINE MESYLATE 1 MG PO TABS
0.5000 mg | ORAL_TABLET | Freq: Two times a day (BID) | ORAL | Status: DC
  Administered 2020-02-19: 0.5 mg via ORAL
  Filled 2020-02-19: qty 1

## 2020-02-19 MED ORDER — BENZTROPINE MESYLATE 1 MG PO TABS
1.0000 mg | ORAL_TABLET | Freq: Two times a day (BID) | ORAL | Status: DC
  Administered 2020-02-19 – 2020-02-22 (×7): 1 mg via ORAL
  Filled 2020-02-19 (×7): qty 1

## 2020-02-19 MED ORDER — OLANZAPINE 5 MG PO TABS
5.0000 mg | ORAL_TABLET | Freq: Every morning | ORAL | Status: DC
  Administered 2020-02-19 – 2020-02-21 (×3): 5 mg via ORAL
  Filled 2020-02-19 (×2): qty 1

## 2020-02-19 MED ORDER — DIVALPROEX SODIUM ER 250 MG PO TB24
750.0000 mg | ORAL_TABLET | Freq: Every day | ORAL | Status: DC
  Administered 2020-02-19 – 2020-02-21 (×3): 750 mg via ORAL
  Filled 2020-02-19 (×3): qty 3

## 2020-02-19 MED ORDER — CLONAZEPAM 0.5 MG PO TABS
1.0000 mg | ORAL_TABLET | Freq: Three times a day (TID) | ORAL | Status: DC
  Administered 2020-02-19 – 2020-02-21 (×7): 1 mg via ORAL
  Filled 2020-02-19 (×7): qty 2

## 2020-02-19 MED ORDER — SENNOSIDES-DOCUSATE SODIUM 8.6-50 MG PO TABS
2.0000 | ORAL_TABLET | Freq: Two times a day (BID) | ORAL | Status: DC
  Administered 2020-02-19 – 2020-02-22 (×7): 2 via ORAL
  Filled 2020-02-19 (×7): qty 2

## 2020-02-19 MED ORDER — LORATADINE 10 MG PO TABS
10.0000 mg | ORAL_TABLET | Freq: Every day | ORAL | Status: DC
  Administered 2020-02-19 – 2020-02-22 (×4): 10 mg via ORAL
  Filled 2020-02-19 (×4): qty 1

## 2020-02-19 MED ORDER — ATORVASTATIN CALCIUM 20 MG PO TABS
40.0000 mg | ORAL_TABLET | Freq: Every day | ORAL | Status: DC
  Administered 2020-02-19 – 2020-02-22 (×4): 40 mg via ORAL
  Filled 2020-02-19 (×4): qty 2

## 2020-02-19 MED ORDER — QUETIAPINE FUMARATE 25 MG PO TABS: 25.0000 mg | ORAL_TABLET | ORAL | Status: DC

## 2020-02-19 MED ORDER — TRAZODONE HCL 50 MG PO TABS
150.0000 mg | ORAL_TABLET | Freq: Every day | ORAL | Status: DC
  Administered 2020-02-19 – 2020-02-21 (×3): 150 mg via ORAL
  Filled 2020-02-19 (×3): qty 1

## 2020-02-19 MED ORDER — HALOPERIDOL 5 MG PO TABS
2.5000 mg | ORAL_TABLET | Freq: Every morning | ORAL | Status: DC
  Administered 2020-02-19 – 2020-02-21 (×3): 2.5 mg via ORAL
  Filled 2020-02-19 (×3): qty 1

## 2020-02-19 MED ORDER — VITAMIN D (ERGOCALCIFEROL) 1.25 MG (50000 UNIT) PO CAPS
50000.0000 [IU] | ORAL_CAPSULE | ORAL | Status: DC
  Administered 2020-02-19: 50000 [IU] via ORAL
  Filled 2020-02-19: qty 1

## 2020-02-19 NOTE — ED Notes (Signed)
Pt has continued to be fixated on repositioning blankets over self. Pt has continuously placed and removed and replaced blankets since coming to room

## 2020-02-19 NOTE — ED Notes (Signed)
Pt continues to lie in bed, still fidgeting

## 2020-02-19 NOTE — ED Notes (Signed)
Pt in room, banging on door. Pt had gotten out of bed, urinated on bed after standing up and walked to door and was beating on it. Pt cleaned and returned to position of comfort by this nurse and Alissa, Tech at this time, clean sheets on bed. Pt repeatedly speaks of wanting watch and clothes from "down stairs". Pt now holding conversation with self in room by himself

## 2020-02-19 NOTE — ED Notes (Signed)
Pt in bed playing with blankets. Pillow adjusted. PT denies needs.

## 2020-02-19 NOTE — ED Notes (Signed)
Hourly rounding reveals patient awake in room. No complaints, stable, in no acute distress. Q15 minute rounds and monitoring via Rover and Officer to continue.  

## 2020-02-19 NOTE — ED Notes (Signed)
Pt changed by this rn and [employer].

## 2020-02-19 NOTE — ED Notes (Signed)
Hourly rounding reveals patient awake in room and continues to move and reposition blanket. No complaints, stable, in no acute distress. Q15 minute rounds and monitoring via Engineer, drilling to continue.

## 2020-02-19 NOTE — BH Assessment (Addendum)
Writer spoke with the patient to complete an updated/reassessment. Patient continues to be tangential and able to but redirected.  Writer received phone call from patient's guardian, with [place] [name]). Updated her on patient's disposition. She provided another number for the "Weekend on-call Social worker," in the event they need to be updated and/or the patient transfer to another facility for inpatient.  Weekend On Call Number [phone]) and staff member will be Sarita Bottom.

## 2020-02-19 NOTE — ED Notes (Signed)
Pt loud - demanding to go home now, demanding I call the [facility] now. Not making threats but trying to be aggressive by talking over people and talking loud. No physical aggression. NT able to redirect at this time for short periods.

## 2020-02-19 NOTE — ED Notes (Signed)
Washed and changed patient linen and bed sheets.AS

## 2020-02-19 NOTE — Progress Notes (Signed)
[facility] MD Progress Note  02/19/2020 12:21 PM Scott [name]  MRN:  [id number] Subjective:   Scott [name] and screams  Principal Problem: <principal problem not specified> Diagnosis: Active Problems:   * No active hospital problems. *  Total Time spent with patient:   30-40    Past Psychiatric History: Periodic ---med mgt   Past Medical History:  Past Medical History:  Diagnosis Date  . Aggression aggravated 08/20/2018  . Anxiety   . Chronic hyponatremia 05/2013, 11/2013  . Dementia ([place])   . Dyslipidemia   . GERD (gastroesophageal reflux disease)   . Hypertension   . Hypoglycemia   . Schizophrenia ([place])   . Seizures ([place])   . Syncope   . Tardive dyskinesia   . Urinary tract infection   . Vitamin D deficiency     Past Surgical History:  Procedure Laterality Date  . CATARACT EXTRACTION W/PHACO Left 01/10/2016   Procedure: CATARACT EXTRACTION PHACO AND INTRAOCULAR LENS PLACEMENT (IOC);  Surgeon: Birder Robson, MD;  Location: [facility] ORS;  Service: Ophthalmology;  Laterality: Left;  [place] 00:53 AP% 24.2 CDE 12.88 fluid pack lot # [id number]  . EYE SURGERY    . FOOT SURGERY     Family History: History reviewed. No pertinent family history. Family Psychiatric  History:     Not known ] He lives in [place] guardian does not share  Social History:  Social History   Substance and Sexual Activity  Alcohol Use No     Social History   Substance and Sexual Activity  Drug Use No    Social History   Socioeconomic History  . Marital status: Single    Spouse name: Not on file  . Number of children: Not on file  . Years of education: Not on file  . Highest education level: Not on file  Occupational History  . Not on file  Tobacco Use  . Smoking status: Current Every Day Smoker    Packs/day: 0.50    Types: Cigarettes  . Smokeless tobacco: Never Used  Vaping Use  . Vaping Use: Never used  Substance and Sexual Activity  . Alcohol use: No  . Drug use: No  . Sexual activity: Not  Currently  Other Topics Concern  . Not on file  Social History Narrative  . Not on file   Social Determinants of Health   Financial Resource Strain:   . Difficulty of Paying Living Expenses: Not on file  Food Insecurity:   . Worried About Charity fundraiser in the Last Year: Not on file  . Ran Out of Food in the Last Year: Not on file  Transportation Needs:   . Lack of Transportation (Medical): Not on file  . Lack of Transportation (Non-Medical): Not on file  Physical Activity:   . Days of Exercise per Week: Not on file  . Minutes of Exercise per Session: Not on file  Stress:   . Feeling of Stress : Not on file  Social Connections:   . Frequency of Communication with Friends and Family: Not on file  . Frequency of Social Gatherings with Friends and Family: Not on file  . Attends Religious Services: Not on file  . Active Member of Clubs or Organizations: Not on file  . Attends Archivist Meetings: Not on file  . Marital Status: Not on file   Additional Social History:    Pain Medications: See PTA Prescriptions: See PTA Over the Counter: See PTA History of alcohol / drug use?:  No history of alcohol / drug abuse Longest period of sobriety (when/how long): n/a        Guardian is in charge we have touched based with her and have modified his meds       He is also due for an injectable       Sleep:  Erratic at times   Appetite:   Fair to poor   But he is morbidly obese  Current Medications: Current Facility-Administered Medications  Medication Dose Route Frequency Provider Last Rate Last Admin  . atorvastatin (LIPITOR) tablet 40 mg  40 mg Oral Daily Eulas Post, MD      . benztropine (COGENTIN) tablet 0.5 mg  0.5 mg Oral BID Eulas Post, MD      . benztropine (COGENTIN) tablet 1 mg  1 mg Oral BID Eulas Post, MD      . clonazePAM [facility] [place]) tablet 1 mg  1 mg Oral TID Eulas Post, MD      . famotidine (PEPCID) tablet 20 mg  20 mg Oral  Daily Eulas Post, MD      . haloperidol (HALDOL) tablet 2.5 mg  2.5 mg Oral q AM Eulas Post, MD      . haloperidol (HALDOL) tablet 5 mg  5 mg Oral QHS Eulas Post, MD      . haloperidol decanoate (HALDOL DECANOATE) 100 MG/ML injection 75 mg  75 mg Intramuscular Once Eulas Post, MD      . iron polysaccharides (NIFEREX) capsule 150 mg  150 mg Oral Daily Eulas Post, MD      . loratadine (CLARITIN) tablet 10 mg  10 mg Oral Daily Eulas Post, MD      . metFORMIN (GLUCOPHAGE) tablet 1,000 mg  1,000 mg Oral BID Eulas Post, MD      . OLANZapine [facility]) tablet 5 mg  5 mg Oral q AM Eulas Post, MD      . OLANZapine zydis (ZYPREXA) disintegrating tablet 20 mg  20 mg Oral QHS Eulas Post, MD      . senna-docusate (Senokot-S) tablet 2 tablet  2 tablet Oral BID Eulas Post, MD      . tamsulosin [facility]) capsule 0.4 mg  0.4 mg Oral QPM Eulas Post, MD      . traZODone (DESYREL) tablet 150 mg  150 mg Oral QHS Eulas Post, MD      . Vitamin D (Ergocalciferol) (DRISDOL) capsule 50,000 Units  50,000 Units Oral Q7 days Eulas Post, MD       Current Outpatient Medications  Medication Sig Dispense Refill  . atorvastatin (LIPITOR) 40 MG tablet Take 40 mg by mouth daily.    . benztropine (COGENTIN) 0.5 MG tablet Take 0.5 mg by mouth 2 (two) times daily.    . cetirizine (ZYRTEC) 10 MG tablet Take 10 mg by mouth daily.    . famotidine (PEPCID) 20 MG tablet Take 20 mg by mouth daily.    . metFORMIN (GLUCOPHAGE) 1000 MG tablet Take 1,000 mg by mouth 2 (two) times daily.    . Iron Combinations (NIFEREX) TABS Take 1 tablet by mouth daily.    Marland Kitchen NOVOLOG 100 UNIT/ML injection Inject into the skin.    . Polyethylene Glycol 3350 (PEG 3350) POWD Take 17 g by mouth daily as needed.     Marland Kitchen QUEtiapine (SEROQUEL) 100 MG tablet Take 100 mg by mouth at bedtime.     Marland Kitchen QUEtiapine (SEROQUEL) 50 MG tablet Take 25 mg by mouth every morning.     . senna-docusate  (  SENOKOT-S) 8.6-50 MG tablet Take 1 tablet by mouth daily as needed.     . tamsulosin (FLOMAX) 0.4 MG CAPS capsule Take 0.4 mg by mouth every evening.     . traZODone (DESYREL) 100 MG tablet Take 100 mg by mouth at bedtime.     . Vitamin D, Ergocalciferol, (DRISDOL) 50000 units CAPS capsule Take 50,000 Units by mouth every 7 (seven) days.      Lab Results: No results found for this or any previous visit (from the past 48 hour(s)).  Blood Alcohol level:  Lab Results  Component Value Date   ETH <10 02/16/2020   ETH <10 00/45/9977    Metabolic Disorder Labs: Lab Results  Component Value Date   HGBA1C 5.5 10/14/2017   MPG 111.15 10/14/2017   No results found for: PROLACTIN Lab Results  Component Value Date   CHOL 134 05/26/2013   TRIG 96 05/26/2013   HDL 41 05/26/2013   VLDL 19 05/26/2013   LDLCALC 74 05/26/2013    Physical Findings: AIMS:  , ,  ,  ,    CIWA:    COWS:     Musculoskeletal: Strength & Muscle Tone:  Not clear at this point  Lying in bed shouting and y elling at times  [place]:  Cannot assess  Patient leans: na   Psychiatric Specialty Exam: Physical Exam  Review of Systems  Blood pressure 140/88, pulse (!) 52, temperature 98.2 F (36.8 C), temperature source Oral, resp. rate 16, height 6\' 4"  (1.93 m), weight 90.7 kg, SpO2 98 %.Body mass index is 24.34 kg/m.                                                        MS limited due to dementia  He is yelling and speaking to Self  Alert at present   Treatment Plan Summary:  History of dementia and Chronic Schizophrenia ---now with severe yelling and outbursts.  He is due for Haldol D injection given today but also   Bid Zyprexa/ Haldol Cogentin started Along with TiD regular klonopin for management  Depakote restarted as Depakote ER 750 qhs with check on depakote level as well.    HS trazodone increased to 150 at night  Doses have been made regular unless sedated    Have discussed with ER MD        Eulas Post, MD 02/19/2020, 12:21 PM

## 2020-02-19 NOTE — ED Notes (Signed)
Pt continues to be restless in bed, continuously pulling and throwing blanket over self. Pt denies needs

## 2020-02-19 NOTE — ED Provider Notes (Signed)
Emergency Medicine Observation Re-evaluation Note  Scott [name] is a 69 y.o. male, seen on rounds today.  Pt initially presented to the ED for complaints of Psychiatric Evaluation Currently, the patient is talking to himself in the room.  Physical Exam  BP (!) 144/93 (BP Location: Right Arm)   Pulse (!) 104   Temp 97.8 F (36.6 C) (Oral)   Resp 20   Ht 6\' 4"  (1.93 m)   Wt 90.7 kg   SpO2 99%   BMI 24.34 kg/m  Physical Exam General: Sitting on the bed talking to himself Cardiac: No cyanosis Lungs: Equal rise and fall Psych: Slightly restless  ED Course / MDM  EKG:EKG Interpretation  Date/Time:  Wednesday February 17 2020 13:53:36 EDT Ventricular Rate:  88 PR Interval:  156 QRS Duration: 82 QT Interval:  340 QTC Calculation: 411 R Axis:   29 Text Interpretation: Normal sinus rhythm Normal ECG Confirmed by UNCONFIRMED, DOCTOR ([id number]), editor Mel Almond, Tammy [phone]) on 02/17/2020 3:59:41 PM    I have reviewed the labs performed to date as well as medications administered while in observation.  Recent changes in the last 24 hours include no events overnight.  Plan  Current plan is for psychiatric disposition. Patient is under full IVC at this time.   Paulette Blanch, MD 02/19/20 0600

## 2020-02-19 NOTE — ED Notes (Signed)
This tech and jess assisted the Scott [name] in changing his brief in the bed and his chucks. Warm blankets were provided.

## 2020-02-19 NOTE — BH Assessment (Signed)
Referralcheckfor Psychiatric Hospitalization:   Scott [name] (HYHO-[phone]---[phone]---[phone]), 4:08 AM No intake staff advised to call after [place] ([phone], [phone], [phone] or [phone]), 4:14 AM Per Narda Rutherford there is no intake staff availiable. advised to call after 8 AM.   [facility] (Andre-[phone] or [phone]), 4:17 AM No answer   [place] (Ashley-[phone] or [phone]), 4:19 AM Per Amy the intake nurse will be in at 7:30 AM.

## 2020-02-19 NOTE — ED Notes (Signed)
This nurse speaks to pt, asks pt if he has any needs at this time, pt denies. Pt continues to attempt to reposition blanket but refuses assistance. Pt lights dimmed in room and door closed, pt states he is not going to sleep.

## 2020-02-19 NOTE — ED Notes (Signed)
Hourly rounding reveals patient awake in room and restless. No complaints, stable, in no acute distress. Q15 minute rounds and monitoring via Engineer, drilling to continue.

## 2020-02-19 NOTE — ED Notes (Signed)
Pt. Transferred from room 26 to room. Report to include Situation, Background, Assessment and Recommendations from [place], [place]. Pt. Oriented to [place] including Q15 minute rounds as well as Engineer, drilling for their protection. Patient is warm and dry in no acute distress. Patient denies SI, HI, and AVH. Pt. Encouraged to let this nurse know if needs arise.

## 2020-02-20 DIAGNOSIS — F209 Schizophrenia, unspecified: Secondary | ICD-10-CM | POA: Diagnosis not present

## 2020-02-20 LAB — VALPROIC ACID LEVEL: Valproic Acid Lvl: 17 ug/mL — ABNORMAL LOW (ref 50.0–100.0)

## 2020-02-20 NOTE — BHH Counselor (Addendum)
This Probation officer attempted to contact [facility] of [place] regarding potential discharge of patient. [place]-- voicemail not set up, could not leave a message @ 11:38AM  Attempted to contact Gwynneth Aliment at [phone] not set up, could not leave a message @ 13:52

## 2020-02-20 NOTE — ED Notes (Signed)
Pt changed into new brief at this time. Peri-care performed by this RN. Pt repositioned in bed. Pt currently calm and cooperative at this time.

## 2020-02-20 NOTE — BH Assessment (Signed)
Referralcheckfor Psychiatric Hospitalization:   Rosana Hoes ([facility]-[phone]---[phone]---[phone]),OU intake staff available until morning   [name] [phone], [phone], [phone] or [phone]),No intake staff available until morning   [facility] (Andre-[phone] or [phone]), Denied due to not meeting acuity on 02/20/20   [place] ([place]-[phone] or [phone]), No staff available until morning

## 2020-02-20 NOTE — ED Provider Notes (Signed)
Emergency Medicine Observation Re-evaluation Note  Scott [name] is a 69 y.o. male, seen on rounds today.  Pt initially presented to the ED for complaints of Psychiatric Evaluation Currently, the patient is resting calmly.  Physical Exam  BP 133/83 (BP Location: Left Arm)   Pulse 98   Temp 98 F (36.7 C) (Oral)   Resp 18   Ht 6\' 4"  (1.93 m)   Wt 90.7 kg   SpO2 99%   BMI 24.34 kg/m  Physical Exam Vitals and nursing note reviewed.  HENT:     Head: Normocephalic and atraumatic.     Right Ear: External ear normal.     Left Ear: External ear normal.     Nose: Nose normal.  Cardiovascular:     Rate and Rhythm: Normal rate.     Pulses: Normal pulses.  Pulmonary:     Effort: No respiratory distress.  Abdominal:     Tenderness: There is no abdominal tenderness.  Neurological:     Mental Status: He is alert.      ED Course / MDM  EKG:EKG Interpretation  Date/Time:  Wednesday February 17 2020 13:53:36 EDT Ventricular Rate:  88 PR Interval:  156 QRS Duration: 82 QT Interval:  340 QTC Calculation: 411 R Axis:   29 Text Interpretation: Normal sinus rhythm Normal ECG Confirmed by UNCONFIRMED, DOCTOR ([id number]), editor Mel Almond, Tammy [phone]) on 02/17/2020 3:59:41 PM    I have reviewed the labs performed to date as well as medications administered while in observation.  Recent changes in the last 24 hours include none.  Plan  Current plan is for psychiatric disposition. Patient is under full IVC at this time.   Lucrezia Starch, MD 02/21/20 [phone]

## 2020-02-20 NOTE — ED Notes (Signed)
Patient given a snack tray and something to drink.

## 2020-02-20 NOTE — ED Provider Notes (Signed)
Emergency Medicine Observation Re-evaluation Note  Scott [name] is a 69 y.o. male, seen on rounds today.  Pt initially presented to the ED for complaints of Psychiatric Evaluation Currently, the patient is resting, voices no complaints.  Physical Exam  BP 140/88 (BP Location: Left Arm)   Pulse (!) 52   Temp 98.2 F (36.8 C) (Oral)   Resp 16   Ht 6\' 4"  (1.93 m)   Wt 90.7 kg   SpO2 98%   BMI 24.34 kg/m  Physical Exam General: Resting in no acute distress Cardiac: No cyanosis Lungs: Equal rise and fall Psych: No aggression  ED Course / MDM  EKG:EKG Interpretation  Date/Time:  Wednesday February 17 2020 13:53:36 EDT Ventricular Rate:  88 PR Interval:  156 QRS Duration: 82 QT Interval:  340 QTC Calculation: 411 R Axis:   29 Text Interpretation: Normal sinus rhythm Normal ECG Confirmed by UNCONFIRMED, DOCTOR ([id number]), editor Mel Almond, Tammy [phone]) on 02/17/2020 3:59:41 PM    I have reviewed the labs performed to date as well as medications administered while in observation.  Recent changes in the last 24 hours include no events overnight.  Plan  Current plan is for psychiatric disposition. Patient is under full IVC at this time.   Paulette Blanch, MD 02/20/20 [phone]

## 2020-02-21 DIAGNOSIS — F209 Schizophrenia, unspecified: Secondary | ICD-10-CM | POA: Diagnosis not present

## 2020-02-21 MED ORDER — ZOLPIDEM TARTRATE 5 MG PO TABS
5.0000 mg | ORAL_TABLET | Freq: Once | ORAL | Status: AC
  Administered 2020-02-21: 5 mg via ORAL
  Filled 2020-02-21: qty 1

## 2020-02-21 MED ORDER — CLONAZEPAM 0.5 MG PO TABS
0.5000 mg | ORAL_TABLET | Freq: Two times a day (BID) | ORAL | Status: DC
  Administered 2020-02-21 – 2020-02-22 (×2): 0.5 mg via ORAL
  Filled 2020-02-21 (×2): qty 1

## 2020-02-21 NOTE — Progress Notes (Addendum)
Patient ID: Scott [name], male   DOB: [birth year], 69 y.o.   MRN: [id number]   Psychiatry Brief note  Patient is relatively at baseline with addition of new and more regular Psych meds  Have not observed new issues or IED shouting spells, agitation, psychosis or other related problems   Today he is alert to name only but he is very strange and moutns and whispers words that cannot be understood  Yesterday's TTS attempted to call [name] to explain that new meds have him in better mode and to see if he can return  However she did not hear back   Of note there is no TTS today and it is not clear for the rest of the week  Attempted to engage but not getting very far  No other new side effects or medical problems reported  Am Haldol, and Zyprexa d/ced to avoid daytime sedation while staying calm  Klonopin reduced to 0.5 mg po bid as well.      Awaits return to NH or it will be long wait for another NH   [name] MD

## 2020-02-21 NOTE — ED Notes (Signed)
Pt had taken wet brief off an threw it on the floor. I put a clean brief on pt, cleaned up the trash in room, and wiped down bedside table.

## 2020-02-21 NOTE — ED Notes (Signed)
Patient yelling out. RN unable to verbally de-escalate patient. MD notified.

## 2020-02-21 NOTE — ED Notes (Signed)
Pt takes meds in applesauce with no issues. Pt dry at this time. Pt denies any further needs. Resting in bed with lights off.

## 2020-02-21 NOTE — ED Notes (Signed)
Changed pt top and blanket

## 2020-02-21 NOTE — ED Notes (Signed)
Gave pt dinner tray  

## 2020-02-21 NOTE — ED Notes (Signed)
Patient had episode of urinary incontinence. Patient cleaned and changed into clean brief.

## 2020-02-21 NOTE — ED Notes (Signed)
NT Alma Friendly found 3 partially dissolved pills on floor. RN unable to ascertain the type of medications due to dissolved state; MD notified.

## 2020-02-22 DIAGNOSIS — F209 Schizophrenia, unspecified: Secondary | ICD-10-CM | POA: Diagnosis not present

## 2020-02-22 MED ORDER — TRAZODONE HCL 150 MG PO TABS: 150.0000 mg | ORAL_TABLET | Freq: Every day | ORAL | 0 refills | Status: DC

## 2020-02-22 MED ORDER — BENZTROPINE MESYLATE 1 MG PO TABS: 1.0000 mg | ORAL_TABLET | Freq: Two times a day (BID) | ORAL | 0 refills | Status: DC

## 2020-02-22 MED ORDER — DIVALPROEX SODIUM ER 250 MG PO TB24: 750.0000 mg | ORAL_TABLET | Freq: Every day | ORAL | 0 refills | Status: AC

## 2020-02-22 MED ORDER — CLONAZEPAM 0.5 MG PO TABS: 0.5000 mg | ORAL_TABLET | Freq: Two times a day (BID) | ORAL | 0 refills | Status: DC

## 2020-02-22 MED ORDER — ZOLPIDEM TARTRATE 5 MG PO TABS
5.0000 mg | ORAL_TABLET | ORAL | Status: AC
  Administered 2020-02-22: 5 mg via ORAL
  Filled 2020-02-22: qty 1

## 2020-02-22 MED ORDER — OLANZAPINE 5 MG PO TABS: 5.0000 mg | ORAL_TABLET | Freq: Every morning | ORAL | 0 refills | Status: DC

## 2020-02-22 MED ORDER — OLANZAPINE 20 MG PO TBDP: 20.0000 mg | ORAL_TABLET | Freq: Every day | ORAL | 0 refills | Status: DC

## 2020-02-22 MED ORDER — HALOPERIDOL 5 MG PO TABS: 5.0000 mg | ORAL_TABLET | Freq: Every day | ORAL | 0 refills | Status: DC

## 2020-02-22 NOTE — Final Progress Note (Signed)
Physician Final Progress Note  Patient ID: Scott [name] MRN: [id number] DOB/AGE: 1951-04-11 69 y.o.  Admit date: 02/17/2020 Admitting provider: No admitting provider for patient encounter. Discharge date: 02/22/2020   Admission Diagnoses:   Dementia With behavioral Disturbance Schizoaffective Disorder      Discharge Diagnoses:  Active Problems:   * No active hospital problems. * \ Same   Consults:   TTS  SW   Psych MD  ER MD  Significant Findings/ Diagnostic Studies:  General ER care  Procedures:  None per se   Discharge Condition:  Fair   Disposition:  back to NH   Guardian approved and RN to call for Nursing Home   Diet: Regular diet  Discharge Activity: Activity as tolerated    Mental Status Approximately at baseline Very limited Oriented to person  Alert cooperative In bed with limited ROM  Rapport fair  Eye contact fair  He understands he is going back to group home at least   His thoughts are rambling and illogical  He is not having shouting spells  He received his Haldol D --here and that is part of why he is now calmed down    Leans  N/a ROM /gait station  Musculoskeletal ---about the same Recall poor Akathisia none Psychomotor no new change ADL's  Needs full care Assets --support NH Handedness not known       Total time spent taking care of this patient: 30 minutes or so  minutes  Signed: Eulas Post 02/22/2020, 10:05 AM

## 2020-02-22 NOTE — Discharge Instructions (Signed)
There were several changes to your medication regimen while at the hospital to better control your symptoms. New prescriptions have been provided for these changes.

## 2020-02-22 NOTE — ED Provider Notes (Signed)
Emergency Medicine Observation Re-evaluation Note  Scott [name] is a 69 y.o. male, seen on rounds today.  Pt initially presented to the ED for complaints of Psychiatric Evaluation Currently, the patient is calming down but having trouble sleeping.  Physical Exam  BP 128/77 (BP Location: Left Arm)   Pulse (!) 108   Temp 98.2 F (36.8 C) (Oral)   Resp 18   Ht 1.93 m (6\' 4" )   Wt 90.7 kg   SpO2 96%   BMI 24.34 kg/m  Physical Exam Gen:  No acute distress Resp:  Breathing easily and comfortably, no accessory muscle usage Neuro:  Moving all four extremities, no gross focal neuro deficits Psych:  Somewhat agitated but not dangerous.  Having trouble sleeping  ED Course / MDM  EKG:EKG Interpretation  Date/Time:  Wednesday February 17 2020 13:53:36 EDT Ventricular Rate:  88 PR Interval:  156 QRS Duration: 82 QT Interval:  340 QTC Calculation: 411 R Axis:   29 Text Interpretation: Normal sinus rhythm Normal ECG Confirmed by UNCONFIRMED, DOCTOR ([id number]), editor Mel Almond, Tammy [phone]) on 02/17/2020 3:59:41 PM    I have reviewed the labs performed to date as well as medications administered while in observation.  Recent changes in the last 24 hours include Ambien for insomnia..  Plan  Current plan is for psych placement. Patient is under full IVC at this time.   Hinda Kehr, MD 02/22/20 [phone]

## 2020-02-22 NOTE — ED Notes (Signed)
Butch Penny contacted about patient belongings still in ER. She reported she will come pick up belongings 02/23/20. Belongings placed in bag with patients name in lobby. Grayland Ormond with patient relations made aware

## 2020-02-22 NOTE — ED Notes (Signed)
Pt given breakfast tray

## 2020-02-22 NOTE — TOC Progression Note (Signed)
Transition of Care [facility]) - Progression Note    Patient Details  Name: Scott [name] MRN: [id number] Date of Birth: 05/31/1951  Transition of Care [facility] CM/SW [place], [place] Phone Number: [phone] 02/22/2020, 1:08 PM  Clinical Narrative:     Patient did not have TOC consult.  Patient d/c to [facility]. CSW received a call from [facility] stating they had not been notified patient was returning today.  [facility] stated they never received report from [facility] ED staff.  This CSW reached out to ED/RN assigned to this patient and updated her on need for report to [facility]. ED/RN stated she would contact [facility] CSW updated ED/RN that I gave her contact information to [facility] and they will be contacting her.  ED/RN verbalized understanding.      Expected Discharge Plan and Services                                                 Social Determinants of Health (SDOH) Interventions    Readmission Risk Interventions No flowsheet data found.

## 2020-02-22 NOTE — ED Notes (Signed)
Patient was in bed shouting out. Writer went and checked on patient. Patient took off brief. This Probation officer and ED tech Colletta [place] put clean brief on patient.

## 2020-02-22 NOTE — ED Notes (Signed)
Pt given vanilla ice cream

## 2020-02-22 NOTE — ED Provider Notes (Signed)
Procedures     ----------------------------------------- 10:30 AM on 02/22/2020 -----------------------------------------   Pt cleared for DC by psychiatry. Dr. Janese Banks reports he has spoken with guardian and [facility] willing to take pt back. Dr. Janese Banks has adjusted medication regimen and I have provided new rx for these medications per Dr. Elroy Channel recommendations.    Carrie Mew, MD 02/22/20 1031

## 2020-03-13 ENCOUNTER — Emergency Department
Admission: EM | Admit: 2020-03-13 | Discharge: 2020-03-28 | Disposition: A | Discharge status: Home / Self Care | Payer: Medicare Other | Attending: Emergency Medicine | Admitting: Emergency Medicine

## 2020-03-13 ENCOUNTER — Other Ambulatory Visit: Payer: Self-pay

## 2020-03-13 DIAGNOSIS — I10 Essential (primary) hypertension: Secondary | ICD-10-CM | POA: Diagnosis not present

## 2020-03-13 DIAGNOSIS — Z794 Long term (current) use of insulin: Secondary | ICD-10-CM | POA: Diagnosis not present

## 2020-03-13 DIAGNOSIS — F17210 Nicotine dependence, cigarettes, uncomplicated: Secondary | ICD-10-CM | POA: Diagnosis not present

## 2020-03-13 DIAGNOSIS — E11649 Type 2 diabetes mellitus with hypoglycemia without coma: Secondary | ICD-10-CM | POA: Diagnosis not present

## 2020-03-13 DIAGNOSIS — F203 Undifferentiated schizophrenia: Secondary | ICD-10-CM | POA: Diagnosis not present

## 2020-03-13 DIAGNOSIS — F2089 Other schizophrenia: Secondary | ICD-10-CM | POA: Insufficient documentation

## 2020-03-13 DIAGNOSIS — Z79899 Other long term (current) drug therapy: Secondary | ICD-10-CM | POA: Insufficient documentation

## 2020-03-13 DIAGNOSIS — Z7984 Long term (current) use of oral hypoglycemic drugs: Secondary | ICD-10-CM | POA: Insufficient documentation

## 2020-03-13 DIAGNOSIS — Z20822 Contact with and (suspected) exposure to covid-19: Secondary | ICD-10-CM | POA: Diagnosis not present

## 2020-03-13 DIAGNOSIS — F0390 Unspecified dementia without behavioral disturbance: Secondary | ICD-10-CM | POA: Insufficient documentation

## 2020-03-13 DIAGNOSIS — R2689 Other abnormalities of gait and mobility: Secondary | ICD-10-CM | POA: Insufficient documentation

## 2020-03-13 DIAGNOSIS — E871 Hypo-osmolality and hyponatremia: Secondary | ICD-10-CM | POA: Diagnosis not present

## 2020-03-13 DIAGNOSIS — R748 Abnormal levels of other serum enzymes: Secondary | ICD-10-CM | POA: Insufficient documentation

## 2020-03-13 DIAGNOSIS — R451 Restlessness and agitation: Secondary | ICD-10-CM | POA: Diagnosis present

## 2020-03-13 DIAGNOSIS — M6281 Muscle weakness (generalized): Secondary | ICD-10-CM | POA: Insufficient documentation

## 2020-03-13 LAB — CBC
HCT: 27.5 % — ABNORMAL LOW (ref 39.0–52.0)
Hemoglobin: 8.5 g/dL — ABNORMAL LOW (ref 13.0–17.0)
MCH: 26.4 pg (ref 26.0–34.0)
MCHC: 30.9 g/dL (ref 30.0–36.0)
MCV: 85.4 fL (ref 80.0–100.0)
Platelets: 286 10*3/uL (ref 150–400)
RBC: 3.22 MIL/uL — ABNORMAL LOW (ref 4.22–5.81)
RDW: 16.4 % — ABNORMAL HIGH (ref 11.5–15.5)
WBC: 7.5 10*3/uL (ref 4.0–10.5)
nRBC: 0 % (ref 0.0–0.2)

## 2020-03-13 LAB — HEMOGLOBIN A1C
Hgb A1c MFr Bld: 7.6 % — ABNORMAL HIGH (ref 4.8–5.6)
Mean Plasma Glucose: 171.42 mg/dL

## 2020-03-13 LAB — COMPREHENSIVE METABOLIC PANEL WITH GFR
ALT: 32 U/L (ref 0–44)
AST: 32 U/L (ref 15–41)
Albumin: 3.3 g/dL — ABNORMAL LOW (ref 3.5–5.0)
Alkaline Phosphatase: 244 U/L — ABNORMAL HIGH (ref 38–126)
Anion gap: 11 (ref 5–15)
BUN: 22 mg/dL (ref 8–23)
CO2: 20 mmol/L — ABNORMAL LOW (ref 22–32)
Calcium: 9 mg/dL (ref 8.9–10.3)
Chloride: 97 mmol/L — ABNORMAL LOW (ref 98–111)
Creatinine, Ser: 1.09 mg/dL (ref 0.61–1.24)
GFR, Estimated: >60 mL/min
Glucose, Bld: 139 mg/dL — ABNORMAL HIGH (ref 70–99)
Potassium: 5 mmol/L (ref 3.5–5.1)
Sodium: 128 mmol/L — ABNORMAL LOW (ref 135–145)
Total Bilirubin: 1 mg/dL (ref 0.3–1.2)
Total Protein: 7.4 g/dL (ref 6.5–8.1)

## 2020-03-13 LAB — RESPIRATORY PANEL BY RT PCR (FLU A&B, COVID)
Influenza A by PCR: NEGATIVE
Influenza B by PCR: NEGATIVE
SARS Coronavirus 2 by RT PCR: NEGATIVE

## 2020-03-13 LAB — ETHANOL: Alcohol, Ethyl (B): <10 mg/dL

## 2020-03-13 LAB — GLUCOSE, CAPILLARY
Glucose-Capillary: 160 mg/dL — ABNORMAL HIGH (ref 70–99)
Glucose-Capillary: 186 mg/dL — ABNORMAL HIGH (ref 70–99)
Glucose-Capillary: 215 mg/dL — ABNORMAL HIGH (ref 70–99)

## 2020-03-13 LAB — ACETAMINOPHEN LEVEL: Acetaminophen (Tylenol), Serum: <10 ug/mL — ABNORMAL LOW (ref 10–30)

## 2020-03-13 LAB — SALICYLATE LEVEL: Salicylate Lvl: <7 mg/dL — ABNORMAL LOW (ref 7.0–30.0)

## 2020-03-13 MED ORDER — DIVALPROEX SODIUM 500 MG PO DR TAB
500.0000 mg | DELAYED_RELEASE_TABLET | Freq: Two times a day (BID) | ORAL | Status: DC
  Administered 2020-03-13 – 2020-03-28 (×30): 500 mg via ORAL
  Filled 2020-03-13 (×30): qty 1

## 2020-03-13 MED ORDER — TAMSULOSIN HCL 0.4 MG PO CAPS
0.4000 mg | ORAL_CAPSULE | Freq: Every evening | ORAL | Status: DC
  Administered 2020-03-13 – 2020-03-27 (×14): 0.4 mg via ORAL
  Filled 2020-03-13 (×14): qty 1

## 2020-03-13 MED ORDER — HALOPERIDOL 5 MG PO TABS
5.0000 mg | ORAL_TABLET | Freq: Every day | ORAL | Status: DC
  Administered 2020-03-13 – 2020-03-27 (×14): 5 mg via ORAL
  Filled 2020-03-13 (×15): qty 1

## 2020-03-13 MED ORDER — FAMOTIDINE 20 MG PO TABS
20.0000 mg | ORAL_TABLET | Freq: Every day | ORAL | Status: DC
  Administered 2020-03-13 – 2020-03-27 (×15): 20 mg via ORAL
  Filled 2020-03-13 (×15): qty 1

## 2020-03-13 MED ORDER — BENZTROPINE MESYLATE 1 MG PO TABS
1.0000 mg | ORAL_TABLET | Freq: Two times a day (BID) | ORAL | Status: DC
  Administered 2020-03-13 – 2020-03-28 (×30): 1 mg via ORAL
  Filled 2020-03-13 (×30): qty 1

## 2020-03-13 MED ORDER — INSULIN ASPART 100 UNIT/ML ~~LOC~~ SOLN
0.0000 [IU] | Freq: Three times a day (TID) | SUBCUTANEOUS | Status: DC
  Administered 2020-03-13 (×2): 2 [IU] via SUBCUTANEOUS
  Administered 2020-03-14: 1 [IU] via SUBCUTANEOUS
  Administered 2020-03-15: 0 [IU] via SUBCUTANEOUS
  Administered 2020-03-16: 1 [IU] via SUBCUTANEOUS
  Administered 2020-03-21: 2 [IU] via SUBCUTANEOUS
  Administered 2020-03-22: 1 [IU] via SUBCUTANEOUS
  Filled 2020-03-13 (×5): qty 1

## 2020-03-13 MED ORDER — INSULIN GLARGINE 100 UNIT/ML ~~LOC~~ SOLN
18.0000 [IU] | Freq: Every day | SUBCUTANEOUS | Status: DC
  Administered 2020-03-13 – 2020-03-16 (×4): 18 [IU] via SUBCUTANEOUS
  Filled 2020-03-13 (×5): qty 0.18

## 2020-03-13 MED ORDER — TRAZODONE HCL 50 MG PO TABS
150.0000 mg | ORAL_TABLET | Freq: Every day | ORAL | Status: DC
  Administered 2020-03-13 – 2020-03-27 (×15): 150 mg via ORAL
  Filled 2020-03-13 (×15): qty 1

## 2020-03-13 MED ORDER — CLONAZEPAM 0.5 MG PO TABS
0.5000 mg | ORAL_TABLET | Freq: Two times a day (BID) | ORAL | Status: DC
  Administered 2020-03-13 – 2020-03-28 (×31): 0.5 mg via ORAL
  Filled 2020-03-13 (×31): qty 1

## 2020-03-13 MED ORDER — ZIPRASIDONE MESYLATE 20 MG IM SOLR
20.0000 mg | Freq: Once | INTRAMUSCULAR | Status: AC
  Administered 2020-03-13: 20 mg via INTRAMUSCULAR
  Filled 2020-03-13: qty 20

## 2020-03-13 MED ORDER — DIVALPROEX SODIUM ER 250 MG PO TB24
750.0000 mg | ORAL_TABLET | Freq: Every day | ORAL | Status: DC
  Administered 2020-03-13: 750 mg via ORAL
  Filled 2020-03-13: qty 3

## 2020-03-13 MED ORDER — HALOPERIDOL 2 MG PO TABS
2.0000 mg | ORAL_TABLET | Freq: Every day | ORAL | Status: DC
  Administered 2020-03-13 – 2020-03-28 (×16): 2 mg via ORAL
  Filled 2020-03-13 (×17): qty 1

## 2020-03-13 MED ORDER — METFORMIN HCL 500 MG PO TABS
1000.0000 mg | ORAL_TABLET | Freq: Two times a day (BID) | ORAL | Status: DC
  Administered 2020-03-13 – 2020-03-28 (×31): 1000 mg via ORAL
  Filled 2020-03-13 (×31): qty 2

## 2020-03-13 MED ORDER — INSULIN ASPART 100 UNIT/ML ~~LOC~~ SOLN
0.0000 [IU] | Freq: Every day | SUBCUTANEOUS | Status: DC
  Administered 2020-03-13: 2 [IU] via SUBCUTANEOUS
  Filled 2020-03-13: qty 1

## 2020-03-13 MED ORDER — OLANZAPINE 5 MG PO TABS
15.0000 mg | ORAL_TABLET | Freq: Two times a day (BID) | ORAL | Status: DC
  Administered 2020-03-13 – 2020-03-28 (×30): 15 mg via ORAL
  Filled 2020-03-13 (×30): qty 3

## 2020-03-13 MED ORDER — ATORVASTATIN CALCIUM 20 MG PO TABS
40.0000 mg | ORAL_TABLET | Freq: Every evening | ORAL | Status: DC
  Administered 2020-03-13 – 2020-03-27 (×14): 40 mg via ORAL
  Filled 2020-03-13 (×14): qty 2

## 2020-03-13 NOTE — BH Assessment (Signed)
Referral information for Psychiatric Hospitalization faxed to;   [facility] Cristal Ford [phone]),   . [facility] [phone]- [phone]) [phone]  . [place] ([phone]),  . [name] [phone], [phone], [phone] or [phone]),   . [facility] [phone]),   . [facility] [phone] or [phone])  . [place] [phone] or [phone]),   . [name] [phone]).

## 2020-03-13 NOTE — ED Notes (Addendum)
Pt in room laying in bed but talking to self. Unsure if pt is experiencing auditory hallucinations or not but appears to be having conversations, it is hard to understand what he is saying.
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
## 2020-03-13 NOTE — ED Notes (Signed)
Hourly rounding reveals patient awake in room. No complaints, stable, in no acute distress. Q15 minute rounds and monitoring via Rover and Officer to continue.  

## 2020-03-13 NOTE — ED Notes (Signed)
Pt belongings include one jacket, one gray shirt, one pair pants, two shoes. 1/1 belongings bag.
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
## 2020-03-13 NOTE — ED Notes (Signed)
Psych and TTS at bedside. 

## 2020-03-13 NOTE — ED Notes (Signed)
Pt again awake in room, sitting on side of bed. Pt yelling, difficult to understand pt due to speech but able to understand "I ain't dealing with this."

## 2020-03-13 NOTE — ED Notes (Signed)
Pt given lunch tray.

## 2020-03-13 NOTE — ED Notes (Signed)
Report to include Situation, Background, Assessment, and Recommendations received from Judson Roch, [place]. Patient alert and oriented, warm and dry, in no acute distress. Patient denies SI, HI, AVH and pain. Patient made aware of Q15 minute rounds and Engineer, drilling presence for their safety. Patient instructed to come to this nurse with needs or concerns.
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
## 2020-03-13 NOTE — ED Notes (Signed)
Snack and beverage given. 
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
## 2020-03-13 NOTE — ED Notes (Signed)
Hourly rounding reveals patient in room. No complaints, stable, in no acute distress. Q15 minute rounds and monitoring via Rover and Officer to continue.   
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
## 2020-03-13 NOTE — ED Provider Notes (Signed)
-----------------------------------------   11:14 PM on 03/13/2020 -----------------------------------------  Patient has become agitated, requires frequent direction.  Escalating his behavior, acting more aggressive, trying to leave.  Lacks decision making capacity.  Presents a danger to himself and others.  Took his nighttime meds willingly but they are not helping to de-escalate the situation.  He is willing to accept an intramuscular injection.  Ordering Geodon 20 mg IM as a calming agent to prevent further escalation of his behavior and to avoid interactions that may be dangerous for himself and ED staff.  Given his escalating behavior and his lack of decision making capacity, I am placing him under involuntary commitment.     Hinda Kehr, MD 03/13/20 [phone]

## 2020-03-13 NOTE — ED Notes (Signed)
On call number for donna graves: [phone]

## 2020-03-14 DIAGNOSIS — F203 Undifferentiated schizophrenia: Secondary | ICD-10-CM | POA: Diagnosis not present

## 2020-03-14 DIAGNOSIS — E871 Hypo-osmolality and hyponatremia: Secondary | ICD-10-CM | POA: Diagnosis not present

## 2020-03-14 LAB — GLUCOSE, CAPILLARY
Glucose-Capillary: 83 mg/dL (ref 70–99)
Glucose-Capillary: 118 mg/dL — ABNORMAL HIGH (ref 70–99)
Glucose-Capillary: 142 mg/dL — ABNORMAL HIGH (ref 70–99)
Glucose-Capillary: 123 mg/dL — ABNORMAL HIGH (ref 70–99)

## 2020-03-14 NOTE — ED Notes (Signed)
Lights dimmed in room for pt comfort and warm blanket provided. Pt states he is going to sleep

## 2020-03-14 NOTE — ED Notes (Signed)
Hourly rounding reveals patient in room. No complaints, stable, in no acute distress. Q15 minute rounds and monitoring via Rover and Officer to continue.   

## 2020-03-14 NOTE — ED Notes (Signed)
Pt given breakfast tray

## 2020-03-14 NOTE — BH Assessment (Signed)
Referral Check:   [facility] ([phone]- [phone]), No answer    [facility] [phone])  ([phone]) Janett Billow reports denied due to fall risk and unable to independently perform ADLS   [place] ([phone]), [place] request re-fax; Task completed 2:30pm    [name] [phone], [phone], [phone] or [phone]), No answer    [facility] [phone]), Ebony Hail reports no beds avaliable right now; do not see referral so pt potentially denied; re-fax tomorrow     [facility] [phone] or [phone]) Someone answered hung up; no answer when called back    [place] [phone] or [phone]), Caryl Pina reports pt to be under review, agree to call back    [name] [phone]). Left voicemail

## 2020-03-14 NOTE — ED Notes (Addendum)
RN received a call from patient's legal guardian, [name].  The patient has been immediately discharged from the [facility].

## 2020-03-14 NOTE — Progress Notes (Signed)
Inpatient Diabetes Program Recommendations  AACE/ADA: New Consensus Statement on Inpatient Glycemic Control (2015)  Target Ranges:  Prepandial:   less than 140 mg/dL      Peak postprandial:   less than 180 mg/dL (1-2 hours)      Critically ill patients:  140 - 180 mg/dL   Lab Results  Component Value Date   GLUCAP 83 03/14/2020   HGBA1C 7.6 (H) 03/13/2020    Review of Glycemic Control Results for BOBY, EYER (MRN [id number]) as of 03/14/2020 10:15  Ref. Range 02/17/2020 04:23 03/13/2020 14:39 03/13/2020 17:55 03/13/2020 21:08 03/14/2020 08:44  Glucose-Capillary Latest Ref Range: 70 - 99 mg/dL 217 (H) 160 (H) 186 (H) 215 (H) 83   Diabetes history: DM2 Outpatient Diabetes medications: Lantus 18 units qd + Novolog 0-10 units qid + Metformin 1 gm bid Current orders for Inpatient glycemic control: Lantus 18 units + Metformin 1 gm bid + Novolog sensitive correction tid + hs 0-5 units  Inpatient Diabetes Program Recommendations:   -Decrease Lantus to 15 units daily -May need meal coverage Novolog 2-3 units added tid if eating increases >50% meals  Thank you, Bethena Roys E. Paislei Dorval, RN, MSN, CDE  Diabetes Coordinator Inpatient Glycemic Control Team Team Pager [phone] (8am-5pm) 03/14/2020 10:19 AM

## 2020-03-14 NOTE — ED Notes (Signed)
Pt continues to yell from his bed.  Pt stops yelling when staff enters his room.  Pt is not aggressive physically.

## 2020-03-14 NOTE — ED Notes (Signed)
Pt was cleaned of incontinence at this time by Tech.

## 2020-03-14 NOTE — ED Provider Notes (Signed)
Emergency Medicine Observation Re-evaluation Note  Scott [name] is a 69 y.o. male, seen on rounds today.  Pt initially presented to the ED for complaints of Psychiatric Evaluation Currently, the patient is resting.  Physical Exam  BP 125/74   Pulse 95   Temp 97.9 F (36.6 C) (Oral)   Resp 20   Ht 1.93 m (6\' 4" )   Wt 90 kg   SpO2 100%   BMI 24.15 kg/m  Physical Exam Gen:  No acute distress Resp:  Breathing easily and comfortably, no accessory muscle usage Neuro:  Moving all four extremities, no gross focal neuro deficits Psych:  Resting currently, was agitated earlier and required intramuscular calming agent addition to his nighttime medications.   ED Course / MDM  EKG:    I have reviewed the labs performed to date as well as medications administered while in observation.  Recent changes in the last 24 hours include involuntary commitment, regular medications and intramuscular calming agent for agitation and trying to leave.  Plan  Current plan is for psychiatric evaluation and disposition. Patient is under full IVC at this time.   Hinda Kehr, MD 03/14/20 [phone]

## 2020-03-14 NOTE — ED Notes (Signed)
Report received from Amy, RN including Situation, Background, Assessment, and Recommendations. Patient alert , warm and dry, and in no acute distress. Patient denies SI, HI, AVH and pain. Patient made aware of Q15 minute rounds and Engineer, drilling presence for their safety. Patient instructed to come to this nurse with needs or concerns.

## 2020-03-14 NOTE — BH Assessment (Signed)
Referral Check:   [facility] ([phone]- [phone]), No answer   [facility] [phone]), No answer   [name] [phone], [phone], [phone] or [phone]), No answer    [facility] [phone]), Ebony Hail reports no beds avaliable right now; do not see referral so pt potentially denied; re-fax tomorrow     [facility] [phone] or [phone]) 5:35 AM Per Clair Gulling there are no appropriate beds at this time. Geriatric patients are going on a wait list at this time.    [facility] [phone] or [phone]), 9:46 PM Maggie reports pt remains under review as they are still waiting on call from doctor. -5:27 AM No answer   [name] [phone]). 5:29 AM Left voicemail
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
## 2020-03-14 NOTE — ED Notes (Signed)
IVC pending placement 

## 2020-03-15 ENCOUNTER — Emergency Department: Payer: Medicare Other

## 2020-03-15 DIAGNOSIS — E871 Hypo-osmolality and hyponatremia: Secondary | ICD-10-CM | POA: Diagnosis not present

## 2020-03-15 LAB — GLUCOSE, CAPILLARY
Glucose-Capillary: 69 mg/dL — ABNORMAL LOW (ref 70–99)
Glucose-Capillary: 99 mg/dL (ref 70–99)
Glucose-Capillary: 114 mg/dL — ABNORMAL HIGH (ref 70–99)
Glucose-Capillary: 72 mg/dL (ref 70–99)

## 2020-03-15 MED ORDER — DROPERIDOL 2.5 MG/ML IJ SOLN
5.0000 mg | Freq: Once | INTRAMUSCULAR | Status: AC
  Administered 2020-03-15: 5 mg via INTRAMUSCULAR
  Filled 2020-03-15: qty 2

## 2020-03-15 NOTE — Progress Notes (Signed)
Inpatient Diabetes Program Recommendations  AACE/ADA: New Consensus Statement on Inpatient Glycemic Control (2015)  Target Ranges:  Prepandial:   less than 140 mg/dL      Peak postprandial:   less than 180 mg/dL (1-2 hours)      Critically ill patients:  140 - 180 mg/dL   Lab Results  Component Value Date   GLUCAP 69 (L) 03/15/2020   HGBA1C 7.6 (H) 03/13/2020    Review of Glycemic Control Results for Scott [name], Scott [name] (MRN [id number]) as of 03/15/2020 11:23  Ref. Range 03/14/2020 08:44 03/14/2020 12:17 03/14/2020 18:01 03/14/2020 22:01 03/15/2020 08:01  Glucose-Capillary Latest Ref Range: 70 - 99 mg/dL 83 118 (H) 142 (H) 123 (H) 69 (L)   Diabetes history: DM2 Outpatient Diabetes medications: Lantus 18 units qd + Novolog 0-10 units qid + Metformin 1 gm bid Current orders for Inpatient glycemic control: Lantus 18 units + Metformin 1 gm bid + Novolog sensitive correction tid + hs 0-5 units  Inpatient Diabetes Program Recommendations:   Fasting CBG 69 -Decrease Lantus to 15 units daily -May need meal coverage Novolog 2-3 units added tid if eating increases >50% meals  Thank you, Bethena Roys E. Aminat [place]ne, RN, MSN, CDE  Diabetes Coordinator Inpatient Glycemic Control Team Team Pager [phone] (8am-5pm) 03/15/2020 11:24 AM

## 2020-03-15 NOTE — ED Notes (Signed)
bLOOD gLUCOSE 99

## 2020-03-15 NOTE — ED Notes (Signed)
Hourly rounding completed at this time, patient currently awake in room. No complaints, stable, and in no acute distress. Q15 minute rounds and monitoring via Rover and Officer to continue. °

## 2020-03-15 NOTE — ED Notes (Signed)
Call received from DSS to check up on patient, requesting plan of care. DSS was advised that psych will contact her when that desicion has been made.

## 2020-03-15 NOTE — ED Provider Notes (Signed)
Emergency Medicine Observation Re-evaluation Note  Scott [name] is a 69 y.o. male, seen on rounds today.  Pt initially presented to the ED for complaints of Psychiatric Evaluation Currently, the patient is resting comfortably.  Physical Exam  BP 138/78 (BP Location: Right Arm)   Pulse (!) 103   Temp 97.9 F (36.6 C) (Oral)   Resp 20   Ht 6\' 4"  (1.93 m)   Wt 90 kg   SpO2 97%   BMI 24.15 kg/m  Physical Exam Constitutional:      Appearance: He is not ill-appearing or toxic-appearing.  Eyes:     Extraocular Movements: Extraocular movements intact.     Pupils: Pupils are equal, round, and reactive to light.  Cardiovascular:     Rate and Rhythm: Normal rate.  Pulmonary:     Effort: Pulmonary effort is normal.  Abdominal:     General: There is no distension.  Skin:    General: Skin is warm and dry.  Neurological:     General: No focal deficit present.     Cranial Nerves: No cranial nerve deficit.      ED Course / MDM  EKG:    I have reviewed the labs performed to date as well as medications administered while in observation.  Recent changes in the last 24 hours include continued bed search.  Plan  Current plan is for inpatient psychiatric care. Patient is under full IVC at this time.   Vladimir Crofts, MD 03/15/20 [phone]

## 2020-03-15 NOTE — ED Notes (Signed)
Hourly rounding completed at this time, patient awake currently in room. Pt continues to yell out and it is difficult to understand, pt agitated in room and when speaking with is excessively loud and states that he is going home and to give him his money back. MD notified. Q15 minute rounds and monitoring via Engineer, drilling to continue.

## 2020-03-15 NOTE — ED Notes (Signed)
Hourly rounding completed at this time, patient currently asleep in room. No complaints, stable, and in no acute distress. Q15 minute rounds and monitoring via Rover and Officer to continue. 

## 2020-03-15 NOTE — ED Notes (Signed)
Hourly rounding reveals patient in room. No complaints, stable, in no acute distress. Q15 minute rounds and monitoring via Rover and Officer to continue.   

## 2020-03-15 NOTE — ED Notes (Signed)
Pt incontinent of urine. Pt removed his brief and placed it on the floor. This tech cleaned pt and placed clean brief on him. Pt sitting in bed with no further complaints.

## 2020-03-15 NOTE — BH Assessment (Signed)
Referral Check:    Scott [name] [phone]- [phone]),DXIPJAS reports denied due to behaviors   Scott [name] ([phone]---[phone]),Scott [name] reports to be sorting through many referrals and to call back in the morning    Scott [name] [phone], [phone], [phone] or [phone]),Scott [name] reports to be full and not accepting outside referrals at this time; call back tomorrow      Scott [name] [phone] reports the gero unit to be closed and suggest for TTS to call back tomorrow to talk with Scott [name] [phone] or [phone])Scott [name] reports to have no appropriate beds at this time and Geriatric pt's are going on a wait list at this time.     Scott [name] [phone] or [phone] request to be faxed UA, UDS, IVC, chest xray and updated sodium levels   Scott [name] [phone]).4:[phone] Left voicemail

## 2020-03-15 NOTE — ED Notes (Signed)
Report to include Situation, Background, Assessment, and Recommendations received from [facility]. Patient alert and oriented, warm and dry, in no acute distress. Patient denies SI, HI, AVH and pain. Patient made aware of Q15 minute rounds and Engineer, drilling presence for their safety. Patient instructed to come to me with needs or concerns.

## 2020-03-15 NOTE — ED Notes (Signed)
Pt cleaned of incontinence by Eustaquio Maize, Tech at this time.

## 2020-03-15 NOTE — ED Notes (Signed)
Hourly rounding completed at this time, patient currently awake in room stating he is going to go home but also states he isn't going to get out of bed. No complaints, stable, and in no acute distress. Q15 minute rounds and monitoring via Engineer, drilling to continue.

## 2020-03-15 NOTE — ED Notes (Signed)
Pt given meal tray.

## 2020-03-15 NOTE — ED Notes (Signed)
Patient incontinent of bladder. Peri care and change of depends provided.

## 2020-03-15 NOTE — ED Notes (Signed)
IVC/  PENDING  PLACEMENT 

## 2020-03-16 DIAGNOSIS — E871 Hypo-osmolality and hyponatremia: Secondary | ICD-10-CM | POA: Diagnosis not present

## 2020-03-16 LAB — BASIC METABOLIC PANEL
Anion gap: 12 (ref 5–15)
BUN: 15 mg/dL (ref 8–23)
CO2: 21 mmol/L — ABNORMAL LOW (ref 22–32)
Calcium: 8.7 mg/dL — ABNORMAL LOW (ref 8.9–10.3)
Chloride: 98 mmol/L (ref 98–111)
Creatinine, Ser: 0.9 mg/dL (ref 0.61–1.24)
GFR, Estimated: >60 mL/min (ref >60)
Glucose, Bld: 89 mg/dL (ref 70–99)
Potassium: 4.9 mmol/L (ref 3.5–5.1)
Sodium: 131 mmol/L — ABNORMAL LOW (ref 135–145)

## 2020-03-16 LAB — GLUCOSE, CAPILLARY
Glucose-Capillary: 57 mg/dL — ABNORMAL LOW (ref 70–99)
Glucose-Capillary: 92 mg/dL (ref 70–99)
Glucose-Capillary: 92 mg/dL (ref 70–99)
Glucose-Capillary: 85 mg/dL (ref 70–99)

## 2020-03-16 MED ORDER — HALOPERIDOL LACTATE 5 MG/ML IJ SOLN
INTRAMUSCULAR | Status: AC
  Administered 2020-03-16: 5 mg
  Filled 2020-03-16: qty 1

## 2020-03-16 NOTE — ED Notes (Signed)
Hourly rounding reveals patient in room. No complaints, stable, in no acute distress. Q15 minute rounds and monitoring via Rover and Officer to continue.   

## 2020-03-16 NOTE — ED Notes (Signed)
INVOLUNTARY/awaiting placement °

## 2020-03-16 NOTE — ED Notes (Signed)
Patient refused his dinner

## 2020-03-16 NOTE — ED Notes (Signed)
Blood Glucose 92

## 2020-03-16 NOTE — BH Assessment (Signed)
Requested information (IVC, chest xray and updated sodium levels) faxed to [facility].

## 2020-03-16 NOTE — ED Notes (Signed)
Incontinent of bladder peri care provided, clean brief and clothes provided.

## 2020-03-16 NOTE — BH Assessment (Signed)
Referral Check:    Rosana Hoes ([phone]),Left voicemail requesting call back.    [name] [phone], [phone] or [phone]),No answer    Alyssa Grove [phone] answer   [employer] [phone] or [phone])Per Clair Gulling, pt denied due to acuity.   Boykin Nearing [phone] or [phone] reports denied due to behaviors   [name] [phone]).Left voicemail

## 2020-03-16 NOTE — Progress Notes (Signed)
Inpatient Diabetes Program Recommendations  AACE/ADA: New Consensus Statement on Inpatient Glycemic Control (2015)  Target Ranges:  Prepandial:   less than 140 mg/dL      Peak postprandial:   less than 180 mg/dL (1-2 hours)      Critically ill patients:  140 - 180 mg/dL   Lab Results  Component Value Date   GLUCAP 57 (L) 03/16/2020   HGBA1C 7.6 (H) 03/13/2020    Review of Glycemic Control  Diabetes history:DM2 Outpatient Diabetes medications:Lantus 18 units qd + Novolog 0-10 units qid + Metformin 1 gm bid Current orders for Inpatient glycemic control:Lantus 18 units + Metformin 1 gm bid + Novolog sensitive correction tid + hs 0-5 units  Inpatient Diabetes Program Recommendations: Fasting CBG 57 -Decrease Lantus to 15 units daily  Secure chat sent to RN Venia Carbon regarding recommendations. Dr. Tamala Julian is following today.  Thank you, Nani Gasser. Jonatha Gagen, RN, MSN, CDE  Diabetes Coordinator Inpatient Glycemic Control Team Team Pager [phone] (8am-5pm) 03/16/2020 9:30 AM

## 2020-03-16 NOTE — ED Notes (Signed)
Assumed care of patient, patient calm and cooperative this morning. Denies pain or discomforts. Patient awaiting geri psych placement. Safety maintained will continue to monitor.

## 2020-03-17 DIAGNOSIS — E871 Hypo-osmolality and hyponatremia: Secondary | ICD-10-CM | POA: Diagnosis not present

## 2020-03-17 LAB — GLUCOSE, CAPILLARY
Glucose-Capillary: 89 mg/dL (ref 70–99)
Glucose-Capillary: 82 mg/dL (ref 70–99)
Glucose-Capillary: 85 mg/dL (ref 70–99)
Glucose-Capillary: 97 mg/dL (ref 70–99)

## 2020-03-17 MED ORDER — ACETAMINOPHEN 325 MG PO TABS
650.0000 mg | ORAL_TABLET | Freq: Four times a day (QID) | ORAL | Status: DC | PRN
  Administered 2020-03-17 – 2020-03-26 (×4): 650 mg via ORAL
  Filled 2020-03-17 (×5): qty 2

## 2020-03-17 MED ORDER — INSULIN GLARGINE 100 UNIT/ML ~~LOC~~ SOLN
15.0000 [IU] | Freq: Every day | SUBCUTANEOUS | Status: DC
  Administered 2020-03-17: 15 [IU] via SUBCUTANEOUS
  Filled 2020-03-17 (×2): qty 0.15

## 2020-03-17 NOTE — ED Notes (Signed)
Pt changed into new brief and scrubs.  Bedding changed. Warm blankets given.

## 2020-03-17 NOTE — ED Notes (Signed)
Hourly rounding reveals patient in room. No complaints, stable, in no acute distress. Q15 minute rounds and monitoring via Rover and Officer to continue.   

## 2020-03-17 NOTE — Progress Notes (Signed)
Inpatient Diabetes Program Recommendations  AACE/ADA: New Consensus Statement on Inpatient Glycemic Control (2015)  Target Ranges:  Prepandial:   less than 140 mg/dL      Peak postprandial:   less than 180 mg/dL (1-2 hours)      Critically ill patients:  140 - 180 mg/dL   Lab Results  Component Value Date   GLUCAP 82 03/17/2020   HGBA1C 7.6 (H) 03/13/2020    Review of Glycemic Control  Diabetes history:DM2 Outpatient Diabetes medications:Lantus 18 units qd + Novolog 0-10 units qid + Metformin 1 gm bid Current orders for Inpatient glycemic control:Lantus 18 units + Metformin 1 gm bid + Novolog sensitive correction tid + hs 0-5 units  Inpatient Diabetes Program Recommendations: Fasting CBG 82 -Decrease Lantus to 15 units daily  Secure chat sent to RN Rex Kras regarding recommendations to share with doctor on patient rounds.  Thank you, Nani Gasser. Sawyer Mentzer, RN, MSN, CDE  Diabetes Coordinator Inpatient Glycemic Control Team Team Pager [phone] (8am-5pm) 03/17/2020 12:16 PM

## 2020-03-17 NOTE — ED Notes (Signed)
Pt given lunch tray.

## 2020-03-17 NOTE — ED Notes (Signed)
Pt with soiled brief. Pt cleaned and new brief placed on pt

## 2020-03-17 NOTE — ED Provider Notes (Signed)
Emergency Medicine Observation Re-evaluation Note  Scott [name] is a 69 y.o. male, seen on rounds today.  Pt initially presented to the ED for complaints of Psychiatric Evaluation Currently, the patient is stable this morning with no complaints.  Physical Exam  BP 123/74 (BP Location: Right Arm)   Pulse (!) 101   Temp 97.8 F (36.6 C) (Oral)   Resp 18   Ht 6\' 4"  (1.93 m)   Wt 90 kg   SpO2 100%   BMI 24.15 kg/m  Physical Exam General: No apparent distress HEENT: moist mucous membranes CV: RRR Pulm: Normal WOB GI: soft and non tender MSK: no edema or cyanosis Neuro: face symmetric, moving all extremities    ED Course / MDM    I have reviewed the labs performed to date as well as medications administered while in observation.  No overnight changes and no new labs this morning.  Plan  Current plan is for psychiatric placement.    Rudene Re, MD 03/17/20 [phone]

## 2020-03-17 NOTE — ED Notes (Signed)
Pt yelling from room. This RN at bedside. Pt stating his knee hurts and wants tylenol. MD made aware.

## 2020-03-17 NOTE — ED Notes (Signed)
Pt given apple juice at this time.

## 2020-03-17 NOTE — ED Notes (Signed)
Pt given breakfast tray

## 2020-03-17 NOTE — ED Notes (Signed)
Pt given apple juice  

## 2020-03-17 NOTE — ED Notes (Signed)
Report to include Situation, Background, Assessment, and Recommendations received from [facility]. Patient alert and oriented, warm and dry, in no acute distress. UTA SI, HI, AVH and pain. Patient made aware of Q15 minute rounds and Engineer, drilling presence for their safety. Patient instructed to come to me with needs or concerns.

## 2020-03-18 DIAGNOSIS — E871 Hypo-osmolality and hyponatremia: Secondary | ICD-10-CM | POA: Diagnosis not present

## 2020-03-18 LAB — GLUCOSE, CAPILLARY
Glucose-Capillary: 69 mg/dL — ABNORMAL LOW (ref 70–99)
Glucose-Capillary: 114 mg/dL — ABNORMAL HIGH (ref 70–99)
Glucose-Capillary: 113 mg/dL — ABNORMAL HIGH (ref 70–99)
Glucose-Capillary: 72 mg/dL (ref 70–99)
Glucose-Capillary: 72 mg/dL (ref 70–99)

## 2020-03-18 MED ORDER — INSULIN GLARGINE 100 UNIT/ML ~~LOC~~ SOLN
10.0000 [IU] | Freq: Every day | SUBCUTANEOUS | Status: DC
  Administered 2020-03-18 – 2020-03-22 (×5): 10 [IU] via SUBCUTANEOUS
  Filled 2020-03-18 (×7): qty 0.1

## 2020-03-18 NOTE — Progress Notes (Signed)
Inpatient Diabetes Program Recommendations  AACE/ADA: New Consensus Statement on Inpatient Glycemic Control (2015)  Target Ranges:  Prepandial:   less than 140 mg/dL      Peak postprandial:   less than 180 mg/dL (1-2 hours)      Critically ill patients:  140 - 180 mg/dL   Lab Results  Component Value Date   GLUCAP 114 (H) 03/18/2020   HGBA1C 7.6 (H) 03/13/2020    Review of Glycemic Control Diabetes history:DM2 Outpatient Diabetes medications:Lantus 18 units qd + Novolog 0-10 units qid + Metformin 1 gm bid Current orders for Inpatient glycemic control:Lantus 15 units + Metformin 1 gm bid + Novolog sensitive correction tid + hs 0-5 units Inpatient Diabetes Program Recommendations:    Fasting CBG=69 mg/dL.  Please reduce Lantus to 10 units q HS.    Thanks  Adah Perl, RN, BC-ADM Inpatient Diabetes Coordinator Pager [phone] (8a-5p)

## 2020-03-18 NOTE — BH Assessment (Signed)
Referral Check:   Scott [name] [phone]),Left voicemail requesting call back.    [name] [phone], [phone], [phone] or [phone]),[name] advised TTS to call back later today and ask for [facility].    [facility] [phone] [name] pt is denied.    [name] [phone]).Left voicemail

## 2020-03-18 NOTE — ED Provider Notes (Signed)
Emergency Medicine Observation Re-evaluation Note  Scott [name] is a 69 y.o. male, seen on rounds today.  Pt initially presented to the ED for complaints of Psychiatric Evaluation Currently, the patient is sleeping comfortably, denies any complaints when woken.  Physical Exam  BP 120/72 (BP Location: Left Arm)   Pulse 100   Temp 97.7 F (36.5 C) (Oral)   Resp 18   Ht 6\' 4"  (1.93 m)   Wt 90 kg   SpO2 100%   BMI 24.15 kg/m  Physical Exam Constitutional: Resting comfortably. Eyes: Conjunctivae are normal. Head: Atraumatic. Nose: No congestion/rhinnorhea. Mouth/Throat: Mucous membranes are moist. Neck: Normal ROM Cardiovascular: No cyanosis noted. Respiratory: Normal respiratory effort. Gastrointestinal: Non-distended. Genitourinary: deferred Musculoskeletal: No lower extremity tenderness nor edema. Neurologic:  Normal speech and language. No gross focal neurologic deficits are appreciated. Skin:  Skin is warm, dry and intact. No rash noted.    ED Course / MDM  EKG:EKG Interpretation  Date/Time:  Sunday March 13 2020 17:54:58 EDT Ventricular Rate:  99 PR Interval:  138 QRS Duration: 78 QT Interval:  322 QTC Calculation: 413 R Axis:   35 Text Interpretation: Normal sinus rhythm Septal infarct , age undetermined Abnormal ECG Confirmed by UNCONFIRMED, DOCTOR ([id number]), editor Mel Almond, Tammy [phone]) on 03/15/2020 10:34:28 AM    I have reviewed the labs performed to date as well as medications administered while in observation.  Recent changes in the last 24 hours include none.  Plan  Current plan is for psychiatric admission, pending placement. Patient is under full IVC at this time.   Blake Divine, MD 03/18/20 [phone]

## 2020-03-18 NOTE — ED Notes (Signed)
Hourly rounding reveals patient in room. No complaints, stable, in no acute distress. Q15 minute rounds and monitoring via Rover and Officer to continue.   

## 2020-03-18 NOTE — ED Notes (Signed)
Breakfast tray given. °

## 2020-03-18 NOTE — ED Notes (Signed)
Hourly rounding reveals patient sleeping in room. No complaints, stable, in no acute distress. Q15 minute rounds and monitoring via Security to continue. 

## 2020-03-18 NOTE — ED Notes (Signed)
Orange juice given to patient due to his CBG is 69.

## 2020-03-19 DIAGNOSIS — E871 Hypo-osmolality and hyponatremia: Secondary | ICD-10-CM | POA: Diagnosis not present

## 2020-03-19 LAB — GLUCOSE, CAPILLARY
Glucose-Capillary: 65 mg/dL — ABNORMAL LOW (ref 70–99)
Glucose-Capillary: 60 mg/dL — ABNORMAL LOW (ref 70–99)
Glucose-Capillary: 92 mg/dL (ref 70–99)
Glucose-Capillary: 104 mg/dL — ABNORMAL HIGH (ref 70–99)

## 2020-03-19 NOTE — ED Notes (Signed)
Patient drink OJ, He has taken His clothing off, Nurse and tech to change Patient , applied new brief.

## 2020-03-19 NOTE — ED Notes (Signed)
Breakfast tray given to pt 

## 2020-03-19 NOTE — BH Assessment (Signed)
Spoke with [facility] staff Lawerance Bach [phone] about pt's return back to facility. Per Tabitha there is no admission staff availiable all weekend. Determination can't be made until Monday.

## 2020-03-19 NOTE — ED Provider Notes (Signed)
Emergency Medicine Observation Re-evaluation Note  Scott [name] is a 69 y.o. male, seen on rounds today.  Pt initially presented to the ED for complaints of Psychiatric Evaluation Currently, the patient is sleeping.  Physical Exam  BP (!) 150/89 (BP Location: Right Arm)   Pulse (!) 101   Temp (!) 96.5 F (35.8 C) (Oral)   Resp 20   Ht 1.93 m (6\' 4" )   Wt 90 kg   SpO2 98%   BMI 24.15 kg/m  Physical Exam Gen:  No acute distress Resp:  Breathing easily and comfortably, no accessory muscle usage Neuro:  Moving all four extremities, no gross focal neuro deficits Psych:  Resting currently, calm and cooperative when awake ED Course / MDM  EKG:EKG Interpretation  Date/Time:  Sunday March 13 2020 17:54:58 EDT Ventricular Rate:  99 PR Interval:  138 QRS Duration: 78 QT Interval:  322 QTC Calculation: 413 R Axis:   35 Text Interpretation: Normal sinus rhythm Septal infarct , age undetermined Abnormal ECG Confirmed by UNCONFIRMED, DOCTOR ([id number]), editor Mel Almond, Tammy [phone]) on 03/15/2020 10:34:28 AM    I have reviewed the labs performed to date as well as medications administered while in observation.  Recent changes in the last 24 hours include no significant clinical changes..  Plan  Current plan is for psych/social work placement. Patient is under full IVC at this time.   Hinda Kehr, MD 03/19/20 [phone]

## 2020-03-19 NOTE — ED Notes (Signed)
Placed patient in recliner.  Blankets and pillows given.  Resting quietly

## 2020-03-19 NOTE — BH Assessment (Signed)
Referral Check:   Rosana Hoes [phone]),No answer.   [name] [phone], [phone] or [phone]),Spoke with Nori Riis; requested refax Task completed:  4:49 PM.     [name] [phone]).Left voicemail

## 2020-03-19 NOTE — ED Notes (Signed)
CBG 65.  Pt eating breaskfast

## 2020-03-19 NOTE — ED Notes (Signed)
Patient ate 50% of breakfast, took all po medications, Patient is calm and cooperative, nurse will continue to monitor, q 15 minute checks in progress for safety.

## 2020-03-19 NOTE — ED Notes (Signed)
Dinner tray given to pt

## 2020-03-19 NOTE — ED Notes (Signed)
Pt cleaned up, new brief, clothing, and sheets replaced.  Lunch tray given to pt

## 2020-03-20 DIAGNOSIS — E871 Hypo-osmolality and hyponatremia: Secondary | ICD-10-CM | POA: Diagnosis not present

## 2020-03-20 LAB — GLUCOSE, CAPILLARY
Glucose-Capillary: 67 mg/dL — ABNORMAL LOW (ref 70–99)
Glucose-Capillary: 88 mg/dL (ref 70–99)
Glucose-Capillary: 106 mg/dL — ABNORMAL HIGH (ref 70–99)
Glucose-Capillary: 154 mg/dL — ABNORMAL HIGH (ref 70–99)

## 2020-03-20 NOTE — BH Assessment (Signed)
Per Dr. Janese Banks pt continues to meet criteria for INPT

## 2020-03-20 NOTE — ED Notes (Signed)
Hourly rounding reveals patient in room. No complaints, stable, in no acute distress. Q15 minute rounds and monitoring via Rover and Officer to continue.   

## 2020-03-20 NOTE — ED Notes (Signed)
-  Patient ate 25% of pizza,and 15% of drink.

## 2020-03-20 NOTE — ED Notes (Signed)
IVC/pending placement/IVC to be renewed on 10/18 @ 2AM

## 2020-03-20 NOTE — ED Notes (Signed)
Bed bath given- clean scrubs and brief placed on pt

## 2020-03-20 NOTE — ED Notes (Signed)
Pt given meal tray.

## 2020-03-20 NOTE — BH Assessment (Signed)
Referral Check:   Rosana Hoes [phone]),No answer.   [name] [phone], [phone], [phone] or [phone]),No answers at the any of the numbers listed, Day shift TTS [facility] spoke with Nori Riis; requested refax Task completed on 10/16 4:49 PM.     [name] [phone]).Left voicemail

## 2020-03-20 NOTE — BH Assessment (Addendum)
Referral Check:   Rosana Hoes ([phone]),No answer   [name] [phone], [phone], [phone] or [phone]) Kim transferred,TTS received no answer    [name] [phone]) Pamala Hurry requested re-fax; task completed 3:30pm

## 2020-03-20 NOTE — ED Notes (Signed)
Report to include Situation, Background, Assessment, and Recommendations received from [facility]. Patient alert and oriented, warm and dry, in no acute distress. UTA SI, HI, AVH and pain. Patient made aware of Q15 minute rounds and Engineer, drilling presence for their safety. Patient instructed to come to me with needs or concerns.

## 2020-03-20 NOTE — ED Notes (Signed)
IVC pending placement papers renewed

## 2020-03-21 DIAGNOSIS — E871 Hypo-osmolality and hyponatremia: Secondary | ICD-10-CM | POA: Diagnosis not present

## 2020-03-21 DIAGNOSIS — F203 Undifferentiated schizophrenia: Secondary | ICD-10-CM | POA: Diagnosis not present

## 2020-03-21 LAB — GLUCOSE, CAPILLARY
Glucose-Capillary: 106 mg/dL — ABNORMAL HIGH (ref 70–99)
Glucose-Capillary: 109 mg/dL — ABNORMAL HIGH (ref 70–99)
Glucose-Capillary: 168 mg/dL — ABNORMAL HIGH (ref 70–99)
Glucose-Capillary: 117 mg/dL — ABNORMAL HIGH (ref 70–99)

## 2020-03-21 NOTE — ED Notes (Signed)
Hourly rounding reveals patient in room. No complaints, stable, in no acute distress. Q15 minute rounds and monitoring via Rover and Officer to continue.   

## 2020-03-21 NOTE — ED Notes (Signed)
Gave food tray with juice. 

## 2020-03-21 NOTE — ED Notes (Signed)
Changed pt's bedding and soiled clothes. RN Abigail Butts assisted me with changing pt. Gave a warm blanket and pt is resting now.

## 2020-03-21 NOTE — ED Notes (Signed)
Gave pt lunch tray with juice.
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
## 2020-03-21 NOTE — ED Notes (Signed)
Patient only ate a few bites of His lunch and drink a beverage, Nurse will continue to monitor.

## 2020-03-21 NOTE — BH Assessment (Signed)
Referral Check:   Scott [name] ([phone]---[phone]),No answer   Scott [name] [phone], [phone], [phone] or [phone])Scott [name] agreed to pass pt to morning [facility] as the one for tonight has left.   Scott [name] [phone]) Left voicemail.

## 2020-03-21 NOTE — ED Notes (Signed)
Pt assisted with repositioning in bed from laying on right side, slid up , on back and HOB elevated per request. Pt in position of comfort at this time.

## 2020-03-21 NOTE — ED Provider Notes (Signed)
Emergency Medicine Observation Re-evaluation Note  Scott [name] is a 69 y.o. male, seen on rounds today.  Pt initially presented to the ED for complaints of Psychiatric Evaluation Currently, the patient is resting, voices no medical complaints.  Physical Exam  BP 128/69 (BP Location: Left Arm)   Pulse (!) 107   Temp 97.6 F (36.4 C) (Axillary)   Resp 20   Ht 6\' 4"  (1.93 m)   Wt 90 kg   SpO2 99%   BMI 24.15 kg/m  Physical Exam General: Resting in no acute distress Cardiac: No cyanosis Lungs: Equal rise and fall Psych: Not agitated  ED Course / MDM  EKG:EKG Interpretation  Date/Time:  Sunday March 13 2020 17:54:58 EDT Ventricular Rate:  99 PR Interval:  138 QRS Duration: 78 QT Interval:  322 QTC Calculation: 413 R Axis:   35 Text Interpretation: Normal sinus rhythm Septal infarct , age undetermined Abnormal ECG Confirmed by UNCONFIRMED, DOCTOR ([id number]), editor Mel Almond, Tammy [phone]) on 03/15/2020 10:34:28 AM    I have reviewed the labs performed to date as well as medications administered while in observation.  Recent changes in the last 24 hours include no events overnight.  Plan  Current plan is for psych/social work placement. Patient is under full IVC at this time.   Paulette Blanch, MD 03/21/20 [phone]

## 2020-03-21 NOTE — ED Notes (Signed)
Report received from [place], RN including Situation, Background, Assessment, and Recommendations. Patient alert and oriented, warm and dry, and in no acute distress. Patient denies SI, HI, AVH and pain. Patient made aware of Q15 minute rounds and Engineer, drilling presence for their safety. Patient instructed to come to this nurse with needs or concerns.

## 2020-03-21 NOTE — ED Notes (Signed)
IVC, pend SW placement

## 2020-03-22 DIAGNOSIS — E871 Hypo-osmolality and hyponatremia: Secondary | ICD-10-CM | POA: Diagnosis not present

## 2020-03-22 LAB — GLUCOSE, CAPILLARY
Glucose-Capillary: 96 mg/dL (ref 70–99)
Glucose-Capillary: 121 mg/dL — ABNORMAL HIGH (ref 70–99)
Glucose-Capillary: 102 mg/dL — ABNORMAL HIGH (ref 70–99)

## 2020-03-22 NOTE — ED Notes (Signed)
Hourly rounding completed at this time, patient currently awake in room. No complaints, stable, and in no acute distress. Q15 minute rounds and monitoring via Rover and Officer to continue. °

## 2020-03-22 NOTE — ED Notes (Addendum)
Scott [name], legal guardian, called for update. She will be out of town Wednesday and beyond. Contact supervisor, Jaynee Eagles, phone [phone] if needed during business hours. On call for after hours [phone] (sheriff's office - ask for on call social worker).

## 2020-03-22 NOTE — ED Notes (Signed)
Hourly rounding completed at this time, patient currently asleep in room. No complaints, stable, and in no acute distress. Q15 minute rounds and monitoring via Rover and Officer to continue. 

## 2020-03-22 NOTE — ED Notes (Signed)
Pt at bedside

## 2020-03-22 NOTE — ED Notes (Addendum)
Pt asleep, breakfast tray placed in rm.  

## 2020-03-22 NOTE — ED Notes (Signed)
Resumed care from [place], [place]. Pt awake, requesting water, which was provided by this RN. No complaints, will continue to monitor.

## 2020-03-22 NOTE — ED Notes (Signed)
Pt assisted with drinking water.

## 2020-03-22 NOTE — ED Notes (Signed)
Pt began to wake up and yell in room, this nurse checked on pt and pt states he wants to go home. Pt checked to see if he had voided of had BM, pt dry at this time.

## 2020-03-22 NOTE — ED Notes (Signed)
Pt assisted to sitting position to eat breakfast, immediately lay back down in the bed; states he can eat lying down. We will wait until after his breakfast to clean him and change his clothing/linens.
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
## 2020-03-22 NOTE — Progress Notes (Signed)
OT Cancellation Note  Patient Details Name: Scott [name] MRN: [id number] DOB: 08/21/1950   Cancelled Treatment:    Reason Eval/Treat Not Completed: Patient declined, no reason specified. OT order received and chart reviewed. OT evaluation attempted. Pt supine in bed with covers over face and declined evaluation. Pt reporting he "did not feel good" but unable to explain further. Pt is resistive to therapist attempt to help him to EOB. OT will re-attempt at next available time.   Darleen Crocker, [place], OTR/L , CBIS ascom [phone]  03/22/20, 4:22 PM   03/22/2020, 4:20 PM

## 2020-03-22 NOTE — ED Notes (Signed)
Pt cleaned of urine at this time. New brief and chux under pt.

## 2020-03-22 NOTE — ED Notes (Signed)
VOLUNTARY/IVC rescinded by Dr Weber Cooks, will await SW consult.

## 2020-03-22 NOTE — ED Notes (Addendum)
Pt given breakfast tray, but he states he doesn't like any of the food. Alternatives offered and refused, other than diet soda.

## 2020-03-22 NOTE — ED Notes (Signed)
Pt given lunch tray.

## 2020-03-22 NOTE — ED Notes (Signed)
Changed pt's bedding, clothes,and cleaned pt up. Pt was also given food tray with drink. Pt has not been eating and drinking very well. RN Stanton Kidney assisted with cleaning pt up,and is aware of pt not eating and drinking very well.

## 2020-03-22 NOTE — ED Notes (Signed)
Pt's clothing and bedding changed.

## 2020-03-22 NOTE — Progress Notes (Signed)
PT Cancellation Note  Patient Details Name: Scott [name] MRN: [id number] DOB: 08/10/1950   Cancelled Treatment:    Reason Eval/Treat Not Completed: Patient declined, no reason specified.  PT consult received.  Chart received.  Discussed pt's status with nurse.  Upon PT/OT arrival to pt's room, pt laying in bed with covers over his head.  Attempted to get pt to participate in therapy but pt refusing d/t wanting to rest and not feeling good (but pt would not expand on this).  Will re-attempt PT evaluation at a later date/time.  Leitha Bleak, PT 03/22/20, 1:56 PM

## 2020-03-23 DIAGNOSIS — E871 Hypo-osmolality and hyponatremia: Secondary | ICD-10-CM | POA: Diagnosis not present

## 2020-03-23 LAB — GLUCOSE, CAPILLARY: Glucose-Capillary: 95 mg/dL (ref 70–99)

## 2020-03-23 NOTE — ED Notes (Addendum)
Pt given lunch tray but refused stating "I do not like spaghetti." Tana Conch, RN at bedside and is aware. Pt repositioned in recliner. Bed linens changed.

## 2020-03-23 NOTE — ED Notes (Signed)
Patient ate only a few bites of [place] and mashed potatoes and was fed by this RN. Patient took a few sips of tea and requested apple juice and diet [place]. Patient took only a few sips of each. Patient declined offer of apple sauce.

## 2020-03-23 NOTE — ED Notes (Addendum)
Patient refused lunch, states he didn't like spaghetti and peaches. Patient pulled up on recliner with [facility] ED tech. Patient states he just wants to sleep. Brief was not soiled. Patient refused another warm blanket.

## 2020-03-23 NOTE — ED Notes (Signed)
OT at bedside. 

## 2020-03-23 NOTE — ED Notes (Signed)
Patient refused to eat snack. He received beverage and ice cream. No issues.

## 2020-03-23 NOTE — ED Notes (Signed)
Report to include Situation, Background, Assessment, and Recommendations received from [facility]. Patient alert and oriented, warm and dry, in no acute distress. UTA SI, HI, AVH and pain. Patient made aware of Q15 minute rounds and Engineer, drilling presence for their safety. Patient instructed to come to me with needs or concerns.

## 2020-03-23 NOTE — ED Notes (Addendum)
Hourly rounding reveals patient in room. No complaints, stable, in no acute distress. Q15 minute rounds and monitoring via Rover and Officer to continue.   

## 2020-03-23 NOTE — ED Notes (Signed)
Patient remains sleeping at this time. Will give 1800 meds when patient awakens.
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
## 2020-03-23 NOTE — ED Provider Notes (Signed)
Emergency Medicine Observation Re-evaluation Note  Scott [name] is a 69 y.o. male, seen on rounds today.  Pt initially presented to the ED for complaints of Psychiatric Evaluation Currently, the patient is resting, voices no medical complaints.  Physical Exam  BP 123/62 (BP Location: Left Arm)   Pulse 99   Temp 97.8 F (36.6 C) (Oral)   Resp 18   Ht 6\' 4"  (1.93 m)   Wt 90 kg   SpO2 96%   BMI 24.15 kg/m  Physical Exam General: Resting in no acute distress Cardiac: No cyanosis Lungs: Equal rise and fall Psych: Not agitated  ED Course / MDM  EKG:EKG Interpretation  Date/Time:  Sunday March 13 2020 17:54:58 EDT Ventricular Rate:  99 PR Interval:  138 QRS Duration: 78 QT Interval:  322 QTC Calculation: 413 R Axis:   35 Text Interpretation: Normal sinus rhythm Septal infarct , age undetermined Abnormal ECG Confirmed by UNCONFIRMED, DOCTOR ([id number]), editor Mel Almond, Tammy [phone]) on 03/15/2020 10:34:28 AM    I have reviewed the labs performed to date as well as medications administered while in observation.  Recent changes in the last 24 hours include no changes overnight.  Plan  Current plan is for social work placement. Patient is not under full IVC at this time.   Paulette Blanch, MD 03/23/20 [phone]

## 2020-03-23 NOTE — TOC Progression Note (Signed)
Transition of Care [facility]) - Progression Note    Patient Details  Name: Scott [name] MRN: [id number] Date of Birth: Oct 02, 1950  Transition [facility]) CM/SW [place], RN Phone Number: 03/23/2020, 1:07 PM  Clinical Narrative:     Widened bedsearch.   Expected Discharge Plan: Against Medical Advice Barriers to Discharge: Continued Medical Work up  Expected Discharge Plan and Services Expected Discharge Plan: Against Medical Advice   Discharge Planning Services: CM Consult Post Acute Care Choice: [place] arrangements for the past 2 months: [place]                                       Social Determinants of Health (SDOH) Interventions    Readmission Risk Interventions No flowsheet data found.

## 2020-03-23 NOTE — Evaluation (Addendum)
Occupational Therapy Evaluation Patient Details Name: Scott [name] MRN: [id number] DOB: 10-25-1950 Today's Date: 03/23/2020    History of Present Illness 69 y.o. male with history of dementia, GERD, schizoaffective disorder, bipolar type, tardive dyskinesia, and hypertnesion brought to hospital with AMS.   Clinical Impression   Patient presenting with decreased I in self care, balance, functional mobility/transfers, endurance, and safety awareness. Pt performed functional tasks with increased time to process and mod multimodal cuing. Pt was not agitated but did appear to be restless and with flat affect.  Patient reports, " I can do it on my own" when asked about self care and mobility PTA. Per chart, pt required assist at times from staff at [facility]. Patient currently functioning at min A for UB self care and mod A for LB self care. Mod A for standing x 2 reps on EOB and mod A for stand pivot transfer into recliner chair. Patient will benefit from acute OT to increase overall independence in the areas of ADLs, functional mobility, and safety awareness in order to safely discharge yo next venue of care.    Follow Up Recommendations  SNF and 24/7 Supervision   Equipment Recommendations  None recommended by OT    Recommendations for Other Services Other (comment) (none at this time)     Precautions / Restrictions Precautions Precautions: Fall      Mobility Bed Mobility Overal bed mobility: Needs Assistance Bed Mobility: Supine to Sit     Supine to sit: Min assist          Transfers Overall transfer level: Needs assistance Equipment used: 1 person hand held assist Transfers: Sit to/from [facility] Sit to Stand: Mod assist Stand pivot transfers: Mod assist       General transfer comment: cuing for hand placement and posture    Balance Overall balance assessment: Needs assistance Sitting-balance support: Feet supported Sitting balance-Leahy  Scale: Good Sitting balance - Comments: no LOB   Standing balance support: During functional activity Standing balance-Leahy Scale: Poor Standing balance comment: reliance on therapist       ADL either performed or assessed with clinical judgement   ADL Overall ADL's : Needs assistance/impaired Eating/Feeding: Set up;Sitting   Grooming: Wash/dry hands;Wash/dry face;Set up;Sitting   Upper Body Bathing: Set up;Sitting   Lower Body Bathing: Minimal assistance;Moderate assistance;Sit to/from stand   Upper Body Dressing : Minimal assistance;Sitting   Lower Body Dressing: Moderate assistance;Sit to/from stand   Toilet Transfer: Moderate assistance             General ADL Comments: Pt sitting on edge of bed and doffed B socks. Pt needing assistance to thread clean socks but able to pull up onto foot. Pt standing with mod A for stand pivot transfer. Pt no fully upright and very crouched posture.     Vision Patient Visual Report: No change from baseline              Pertinent Vitals/Pain Pain Assessment: Faces Faces Pain Scale: No hurt     Hand Dominance Right   Extremity/Trunk Assessment Upper Extremity Assessment Upper Extremity Assessment: Generalized weakness   Lower Extremity Assessment Lower Extremity Assessment: Generalized weakness   Cervical / Trunk Assessment Cervical / Trunk Assessment: Kyphotic   Communication Communication Communication: No difficulties   Cognition Arousal/Alertness: Awake/alert Behavior During Therapy: Restless;Flat affect Overall Cognitive Status: History of cognitive impairments - at baseline    General Comments: Pt verbalized name but did not answer any other orientation questions.  Pt follows 1 step commands with increased time.              Home Living Family/patient expects to be discharged to:: Skilled nursing facility        Additional Comments: [facility]      Prior Functioning/Environment Level of  Independence: Needs assistance  Gait / Transfers Assistance Needed: pt reports, " I do it myself" ADL's / Homemaking Assistance Needed: per chart review he is assisted by staff as needed            OT Problem List: Decreased strength;Decreased activity tolerance;Decreased safety awareness;Decreased knowledge of use of DME or AE;Impaired balance (sitting and/or standing)      OT Treatment/Interventions: Self-care/ADL training;Therapeutic exercise;Therapeutic activities;Energy conservation;DME and/or AE instruction;Patient/family education;Balance training    OT Goals(Current goals can be found in the care plan section) Acute Rehab OT Goals Patient Stated Goal: to get into chair OT Goal Formulation: With patient Time For Goal Achievement: 04/06/20 Potential to Achieve Goals: Good ADL Goals Pt Will Perform Grooming: with modified independence;sitting Pt Will Perform Upper Body Bathing: sitting;with supervision Pt Will Perform Upper Body Dressing: with supervision;sitting Pt Will Transfer to Toilet: with supervision;bedside commode;ambulating  OT Frequency: Min 1X/week   Barriers to D/C:    none known at this time          AM-PAC OT "6 Clicks" Daily Activity     Outcome Measure Help from another person eating meals?: A Little Help from another person taking care of personal grooming?: A Little Help from another person toileting, which includes using toliet, bedpan, or urinal?: Total Help from another person bathing (including washing, rinsing, drying)?: A Lot Help from another person to put on and taking off regular upper body clothing?: A Lot Help from another person to put on and taking off regular lower body clothing?: A Lot 6 Click Score: 13   End of Session Nurse Communication: Mobility status;Other (comment) (pt seated in recliner chair)  Activity Tolerance: Patient tolerated treatment well Patient left: in chair;with call bell/phone within reach  OT Visit Diagnosis:  Muscle weakness (generalized) (M62.81)                Time: 4163-8453 OT Time Calculation (min): 17 min Charges:  OT General Charges $OT Visit: 1 Visit OT Evaluation $OT Eval Low Complexity: 1 Low  Darleen Crocker, MS, OTR/L , CBIS ascom [phone]  03/23/20, 11:06 AM

## 2020-03-23 NOTE — ED Notes (Signed)
Hourly rounding reveals patient in room. No complaints, stable, in no acute distress. Q15 minute rounds and monitoring via Rover and Officer to continue.   

## 2020-03-23 NOTE — ED Notes (Signed)
Patient had 3-person assist to move from recliner to bed. [facility] ED tech changed the patient. Patient remained cooperative during the move. Patient still declined lunch tray.

## 2020-03-23 NOTE — ED Notes (Signed)
Pt incontinent of urine. Pt cleaned and brief changed. [facility]are preformed. Pt assisted from recliner to bed. Pt has no further needs at this time. Will continue to monitor q73min.

## 2020-03-23 NOTE — ED Notes (Signed)
Report given to [facility] RN

## 2020-03-23 NOTE — ED Provider Notes (Signed)
Emergency Medicine Observation Re-evaluation Note  Scott [name] is a 69 y.o. male, seen on rounds today.  Pt initially presented to the ED for complaints of Psychiatric Evaluation Currently, the patient is resting calmly.  Physical Exam  BP 114/63   Pulse 91   Temp 98.9 F (37.2 C) (Oral)   Resp 18   Ht 6\' 4"  (1.93 m)   Wt 90 kg   SpO2 100%   BMI 24.15 kg/m  Physical Exam Vitals and nursing note reviewed.  Constitutional:      Appearance: Normal appearance. He is normal weight.  HENT:     Head: Normocephalic and atraumatic.     Right Ear: External ear normal.     Left Ear: External ear normal.     Nose: Nose normal.  Pulmonary:     Effort: Pulmonary effort is normal. No respiratory distress.  Abdominal:     General: There is no distension.  Psychiatric:        Mood and Affect: Mood normal.      ED Course / MDM  EKG:EKG Interpretation  Date/Time:  Sunday March 13 2020 17:54:58 EDT Ventricular Rate:  99 PR Interval:  138 QRS Duration: 78 QT Interval:  322 QTC Calculation: 413 R Axis:   35 Text Interpretation: Normal sinus rhythm Septal infarct , age undetermined Abnormal ECG Confirmed by UNCONFIRMED, DOCTOR ([id number]), editor Mel Almond, Tammy [phone]) on 03/15/2020 10:34:28 AM    I have reviewed the labs performed to date as well as medications administered while in observation.  Recent changes in the last 24 hours include removal of sliding scale insulin (not needed in several days). IVC reversed by psych. .  Plan  Current plan is for social work to [place].  Patient is not under full IVC at this time.   Lucrezia Starch, MD 03/23/20 2215

## 2020-03-23 NOTE — TOC Initial Note (Signed)
Transition of Care [facility]) - Initial/Assessment Note    Patient Details  Name: Scott [name] MRN: [id number] Date of Birth: 1950/09/24  [facility] CM/SW Contact:    Scott Dike, RN Phone Number: 03/23/2020, 9:16 AM  Clinical Narrative:                  Patient needs LTC placement.  He will not be accepted back at [facility].  FL2 completed and bed search initiated.   Expected Discharge Plan: Against Medical Advice Barriers to Discharge: Continued Medical Work up   Patient Goals and CMS Choice Patient states their goals for this hospitalization and ongoing recovery are:: find new LTC placement CMS Medicare.gov Compare Post Acute Care list provided to:: Legal Guardian Choice offered to / list presented to : [place] / Guardian  Expected Discharge Plan and Services Expected Discharge Plan: Against Medical Advice   Discharge Planning Services: CM Consult Post Acute Care Choice: [place] Living arrangements for the past 2 months: [place]                                      Prior Living Arrangements/Services Living arrangements for the past 2 months: [place] Lives with:: Facility Resident          Need for Family Participation in Patient Care: Yes (Comment) Care giver support system in place?: No (comment)      Activities of Daily Living Home Assistive Devices/Equipment: None ADL Screening (condition at time of admission) Patient's cognitive ability adequate to safely complete daily activities?: Yes Is the patient deaf or have difficulty hearing?: No Does the patient have difficulty seeing, even when wearing glasses/contacts?: No Does the patient have difficulty concentrating, remembering, or making decisions?: No Patient able to express need for assistance with ADLs?: Yes Does the patient have difficulty dressing or bathing?: No Independently performs ADLs?: Yes (appropriate for  developmental age) Does the patient have difficulty walking or climbing stairs?: No Weakness of Legs: None Weakness of Arms/Hands: None  Permission Sought/Granted                  Emotional Assessment              Admission diagnosis:  psych eval Patient Active Problem List   Diagnosis Date Noted  . Generalized weakness 10/14/2017  . Diabetes ([place]) 02/07/2016  . Schizophrenia, undifferentiated ([place]) 01/24/2016  . GERD (gastroesophageal reflux disease) 01/24/2016  . Tardive dyskinesia 01/24/2016  . Chronic hyponatremia 01/24/2016  . Hypertension 01/24/2016   PCP:  Scott Grill, NP Pharmacy:   [facility] [name] [place] [place] [address] Phone: [phone] Fax: [phone]     Social Determinants of Health (SDOH) Interventions    Readmission Risk Interventions No flowsheet data found.

## 2020-03-23 NOTE — ED Notes (Signed)
Pt sitting up and eating independently.

## 2020-03-24 DIAGNOSIS — E871 Hypo-osmolality and hyponatremia: Secondary | ICD-10-CM | POA: Diagnosis not present

## 2020-03-24 NOTE — ED Notes (Signed)
Hourly rounding reveals patient in room. No complaints, stable, in no acute distress. Q15 minute rounds and monitoring via Rover and Officer to continue.   

## 2020-03-24 NOTE — ED Notes (Signed)
VOLUNTARY/awaiting social work placement

## 2020-03-24 NOTE — ED Provider Notes (Signed)
Emergency Medicine Observation Re-evaluation Note  Scott [name] is a 69 y.o. male, seen on rounds today.  Pt initially presented to the ED for complaints of Psychiatric Evaluation Currently, the patient is resting.  Physical Exam  BP 109/61 (BP Location: Right Arm)   Pulse 96   Temp 98.9 F (37.2 C) (Oral)   Resp 18   Ht 1.93 m (6\' 4" )   Wt 90 kg   SpO2 99%   BMI 24.15 kg/m  Physical Exam Gen:  No acute distress Resp:  Breathing easily and comfortably, no accessory muscle usage Neuro:  Moving all four extremities, no gross focal neuro deficits Psych:  Resting currently, calm and cooperative when awake  ED Course / MDM  EKG:EKG Interpretation  Date/Time:  Sunday March 13 2020 17:54:58 EDT Ventricular Rate:  99 PR Interval:  138 QRS Duration: 78 QT Interval:  322 QTC Calculation: 413 R Axis:   35 Text Interpretation: Normal sinus rhythm Septal infarct , age undetermined Abnormal ECG Confirmed by UNCONFIRMED, DOCTOR ([id number]), editor Mel Almond, Tammy [phone]) on 03/15/2020 10:34:28 AM    I have reviewed the labs performed to date as well as medications administered while in observation.  Recent changes in the last 24 hours include being released from IVC (a couple of days ago) by Dr. Weber Cooks but continued to have disposition difficulty plans through social work.  Plan  Current plan is for social work disposition. Patient is not under full IVC at this time.   Hinda Kehr, MD 03/24/20 [phone]

## 2020-03-24 NOTE — TOC Progression Note (Signed)
Transition of Care [facility]) - Progression Note    Patient Details  Name: ULES MARSALA MRN: [id number] Date of Birth: Jan 07, 1951  Transition of Care [facility]) CM/SW [place], [place] Phone Number: [phone] 03/24/2020, 12:34 PM  Clinical Narrative:     Patient has been accepted by [facility].  CSW contacted [facility] admission coordinator at [phone] for confirmation.  Patient will need 3 day waiver approval for transfer to SNF.   Expected Discharge Plan: Against Medical Advice Barriers to Discharge: Continued Medical Work up  Expected Discharge Plan and Services Expected Discharge Plan: Against Medical Advice   Discharge Planning Services: CM Consult Post Acute Care Choice: [place] arrangements for the past 2 months: [place]                                       Social Determinants of Health (SDOH) Interventions    Readmission Risk Interventions No flowsheet data found.

## 2020-03-24 NOTE — ED Notes (Signed)
Pt cleaned and repositioned in bed by this RN and Claiborne Billings, Therapist, sports. New brief and bed sheet applied. Peri-care performed by this RN

## 2020-03-24 NOTE — ED Notes (Signed)
Pt given meal tray at this time. Pt assisted with tray set-up, but patient able to feed self.

## 2020-03-24 NOTE — ED Notes (Signed)
Pt cleaned and brief changed

## 2020-03-24 NOTE — Progress Notes (Addendum)
Physical Therapy Treatment Patient Details Name: Scott [name] MRN: [id number] DOB: 02-12-1951 Today's Date: 03/24/2020    History of Present Illness 25 yoM who came to [facility] on 10/10 from [facility] after aggressive action against staff. PMH: schizophrenia, seizure d/o, chronic hyponatremia, dementia, GERD, BPD, tardive dyskinesia, HTN. Pt here for similar problem on 02/16/20.    PT Comments    Pt continues to be minimally interactive, but was willing to try and work with PT a little.  After standing bout, however, PT encouraged him to participate with some supine bed exercises however he grunted no and pulled sheets over his head.  Pt was able to manage a few labored, unsteady, side shuffling steps at EOB with assist, but had poor tolerance, needed heavy cuing and struggled to do anything functional in standing.    Follow Up Recommendations  SNF, Supervision for mobility/OOB;Supervision/Assistance - 24 hour     Equipment Recommendations  None recommended by PT    Recommendations for Other Services       Precautions / Restrictions Precautions Precautions: Fall Restrictions Weight Bearing Restrictions: No    Mobility  Bed Mobility Overal bed mobility: Needs Assistance Bed Mobility: Sit to Supine     Supine to sit: Mod assist Sit to supine: Mod assist   General bed mobility comments: Pt unable to organize getting himself to EOB, needed heavy use of PT's hand to help pull up to sitting  Transfers Overall transfer level: Needs assistance Equipment used: Rolling walker (2 wheeled) Transfers: Sit to/from Stand Sit to Stand: Mod assist         General transfer comment: Pt attempted to get up w/o assist unsuccessfully, needed considerable assist and cuing to make it happen and maintain appropriate walker use  Ambulation/Gait             General Gait Details: not able to ambulate, with very heavy assist he managed 2 small side shuffling steps, but  unable to maintain balance w/o constant mod assist and "crashed" back to the bed trying to manage more side stepping   Stairs             Wheelchair Mobility    Modified Rankin (Stroke Patients Only)       Balance Overall balance assessment: Needs assistance Sitting-balance support: Feet supported Sitting balance-Leahy Scale: Good     Standing balance support: During functional activity Standing balance-Leahy Scale: Poor Standing balance comment: reliant on the walker and unable to make adjustments with cuing                            Cognition Arousal/Alertness: Awake/alert Behavior During Therapy: Flat affect Overall Cognitive Status: History of cognitive impairments - at baseline                                        Exercises      General Comments        Pertinent Vitals/Pain Pain Assessment: No/denies pain Faces Pain Scale: No hurt    Home Living                      Prior Function            PT Goals (current goals can now be found in the care plan section) Progress towards PT goals: Progressing toward goals  Frequency    Min 2X/week      PT Plan Current plan remains appropriate    Co-evaluation              AM-PAC PT "6 Clicks" Mobility   Outcome Measure  Help needed turning from your back to your side while in a flat bed without using bedrails?: A Lot Help needed moving from lying on your back to sitting on the side of a flat bed without using bedrails?: Total Help needed moving to and from a bed to a chair (including a wheelchair)?: Total Help needed standing up from a chair using your arms (e.g., wheelchair or bedside chair)?: Total Help needed to walk in hospital room?: Total Help needed climbing 3-5 steps with a railing? : Total 6 Click Score: 7    End of Session Equipment Utilized During Treatment: Gait belt Activity Tolerance: Patient limited by fatigue Patient left: in  bed Nurse Communication: Mobility status PT Visit Diagnosis: Other abnormalities of gait and mobility (R26.89);Muscle weakness (generalized) (M62.81)     Time: 7588-3254 PT Time Calculation (min) (ACUTE ONLY): 17 min  Charges:  $Therapeutic Activity: 8-22 mins                     Kreg Shropshire, DPT 03/24/2020, 12:34 PM

## 2020-03-24 NOTE — ED Notes (Signed)
Pt adjusted in bed and meal tray set up for patient. Pt eating at this time.

## 2020-03-25 DIAGNOSIS — E871 Hypo-osmolality and hyponatremia: Secondary | ICD-10-CM | POA: Diagnosis not present

## 2020-03-25 NOTE — TOC Progression Note (Signed)
Transition of Care [facility]) - Progression Note    Patient Details  Name: Scott [name] MRN: [id number] Date of Birth: 05-Dec-1950  Transition of Care [facility]) CM/SW [place], [place] Phone Number: [phone] 03/25/2020, 11:51 AM  Clinical Narrative:     CSW left voicemail for Sherrie at [place] for status on bed placement.   Expected Discharge Plan: Against Medical Advice Barriers to Discharge: Continued Medical Work up  Expected Discharge Plan and Services Expected Discharge Plan: Against Medical Advice   Discharge Planning Services: CM Consult Post Acute Care Choice: [place] arrangements for the past 2 months: [place]                                       Social Determinants of Health (SDOH) Interventions    Readmission Risk Interventions No flowsheet data found.

## 2020-03-25 NOTE — ED Provider Notes (Signed)
Emergency Medicine Observation Re-evaluation Note  Scott [name] is a 69 y.o. male, seen on rounds today.  Pt initially presented to the ED for complaints of Psychiatric Evaluation Currently, the patient is sleeping.  Physical Exam  BP 128/66 (BP Location: Left Arm)   Pulse 92   Temp 98.3 F (36.8 C) (Oral)   Resp 20   Ht 1.93 m (6\' 4" )   Wt 90 kg   SpO2 99%   BMI 24.15 kg/m  Physical Exam Gen:  No acute distress Resp:  Breathing easily and comfortably, no accessory muscle usage Neuro:  Moving all four extremities, no gross focal neuro deficits Psych:  Resting currently, calm and cooperative when awake ED Course / MDM  EKG:   I have reviewed the labs performed to date as well as medications administered while in observation.  Recent changes in the last 24 hours include placement at a skilled nursing facility.  Plan  Current plan is for transfer to a skilled nursing facility at the next available opportunity. Patient is not under full IVC at this time.   Hinda Kehr, MD 03/25/20 [phone]

## 2020-03-25 NOTE — TOC Progression Note (Signed)
Transition of Care [facility]) - Progression Note    Patient Details  Name: Scott [name] MRN: [id number] Date of Birth: [birth year]  Transition of Care [facility]) CM/SW [place], [place] Phone Number: [phone] 03/25/2020, 3:34 PM  Clinical Narrative:     CSW spoke with Otila Kluver [phone] at [facility] to confirm bed offer.  CSW asked about the three night stay waiver and Otila Kluver state she would need to verify with her leadership and call this CSW with a reply.  Expected Discharge Plan: Against Medical Advice Barriers to Discharge: Continued Medical Work up  Expected Discharge Plan and Services Expected Discharge Plan: Against Medical Advice   Discharge Planning Services: CM Consult Post Acute Care Choice: [place] arrangements for the past 2 months: [place]                                       Social Determinants of Health (SDOH) Interventions    Readmission Risk Interventions No flowsheet data found.

## 2020-03-25 NOTE — ED Notes (Signed)
Pt's gown, sheet and pad changed.

## 2020-03-25 NOTE — ED Notes (Signed)
Pt cleaned up, brief and chuck pad replaced.

## 2020-03-26 DIAGNOSIS — E871 Hypo-osmolality and hyponatremia: Secondary | ICD-10-CM | POA: Diagnosis not present

## 2020-03-26 LAB — GLUCOSE, CAPILLARY: Glucose-Capillary: 97 mg/dL (ref 70–99)

## 2020-03-26 MED ORDER — ENSURE ENLIVE PO LIQD
237.0000 mL | Freq: Three times a day (TID) | ORAL | Status: DC
  Administered 2020-03-27 – 2020-03-28 (×5): 237 mL via ORAL

## 2020-03-26 NOTE — ED Notes (Signed)
Patient is vol,pending placement

## 2020-03-26 NOTE — ED Provider Notes (Signed)
Emergency Medicine Observation Re-evaluation Note  Scott [name] is a 69 y.o. male, seen on rounds today.  Pt initially presented to the ED for complaints of Psychiatric Evaluation Currently, the patient is resting comfortably  Physical Exam  BP 121/87 (BP Location: Right Arm)   Pulse 87   Temp 98 F (36.7 C) (Oral)   Resp 16   Ht 6\' 4"  (1.93 m)   Wt 90 kg   SpO2 97%   BMI 24.15 kg/m  Physical Exam General: resting Lungs: normal respiratory effort Psych: calm  ED Course / MDM   I have reviewed the labs performed to date as well as medications administered while in observation.   Plan  Current plan is for placement. Patient is not under full IVC at this time.   Nance Pear, MD 03/26/20 [phone]

## 2020-03-26 NOTE — ED Notes (Signed)
Pt ate about 15% of dinner tray.

## 2020-03-26 NOTE — ED Notes (Signed)
Pt cleansed of incontinent urine and placed in a new brief

## 2020-03-26 NOTE — ED Notes (Signed)
Pt repositioned

## 2020-03-27 DIAGNOSIS — E871 Hypo-osmolality and hyponatremia: Secondary | ICD-10-CM | POA: Diagnosis not present

## 2020-03-27 LAB — BASIC METABOLIC PANEL
Anion gap: 13 (ref 5–15)
Anion gap: 10 (ref 5–15)
BUN: 44 mg/dL — ABNORMAL HIGH (ref 8–23)
BUN: 39 mg/dL — ABNORMAL HIGH (ref 8–23)
CO2: 21 mmol/L — ABNORMAL LOW (ref 22–32)
CO2: 22 mmol/L (ref 22–32)
Calcium: 9.4 mg/dL (ref 8.9–10.3)
Calcium: 9.1 mg/dL (ref 8.9–10.3)
Chloride: 101 mmol/L (ref 98–111)
Chloride: 103 mmol/L (ref 98–111)
Creatinine, Ser: 1.14 mg/dL (ref 0.61–1.24)
Creatinine, Ser: 0.92 mg/dL (ref 0.61–1.24)
GFR, Estimated: >60 mL/min (ref >60)
GFR, Estimated: >60 mL/min (ref >60)
Glucose, Bld: 208 mg/dL — ABNORMAL HIGH (ref 70–99)
Glucose, Bld: 157 mg/dL — ABNORMAL HIGH (ref 70–99)
Potassium: 6.2 mmol/L — ABNORMAL HIGH (ref 3.5–5.1)
Potassium: 5.1 mmol/L (ref 3.5–5.1)
Sodium: 135 mmol/L (ref 135–145)
Sodium: 135 mmol/L (ref 135–145)

## 2020-03-27 LAB — GLUCOSE, CAPILLARY: Glucose-Capillary: 139 mg/dL — ABNORMAL HIGH (ref 70–99)

## 2020-03-27 MED ORDER — LACTATED RINGERS IV BOLUS
1000.0000 mL | Freq: Once | INTRAVENOUS | Status: AC
  Administered 2020-03-27: 1000 mL via INTRAVENOUS

## 2020-03-27 NOTE — ED Notes (Signed)
EMR clean up att for CBG POC

## 2020-03-27 NOTE — ED Notes (Signed)
Pt has seemed to get weaker over the day. Pt drooling and is getting more difficult to make him take him pills. Pt does not want his dinner when usually he eats a full meal. Pt has only urinated x2 today. Orders obtained from Dr. Tamala Julian.

## 2020-03-27 NOTE — ED Notes (Signed)
Per Dr. Kerman Passey, no cbg correction needed at this time.

## 2020-03-27 NOTE — ED Notes (Signed)
Pt threw soiled brief on floor. Given new one. Sheets dry at this time.

## 2020-03-27 NOTE — ED Notes (Signed)
Purple, lactic, and green sent to lab

## 2020-03-27 NOTE — ED Notes (Signed)
Able to understand speech better att, reports middle name as "Reg" reports birthday as "[birth year]" and birth year as "[birth year]"

## 2020-03-27 NOTE — ED Notes (Signed)
Pt asking for phone. Informed patient it is not phone time. Patient mumbling at this time. When asked if wet, hungry or thirsty, patient states "no I'm not". Patient raising voice "get the goddamn phone". This RN asked patient not to speak to staff this way. Patient continues to yell then mumble after RN leaves room.

## 2020-03-27 NOTE — ED Notes (Signed)
Pt unable to give name or birthday, speech unintelligible, mouth care performed and no inprovment

## 2020-03-27 NOTE — ED Notes (Signed)
Pt given meal tray and assisted to sit up. Set up provided at bedside table. Pt abel to feed self.

## 2020-03-27 NOTE — ED Notes (Signed)
Pt diaper wet, pt cleaned and changed, new diaper and chux in place, pt has pressure dressing on sacrum, no skin issues noted

## 2020-03-27 NOTE — ED Provider Notes (Signed)
Emergency Medicine Observation Re-evaluation Note  Scott [name] is a 69 y.o. male, seen on rounds today.  Pt initially presented to the ED for complaints of Psychiatric Evaluation Currently, the patient is resting, voices no medical complaints.  Physical Exam  BP (!) 141/84 (BP Location: Right Arm)   Pulse 91   Temp 98.1 F (36.7 C) (Oral)   Resp 18   Ht 6\' 4"  (1.93 m)   Wt 90 kg   SpO2 100%   BMI 24.15 kg/m  Physical Exam General: Resting in no acute distress Cardiac: No cyanosis Lungs: Equal rise and fall Psych: Not agitated  ED Course / MDM  EKG:EKG Interpretation  Date/Time:  Sunday March 13 2020 17:54:58 EDT Ventricular Rate:  99 PR Interval:  138 QRS Duration: 78 QT Interval:  322 QTC Calculation: 413 R Axis:   35 Text Interpretation: Normal sinus rhythm Septal infarct , age undetermined Abnormal ECG Confirmed by UNCONFIRMED, DOCTOR ([id number]), editor Mel Almond, Tammy [phone]) on 03/15/2020 10:34:28 AM    I have reviewed the labs performed to date as well as medications administered while in observation.  Recent changes in the last 24 hours include no events overnight.  Plan  Current plan is for social work placement. Patient is not under full IVC at this time.   Paulette Blanch, MD 03/27/20 [phone]

## 2020-03-27 NOTE — ED Notes (Signed)
Pt finished drinking ensure, no sugar ice tea, and diet sprite, pt declined to eat any of meal tray of any snack

## 2020-03-28 DIAGNOSIS — E871 Hypo-osmolality and hyponatremia: Secondary | ICD-10-CM | POA: Diagnosis not present

## 2020-03-28 LAB — RESPIRATORY PANEL BY RT PCR (FLU A&B, COVID)
Influenza A by PCR: NEGATIVE
Influenza B by PCR: NEGATIVE
SARS Coronavirus 2 by RT PCR: NEGATIVE

## 2020-03-28 MED ORDER — CLONAZEPAM 0.5 MG PO TABS: 0.5000 mg | ORAL_TABLET | Freq: Two times a day (BID) | ORAL | 0 refills | Status: AC

## 2020-03-28 MED ORDER — FAMOTIDINE 20 MG PO TABS: 20.0000 mg | ORAL_TABLET | Freq: Every day | ORAL | 0 refills | Status: AC

## 2020-03-28 MED ORDER — HALOPERIDOL 2 MG PO TABS: 2.0000 mg | ORAL_TABLET | Freq: Every day | ORAL | 0 refills | Status: AC

## 2020-03-28 MED ORDER — HALOPERIDOL 5 MG PO TABS: 5.0000 mg | ORAL_TABLET | Freq: Every day | ORAL | 0 refills | Status: AC

## 2020-03-28 MED ORDER — INSULIN GLARGINE 100 UNIT/ML ~~LOC~~ SOLN: SUBCUTANEOUS | 3 refills | Status: AC

## 2020-03-28 MED ORDER — BENZTROPINE MESYLATE 1 MG PO TABS: 1.0000 mg | ORAL_TABLET | Freq: Two times a day (BID) | ORAL | 0 refills | Status: AC

## 2020-03-28 MED ORDER — DIVALPROEX SODIUM 500 MG PO DR TAB
500.0000 mg | DELAYED_RELEASE_TABLET | Freq: Two times a day (BID) | ORAL | 0 refills | Status: DC
Start: 2020-03-28 — End: 2020-04-19

## 2020-03-28 MED ORDER — METFORMIN HCL 1000 MG PO TABS: 1000.0000 mg | ORAL_TABLET | Freq: Two times a day (BID) | ORAL | 0 refills | Status: AC

## 2020-03-28 MED ORDER — TRAZODONE HCL 150 MG PO TABS: 150.0000 mg | ORAL_TABLET | Freq: Every day | ORAL | 0 refills | Status: AC

## 2020-03-28 MED ORDER — OLANZAPINE 15 MG PO TABS: 15.0000 mg | ORAL_TABLET | Freq: Two times a day (BID) | ORAL | 0 refills | Status: AC

## 2020-03-28 MED ORDER — TAMSULOSIN HCL 0.4 MG PO CAPS: 0.4000 mg | ORAL_CAPSULE | Freq: Every day | ORAL | 0 refills | Status: AC

## 2020-03-28 MED ORDER — ATORVASTATIN CALCIUM 40 MG PO TABS: 40.0000 mg | ORAL_TABLET | Freq: Every day | ORAL | 0 refills | Status: AC

## 2020-03-28 NOTE — ED Notes (Signed)
Patient dry at this time.

## 2020-03-28 NOTE — ED Notes (Signed)
Patient incontinent of bladder. Peri care provided, clean/dry change of clothes and depends applied.

## 2020-03-28 NOTE — ED Notes (Signed)
Cold water given to patient at this time

## 2020-03-28 NOTE — ED Provider Notes (Signed)
DISCHARGE SUMMARY:  Patient was initially seen in the emergency department 03/13/2020 for aggressive behavior at his nursing facility. At that time the nursing facility did not believe that they could adequately handle the patient due to the aggressive behavior. Patient was seen both medically and by psychiatry throughout his hospital stay. Psychiatry had recommended several medication changes:  After medication changes patient became much more cooperative. Patient has remained calm and cooperative and much more redirectable throughout his stay in the emergency department on his new medications.   Agitation: -Changed Depakote 750 mg at bedtime to 500 mg BID -Increased Haldol 5 mg at bedtime to 2 mg in the am -changed Zyprexa 5 mg in the am and 20 mg in the pm to 15 mg BID  EPS: -Restarted Cogentin 1 mg BID  Insomnia: -Continued Trazodone 150 mg at bedtime  Disposition:  Skilled Facility   Diagnosis: Schizophrenia  Aggressive Behavior   Harvest Dark, MD 03/28/20 1529

## 2020-03-28 NOTE — ED Notes (Signed)
Patient dried at this time

## 2020-03-28 NOTE — ED Notes (Addendum)
Pt heard moaning; diaper checked dry, pt still sleepy; negative needs vocalized pt returned to sleep

## 2020-03-28 NOTE — ED Notes (Signed)
PT consult completed this morning.

## 2020-03-28 NOTE — ED Provider Notes (Signed)
Emergency Medicine Observation Re-evaluation Note  Scott [name] is a 69 y.o. male, seen on rounds today.  Pt initially presented to the ED for complaints of Psychiatric Evaluation Currently, the patient is resting, voices no medical complaints.  Physical Exam  BP 124/75   Pulse 96   Temp 98.3 F (36.8 C) (Oral)   Resp 19   Ht 6\' 4"  (1.93 m)   Wt 90 kg   SpO2 98%   BMI 24.15 kg/m  Physical Exam General: Resting in no acute distress Cardiac: No cyanosis Lungs: Equal rise and fall Psych: Not agitated  ED Course / MDM  EKG:EKG Interpretation  Date/Time:  Sunday March 13 2020 17:54:58 EDT Ventricular Rate:  99 PR Interval:  138 QRS Duration: 78 QT Interval:  322 QTC Calculation: 413 R Axis:   35 Text Interpretation: Normal sinus rhythm Septal infarct , age undetermined Abnormal ECG Confirmed by UNCONFIRMED, DOCTOR ([id number]), editor Mel Almond, Tammy [phone]) on 03/15/2020 10:34:28 AM    I have reviewed the labs performed to date as well as medications administered while in observation.  Recent changes in the last 24 hours include patient not eating well or drinking; had repeat blood work which was unremarkable and IV fluids with good effect.  Plan  Current plan is for social work placement. Patient is not under full IVC at this time.   Paulette Blanch, MD 03/28/20 0630

## 2020-03-28 NOTE — TOC Transition Note (Signed)
Transition of Care [facility]) - CM/SW Discharge Note   Patient Details  Name: Scott [name] MRN: [id number] Date of Birth: 1951/01/23  [facility] Care [facility] CM/SW Contact:  Victorino Dike, RN Phone Number: 03/28/2020, 4:20 PM   Clinical Narrative:     Patient to transfer to [facility] today by [facility].  DSS SW called [name]. No further TOC needs at this time, please re-consult for new needs.    Final next level of care: Skilled Nursing Facility Barriers to Discharge: Continued Medical Work up   Patient Goals and CMS Choice Patient states their goals for this hospitalization and ongoing recovery are:: find new LTC placement CMS Medicare.gov Compare Post Acute Care list provided to:: Legal Guardian Choice offered to / list presented to : [place] / [place]  Discharge Placement              Patient chooses bed at: Other - please specify in the comment section below: [facility]) Patient to be transferred to facility by: [facility]      Discharge Plan and Services   Discharge Planning Services: CM Consult Post Acute Care Choice: [place]                               Social Determinants of Health (SDOH) Interventions     Readmission Risk Interventions No flowsheet data found.

## 2020-03-28 NOTE — ED Notes (Signed)
OT and PT in the room with patient helping with bathing and dressing

## 2020-03-28 NOTE — Discharge Summary (Deleted)
DISCHARGE SUMMARY:  Patient was initially seen in the emergency department 03/13/2020 for aggressive behavior at his nursing facility.  At that time the nursing facility did not believe that they could adequately handle the patient due to the aggressive behavior.  Patient was seen both medically and by psychiatry throughout his hospital stay.  Psychiatry had recommended several medication changes:  After medication changes patient became much more cooperative.  Patient has remained calm and cooperative and much more redirectable throughout his stay in the emergency department on his new medications.   Agitation: -Changed Depakote 750 mg at bedtime to 500 mg BID -Increased Haldol 5 mg at bedtime to 2 mg in the am -changed Zyprexa 5 mg in the am and 20 mg in the pm to 15 mg BID  EPS: -Restarted Cogentin 1 mg BID  Insomnia: -Continued Trazodone 150 mg at bedtime  Disposition:   Skilled Facility   Diagnosis: Schizophrenia  Aggressive Behavior

## 2020-03-28 NOTE — ED Notes (Signed)
Transport here for patient. Patient going back to nursing home.

## 2020-03-28 NOTE — ED Notes (Signed)
Report given to receiving nurse

## 2020-03-28 NOTE — Progress Notes (Signed)
Physical Therapy Treatment Patient Details Name: Scott [name] MRN: [id number] DOB: 1951-06-03 Today's Date: 03/28/2020    History of Present Illness 89 yoM who came to [facility] on 10/10 from [facility] after aggressive action against staff. PMH: schizophrenia, seizure d/o, chronic hyponatremia, dementia, GERD, BPD, tardive dyskinesia, HTN. Pt here for similar problem on 02/16/20.    PT Comments    Co-tx with OT.  1 unit billed each discipline per protocol.    Pt asleep in bed with blankets over head.  Awoke with verbal and tactile stim.  Pt with poor participation in exercises.  During activity he is noted to be incontinent of urine.  Mostly max A/depenant for care and linen change.  RN in and stated he has been able to stand briefly with her with time and encouragement but given his participation in session today it was deferred for pt and staff safety.  Did note during chart review some reports of increased weakness over the week-end.  Unable to fully assess today given lack of participation and at times resistance to activity.   Follow Up Recommendations  Supervision for mobility/OOB;Supervision/Assistance - 24 hour;SNF     Equipment Recommendations  None recommended by PT    Recommendations for Other Services       Precautions / Restrictions Precautions Precautions: Fall Restrictions Weight Bearing Restrictions: No    Mobility  Bed Mobility Overal bed mobility: Needs Assistance Bed Mobility: Rolling Rolling: Max assist;+2 for physical assistance            Transfers                    Ambulation/Gait                 Stairs             Wheelchair Mobility    Modified Rankin (Stroke Patients Only)       Balance                                            Cognition Arousal/Alertness: Awake/alert Behavior During Therapy: Flat affect Overall Cognitive Status: History of cognitive impairments - at  baseline                                 General Comments: Pt initially very tired but with cues wakes, mumbles, requires cues and additional time to initiate/follow commands      Exercises Other Exercises Other Exercises: BLE PROM with little to no assist from pt    General Comments        Pertinent Vitals/Pain Pain Assessment: No/denies pain    Home Living                      Prior Function            PT Goals (current goals can now be found in the care plan section) Progress towards PT goals: Not progressing toward goals - comment    Frequency    Min 2X/week      PT Plan Current plan remains appropriate    Co-evaluation PT/OT/SLP Co-Evaluation/Treatment: Yes Reason for Co-Treatment: Complexity of the patient's impairments (multi-system involvement);For patient/therapist safety;To address functional/ADL transfers PT goals addressed during session: Mobility/safety with mobility OT goals addressed during session: ADL's  and self-care      AM-PAC PT "6 Clicks" Mobility   Outcome Measure  Help needed turning from your back to your side while in a flat bed without using bedrails?: A Lot Help needed moving from lying on your back to sitting on the side of a flat bed without using bedrails?: Total Help needed moving to and from a bed to a chair (including a wheelchair)?: Total Help needed standing up from a chair using your arms (e.g., wheelchair or bedside chair)?: Total Help needed to walk in hospital room?: Total Help needed climbing 3-5 steps with a railing? : Total 6 Click Score: 7    End of Session Equipment Utilized During Treatment: Gait belt Activity Tolerance: Patient tolerated treatment well Patient left: in bed;with call bell/phone within reach;with bed alarm set Nurse Communication: Mobility status       Time: 0867-6195 PT Time Calculation (min) (ACUTE ONLY): 24 min  Charges:  $Therapeutic Exercise: 8-22 mins                     Chesley Noon, PTA 03/28/20, 1:42 PM

## 2020-03-28 NOTE — Progress Notes (Signed)
Occupational Therapy Treatment Patient Details Name: Scott [name] MRN: [id number] DOB: Feb 07, 1951 Today's Date: 03/28/2020    History of present illness 81 yoM who came to [facility] on 10/10 from [facility] after aggressive action against staff. PMH: schizophrenia, seizure d/o, chronic hyponatremia, dementia, GERD, BPD, tardive dyskinesia, HTN. Pt here for similar problem on 02/16/20.   OT comments  Pt seen for OT/PT co-tx this date. Pt initially found sleeping, blankets covering him head to toes. Requires initial verbal and tactile cues to wake up and alert to session. Pt provided with cloth to wash his face ultimately requiring Mod A to complete. Pt participated in bed level bathing, dressing, and bed mobility for linens change after found to be soiled. Initially requiring maximum cues for engagement and ultimately max A x2 to complete. Pt demonstrated decreased participation this date, requiring increased encouragement and physical assist to perform. Pt continues to benefit from skilled OT services to maximize return to PLOF and minimize caregiver burden. Continue to recommend SNF vs geri psych placement.    Follow Up Recommendations  SNF;Other (comment) (geri psych placement if available)    Equipment Recommendations  None recommended by OT    Recommendations for Other Services      Precautions / Restrictions Precautions Precautions: Fall Restrictions Weight Bearing Restrictions: No       Mobility Bed Mobility Overal bed mobility: Needs Assistance Bed Mobility: Rolling Rolling: Max assist;+2 for physical assistance            Transfers                      Balance                                           ADL either performed or assessed with clinical judgement   ADL Overall ADL's : Needs assistance/impaired     Grooming: Moderate assistance;Bed level Grooming Details (indicate cue type and reason): limited engagement,  requiring initial hand over hand, requiring mod A to complete                               General ADL Comments: Pt demonstrated decreased active participation in session this date requiring +2 Max A for bed level bathing, dressing, and linens change after pt noted to be soiled     Vision Patient Visual Report: No change from baseline     Perception     Praxis      Cognition Arousal/Alertness: Awake/alert Behavior During Therapy: Flat affect Overall Cognitive Status: History of cognitive impairments - at baseline                                 General Comments: Pt initially very tired but with cues wakes, mumbles, requires cues and additional time to initiate/follow commands        Exercises Other Exercises Other Exercises: Pt required significant assist for rolling in bed to facilitate bed level bathing, dressing, and linens change after pt noted to be soiled; cues required to follow commands   Shoulder Instructions       General Comments      Pertinent Vitals/ Pain       Pain Assessment: No/denies pain  Home Living  Prior Functioning/Environment              Frequency  Min 1X/week        Progress Toward Goals  OT Goals(current goals can now be found in the care plan section)  Progress towards OT goals: OT to reassess next treatment  Acute Rehab OT Goals Patient Stated Goal: to get into chair OT Goal Formulation: With patient Time For Goal Achievement: 04/06/20 Potential to Achieve Goals: Good  Plan Discharge plan remains appropriate;Frequency remains appropriate    Co-evaluation    PT/OT/SLP Co-Evaluation/Treatment: Yes Reason for Co-Treatment: For patient/therapist safety PT goals addressed during session: Mobility/safety with mobility OT goals addressed during session: ADL's and self-care      AM-PAC OT "6 Clicks" Daily Activity     Outcome Measure    Help from another person eating meals?: A Little Help from another person taking care of personal grooming?: A Lot Help from another person toileting, which includes using toliet, bedpan, or urinal?: Total Help from another person bathing (including washing, rinsing, drying)?: A Lot Help from another person to put on and taking off regular upper body clothing?: A Lot Help from another person to put on and taking off regular lower body clothing?: A Lot 6 Click Score: 12    End of Session    OT Visit Diagnosis: Muscle weakness (generalized) (M62.81)   Activity Tolerance Patient tolerated treatment well   Patient Left in bed;with call bell/phone within reach;with bed alarm set   Nurse Communication Mobility status (bathed, linens changed)        Time: 6384-6659 OT Time Calculation (min): 24 min  Charges: OT General Charges $OT Visit: 1 Visit OT Treatments $Self Care/Home Management : 8-22 mins  Jeni Salles, MPH, MS, OTR/L ascom [phone] 03/28/20, 9:35 AM

## 2020-04-18 ENCOUNTER — Inpatient Hospital Stay (HOSPITAL_COMMUNITY)
Admission: EM | Admit: 2020-04-18 | Discharge: 2020-04-22 | DRG: 640 | Disposition: A | Discharge status: Skilled Nursing Facility | Payer: Medicare Other | Source: Skilled Nursing Facility | Attending: Internal Medicine | Admitting: Internal Medicine

## 2020-04-18 ENCOUNTER — Emergency Department (HOSPITAL_COMMUNITY): Payer: Medicare Other

## 2020-04-18 ENCOUNTER — Encounter (HOSPITAL_COMMUNITY): Payer: Self-pay

## 2020-04-18 DIAGNOSIS — G9341 Metabolic encephalopathy: Secondary | ICD-10-CM | POA: Diagnosis present

## 2020-04-18 DIAGNOSIS — E860 Dehydration: Principal | ICD-10-CM

## 2020-04-18 DIAGNOSIS — I700 Atherosclerosis of aorta: Secondary | ICD-10-CM | POA: Diagnosis present

## 2020-04-18 DIAGNOSIS — R410 Disorientation, unspecified: Secondary | ICD-10-CM

## 2020-04-18 DIAGNOSIS — K219 Gastro-esophageal reflux disease without esophagitis: Secondary | ICD-10-CM | POA: Diagnosis present

## 2020-04-18 DIAGNOSIS — F0390 Unspecified dementia without behavioral disturbance: Secondary | ICD-10-CM | POA: Diagnosis present

## 2020-04-18 DIAGNOSIS — E559 Vitamin D deficiency, unspecified: Secondary | ICD-10-CM | POA: Diagnosis present

## 2020-04-18 DIAGNOSIS — E119 Type 2 diabetes mellitus without complications: Secondary | ICD-10-CM

## 2020-04-18 DIAGNOSIS — D519 Vitamin B12 deficiency anemia, unspecified: Secondary | ICD-10-CM

## 2020-04-18 DIAGNOSIS — N179 Acute kidney failure, unspecified: Secondary | ICD-10-CM | POA: Diagnosis present

## 2020-04-18 DIAGNOSIS — K567 Ileus, unspecified: Secondary | ICD-10-CM | POA: Diagnosis present

## 2020-04-18 DIAGNOSIS — Z9049 Acquired absence of other specified parts of digestive tract: Secondary | ICD-10-CM

## 2020-04-18 DIAGNOSIS — I10 Essential (primary) hypertension: Secondary | ICD-10-CM | POA: Diagnosis present

## 2020-04-18 DIAGNOSIS — F203 Undifferentiated schizophrenia: Secondary | ICD-10-CM | POA: Diagnosis present

## 2020-04-18 DIAGNOSIS — E872 Acidosis: Secondary | ICD-10-CM | POA: Diagnosis present

## 2020-04-18 DIAGNOSIS — D630 Anemia in neoplastic disease: Secondary | ICD-10-CM | POA: Diagnosis present

## 2020-04-18 DIAGNOSIS — R400 Somnolence: Secondary | ICD-10-CM

## 2020-04-18 DIAGNOSIS — F17210 Nicotine dependence, cigarettes, uncomplicated: Secondary | ICD-10-CM | POA: Diagnosis present

## 2020-04-18 DIAGNOSIS — C7951 Secondary malignant neoplasm of bone: Secondary | ICD-10-CM | POA: Diagnosis present

## 2020-04-18 DIAGNOSIS — E875 Hyperkalemia: Secondary | ICD-10-CM | POA: Diagnosis present

## 2020-04-18 DIAGNOSIS — Z85038 Personal history of other malignant neoplasm of large intestine: Secondary | ICD-10-CM

## 2020-04-18 DIAGNOSIS — Z20822 Contact with and (suspected) exposure to covid-19: Secondary | ICD-10-CM | POA: Diagnosis present

## 2020-04-18 DIAGNOSIS — G2401 Drug induced subacute dyskinesia: Secondary | ICD-10-CM | POA: Diagnosis present

## 2020-04-18 DIAGNOSIS — C787 Secondary malignant neoplasm of liver and intrahepatic bile duct: Secondary | ICD-10-CM

## 2020-04-18 DIAGNOSIS — E861 Hypovolemia: Secondary | ICD-10-CM | POA: Diagnosis present

## 2020-04-18 DIAGNOSIS — N400 Enlarged prostate without lower urinary tract symptoms: Secondary | ICD-10-CM | POA: Diagnosis present

## 2020-04-18 DIAGNOSIS — T50905A Adverse effect of unspecified drugs, medicaments and biological substances, initial encounter: Secondary | ICD-10-CM | POA: Diagnosis present

## 2020-04-18 DIAGNOSIS — Z794 Long term (current) use of insulin: Secondary | ICD-10-CM

## 2020-04-18 LAB — COMPREHENSIVE METABOLIC PANEL
ALT: 26 U/L (ref 0–44)
AST: 46 U/L — ABNORMAL HIGH (ref 15–41)
Albumin: 2.5 g/dL — ABNORMAL LOW (ref 3.5–5.0)
Alkaline Phosphatase: 389 U/L — ABNORMAL HIGH (ref 38–126)
Anion gap: 16 — ABNORMAL HIGH (ref 5–15)
BUN: 66 mg/dL — ABNORMAL HIGH (ref 8–23)
CO2: 16 mmol/L — ABNORMAL LOW (ref 22–32)
Calcium: 9.2 mg/dL (ref 8.9–10.3)
Chloride: 102 mmol/L (ref 98–111)
Creatinine, Ser: 1.65 mg/dL — ABNORMAL HIGH (ref 0.61–1.24)
GFR, Estimated: 45 mL/min — ABNORMAL LOW (ref >60)
Glucose, Bld: 163 mg/dL — ABNORMAL HIGH (ref 70–99)
Potassium: 6 mmol/L — ABNORMAL HIGH (ref 3.5–5.1)
Sodium: 134 mmol/L — ABNORMAL LOW (ref 135–145)
Total Bilirubin: 0.9 mg/dL (ref 0.3–1.2)
Total Protein: 7.5 g/dL (ref 6.5–8.1)

## 2020-04-18 LAB — URINALYSIS, ROUTINE W REFLEX MICROSCOPIC
Bilirubin Urine: NEGATIVE
Glucose, UA: NEGATIVE mg/dL
Hgb urine dipstick: NEGATIVE
Ketones, ur: NEGATIVE mg/dL
Nitrite: NEGATIVE
Protein, ur: NEGATIVE mg/dL
Specific Gravity, Urine: 1.021 (ref 1.005–1.030)
pH: 5 (ref 5.0–8.0)

## 2020-04-18 LAB — CBC WITH DIFFERENTIAL/PLATELET
Abs Immature Granulocytes: 0.2 10*3/uL — ABNORMAL HIGH (ref 0.00–0.07)
Basophils Absolute: 0 10*3/uL (ref 0.0–0.1)
Basophils Relative: 0 %
Eosinophils Absolute: 0.1 10*3/uL (ref 0.0–0.5)
Eosinophils Relative: 1 %
HCT: 33.4 % — ABNORMAL LOW (ref 39.0–52.0)
Hemoglobin: 9.6 g/dL — ABNORMAL LOW (ref 13.0–17.0)
Immature Granulocytes: 2 %
Lymphocytes Relative: 9 %
Lymphs Abs: 0.8 10*3/uL (ref 0.7–4.0)
MCH: 25.5 pg — ABNORMAL LOW (ref 26.0–34.0)
MCHC: 28.7 g/dL — ABNORMAL LOW (ref 30.0–36.0)
MCV: 88.6 fL (ref 80.0–100.0)
Monocytes Absolute: 0.6 10*3/uL (ref 0.1–1.0)
Monocytes Relative: 7 %
Neutro Abs: 7.2 10*3/uL (ref 1.7–7.7)
Neutrophils Relative %: 81 %
Platelets: 398 10*3/uL (ref 150–400)
RBC: 3.77 MIL/uL — ABNORMAL LOW (ref 4.22–5.81)
RDW: 18.4 % — ABNORMAL HIGH (ref 11.5–15.5)
WBC: 9 10*3/uL (ref 4.0–10.5)
nRBC: 0.2 % (ref 0.0–0.2)

## 2020-04-18 LAB — LACTIC ACID, PLASMA
Lactic Acid, Venous: 3 mmol/L (ref 0.5–1.9)
Lactic Acid, Venous: 3.2 mmol/L (ref 0.5–1.9)

## 2020-04-18 LAB — RESPIRATORY PANEL BY RT PCR (FLU A&B, COVID)
Influenza A by PCR: NEGATIVE
Influenza B by PCR: NEGATIVE
SARS Coronavirus 2 by RT PCR: NEGATIVE

## 2020-04-18 LAB — LIPASE, BLOOD: Lipase: 25 U/L (ref 11–51)

## 2020-04-18 LAB — AMMONIA: Ammonia: 31 umol/L (ref 9–35)

## 2020-04-18 MED ORDER — SODIUM ZIRCONIUM CYCLOSILICATE 10 G PO PACK: 10.0000 g | PACK | Freq: Once | ORAL | Status: DC

## 2020-04-18 MED ORDER — LORAZEPAM 2 MG/ML IJ SOLN
0.5000 mg | Freq: Once | INTRAMUSCULAR | Status: AC
  Administered 2020-04-19: 0.5 mg via INTRAVENOUS
  Filled 2020-04-18: qty 1

## 2020-04-18 MED ORDER — SODIUM CHLORIDE 0.9 % IV BOLUS
1000.0000 mL | Freq: Once | INTRAVENOUS | Status: AC
  Administered 2020-04-18: 1000 mL via INTRAVENOUS

## 2020-04-18 NOTE — ED Triage Notes (Addendum)
Pt arrived via [facility] from [place] for AMSx24 hrs. Per EMS they stated staff told them pt was "not acting normal" last night and that he had right sided facial droop at some point today, but could not say how long it lasted. l They told EMS pt ate lunch today, but wouldn't take meds. EMS gave NS 250cc.

## 2020-04-18 NOTE — ED Provider Notes (Signed)
[facility] EMERGENCY DEPARTMENT Provider Note   CSN: [id number] Arrival date & time: 04/18/20  1703     History Chief Complaint  Patient presents with  . Altered Mental Status    Scott [name] is a 69 y.o. male.  The history is provided by medical records. The history is limited by the condition of the patient. No language interpreter was used.  Altered Mental Status Presenting symptoms: confusion, lethargy and partial responsiveness   Severity:  Severe Most recent episode:  Yesterday Episode history:  Single Timing:  Constant Progression:  Unchanged Chronicity:  New Context: dementia and nursing home resident   Associated symptoms: no agitation and no fever    LVL 5 caveat for AMS     Past Medical History:  Diagnosis Date  . Aggression aggravated 08/20/2018  . Anxiety   . Chronic hyponatremia 05/2013, 11/2013  . Dementia ([place])   . Dyslipidemia   . GERD (gastroesophageal reflux disease)   . Hypertension   . Hypoglycemia   . Schizophrenia ([place])   . Seizures ([place])   . Syncope   . Tardive dyskinesia   . Urinary tract infection   . Vitamin D deficiency     Patient Active Problem List   Diagnosis Date Noted  . Generalized weakness 10/14/2017  . Diabetes ([place]) 02/07/2016  . Schizophrenia, undifferentiated ([place]) 01/24/2016  . GERD (gastroesophageal reflux disease) 01/24/2016  . Tardive dyskinesia 01/24/2016  . Chronic hyponatremia 01/24/2016  . Hypertension 01/24/2016    Past Surgical History:  Procedure Laterality Date  . CATARACT EXTRACTION W/PHACO Left 01/10/2016   Procedure: CATARACT EXTRACTION PHACO AND INTRAOCULAR LENS PLACEMENT (IOC);  Surgeon: Birder Robson, MD;  Location: [facility] ORS;  Service: Ophthalmology;  Laterality: Left;  [place] 00:53 AP% 24.2 CDE 12.88 fluid pack lot # [id number]  . EYE SURGERY    . FOOT SURGERY         No family history on file.  Social History   Tobacco Use  . Smoking status: Current Every Day  Smoker    Packs/day: 0.50    Types: Cigarettes  . Smokeless tobacco: Never Used  Vaping Use  . Vaping Use: Never used  Substance Use Topics  . Alcohol use: No  . Drug use: No    Home Medications Prior to Admission medications   Medication Sig Start Date End Date Taking? Authorizing Provider  atorvastatin (LIPITOR) 40 MG tablet Take 40 mg by mouth every evening.  02/08/20   [provider]  atorvastatin (LIPITOR) 40 MG tablet Take 1 tablet (40 mg total) by mouth daily for 7 days. 03/28/20 04/04/20  Harvest Dark, MD  benztropine (COGENTIN) 1 MG tablet Take 1 tablet (1 mg total) by mouth 2 (two) times daily. 02/22/20 03/23/20  Carrie Mew, MD  benztropine (COGENTIN) 1 MG tablet Take 1 tablet (1 mg total) by mouth 2 (two) times daily for 7 days. 03/28/20 04/04/20  Harvest Dark, MD  clonazePAM (KLONOPIN) 0.5 MG tablet Take 1 tablet (0.5 mg total) by mouth 2 (two) times daily for 7 days. 02/22/20 02/29/20  Carrie Mew, MD  clonazePAM (KLONOPIN) 0.5 MG tablet Take 1 tablet (0.5 mg total) by mouth 2 (two) times daily for 7 days. 03/28/20 04/04/20  Harvest Dark, MD  Deutetrabenazine (AUSTEDO) 6 MG TABS Take 6 mg by mouth in the morning and at bedtime.    [provider]  divalproex (DEPAKOTE ER) 250 MG 24 hr tablet Take 3 tablets (750 mg total) by mouth daily. 02/22/20 03/23/20  Carrie Mew, MD  divalproex (DEPAKOTE) 500 MG DR tablet Take 1 tablet (500 mg total) by mouth 2 (two) times daily. 03/28/20   Harvest Dark, MD  famotidine (PEPCID) 20 MG tablet Take 20 mg by mouth at bedtime.     [provider]  famotidine (PEPCID) 20 MG tablet Take 1 tablet (20 mg total) by mouth daily for 7 days. 03/28/20 04/04/20  Harvest Dark, MD  haloperidol (HALDOL) 2 MG tablet Take 1 tablet (2 mg total) by mouth daily. 03/28/20   Harvest Dark, MD  haloperidol (HALDOL) 5 MG tablet Take 1 tablet (5 mg total) by mouth at bedtime. 02/22/20 03/23/20   Carrie Mew, MD  haloperidol (HALDOL) 5 MG tablet Take 1 tablet (5 mg total) by mouth at bedtime. 03/28/20   Harvest Dark, MD  haloperidol decanoate (HALDOL DECANOATE) 100 MG/ML injection Inject 100 mg into the muscle every 30 (thirty) days. (25th of every month)    [provider]  insulin glargine (LANTUS) 100 UNIT/ML injection Inject 18 Units into the skin at bedtime.    [provider]  insulin glargine (LANTUS) 100 UNIT/ML injection 18 units QHS 03/28/20 03/28/21  Harvest Dark, MD  Iron Combinations (NIFEREX) TABS Take 1 tablet by mouth daily.    [provider]  LORazepam (ATIVAN) 0.5 MG tablet Take 0.5 mg by mouth in the morning, at noon, and at bedtime.    [provider]  metFORMIN (GLUCOPHAGE) 1000 MG tablet Take 1,000 mg by mouth 2 (two) times daily. 06/10/18   [provider]  metFORMIN (GLUCOPHAGE) 1000 MG tablet Take 1 tablet (1,000 mg total) by mouth 2 (two) times daily with a meal for 7 days. 03/28/20 04/04/20  Harvest Dark, MD  NOVOLOG 100 UNIT/ML injection Inject 0-10 Units into the skin 4 (four) times daily -  before meals and at bedtime.     [provider]  OLANZapine (ZYPREXA) 15 MG tablet Take 1 tablet (15 mg total) by mouth in the morning and at bedtime. 03/28/20   Harvest Dark, MD  senna (SENOKOT) 8.6 MG TABS tablet Take 1 tablet by mouth daily as needed for mild constipation.    [provider]  tamsulosin (FLOMAX) 0.4 MG CAPS capsule Take 0.4 mg by mouth every evening.     [provider]  tamsulosin (FLOMAX) 0.4 MG CAPS capsule Take 1 capsule (0.4 mg total) by mouth daily. 03/28/20   Harvest Dark, MD  traZODone (DESYREL) 150 MG tablet Take 1 tablet (150 mg total) by mouth at bedtime. 02/22/20 03/23/20  Carrie Mew, MD  traZODone (DESYREL) 150 MG tablet Take 1 tablet (150 mg total) by mouth at bedtime. 03/28/20   Harvest Dark, MD  Vitamin D, Ergocalciferol,  (DRISDOL) 50000 units CAPS capsule Take 50,000 Units by mouth every 7 (seven) days.    [provider]    Allergies    Patient has no known allergies.  Review of Systems   Review of Systems  Unable to perform ROS: Patient unresponsive  Constitutional: Negative for fever.  Psychiatric/Behavioral: Positive for confusion. Negative for agitation.    Physical Exam Updated Vital Signs BP 110/71   Pulse (!) 104   Temp (!) 97.5 F (36.4 C) (Oral)   Resp (!) 24   Ht 6\' 4"  (1.93 m)   Wt 90 kg   SpO2 100%   BMI 24.15 kg/m   Physical Exam Vitals and nursing note reviewed.  Constitutional:      General: He is not in acute  distress.    Appearance: He is well-developed. He is ill-appearing. He is not toxic-appearing or diaphoretic.  HENT:     Head: Normocephalic and atraumatic.     Mouth/Throat:     Mouth: Mucous membranes are dry.     Pharynx: No oropharyngeal exudate or posterior oropharyngeal erythema.  Eyes:     Conjunctiva/sclera: Conjunctivae normal.     Pupils: Pupils are equal, round, and reactive to light.  Cardiovascular:     Rate and Rhythm: Regular rhythm. Tachycardia present.     Pulses: Normal pulses.     Heart sounds: No murmur heard.   Pulmonary:     Effort: Pulmonary effort is normal. No respiratory distress.     Breath sounds: Rhonchi (faint) present. No wheezing or rales.  Chest:     Chest wall: No tenderness.  Abdominal:     General: Abdomen is flat.     Palpations: Abdomen is soft.     Tenderness: There is no abdominal tenderness. There is no right CVA tenderness, left CVA tenderness, guarding or rebound.  Musculoskeletal:        General: No tenderness or signs of injury.     Cervical back: Neck supple. No tenderness.  Skin:    General: Skin is warm and dry.     Capillary Refill: Capillary refill takes less than 2 seconds.     Findings: No erythema.  Neurological:     Mental Status: He is alert. He is confused.     GCS: GCS eye subscore is  2. GCS verbal subscore is 2. GCS motor subscore is 6.     ED Results / Procedures / Treatments   Labs (all labs ordered are listed, but only abnormal results are displayed) Labs Reviewed  CBC WITH DIFFERENTIAL/PLATELET - Abnormal; Notable for the following components:      Result Value   RBC 3.77 (*)    Hemoglobin 9.6 (*)    HCT 33.4 (*)    MCH 25.5 (*)    MCHC 28.7 (*)    RDW 18.4 (*)    Abs Immature Granulocytes 0.20 (*)    All other components within normal limits  COMPREHENSIVE METABOLIC PANEL - Abnormal; Notable for the following components:   Sodium 134 (*)    Potassium 6.0 (*)    CO2 16 (*)    Glucose, Bld 163 (*)    BUN 66 (*)    Creatinine, Ser 1.65 (*)    Albumin 2.5 (*)    AST 46 (*)    Alkaline Phosphatase 389 (*)    GFR, Estimated 45 (*)    Anion gap 16 (*)    All other components within normal limits  LACTIC ACID, PLASMA - Abnormal; Notable for the following components:   Lactic Acid, Venous 3.0 (*)    All other components within normal limits  LACTIC ACID, PLASMA - Abnormal; Notable for the following components:   Lactic Acid, Venous 3.2 (*)    All other components within normal limits  URINALYSIS, ROUTINE W REFLEX MICROSCOPIC - Abnormal; Notable for the following components:   Color, Urine AMBER (*)    APPearance HAZY (*)    Leukocytes,Ua SMALL (*)    Bacteria, UA RARE (*)    All other components within normal limits  RESPIRATORY PANEL BY RT PCR (FLU A&B, COVID)  CULTURE, BLOOD (ROUTINE X 2)  CULTURE, BLOOD (ROUTINE X 2)  URINE CULTURE  LIPASE, BLOOD  AMMONIA  TSH    EKG EKG Interpretation  Date/Time:  Monday April 18 2020 17:10:18 EST Ventricular Rate:  100 PR Interval:    QRS Duration: 91 QT Interval:  333 QTC Calculation: 430 R Axis:   43 Text Interpretation: Sinus tachycardia Nonspecific T abnormalities, lateral leads When compared to prior, similar apperance. No STEMI Confirmed by Antony Blackbird [id number]) on 04/18/2020 5:11:22  PM   Radiology CT Head Wo Contrast  Result Date: 04/18/2020 CLINICAL DATA:  69 year old male with altered mental status. EXAM: CT HEAD WITHOUT CONTRAST TECHNIQUE: Contiguous axial images were obtained from the base of the skull through the vertex without intravenous contrast. COMPARISON:  Head CT dated 10/14/2017. FINDINGS: Evaluation of this exam is limited due to motion artifact. Brain: Mild age-related atrophy and chronic microvascular ischemic changes. There is no acute intracranial hemorrhage. No mass effect midline shift no extra-axial fluid collection. There is an apparent empty sella. Vascular: No hyperdense vessel or unexpected calcification. Skull: Normal. Negative for fracture or focal lesion. Sinuses/Orbits: No acute finding. Other: None IMPRESSION: 1. No acute intracranial pathology. 2. Mild age-related atrophy and chronic microvascular ischemic changes. Electronically Signed   By: Anner Crete M.D.   On: 04/18/2020 18:11   DG Pelvis Portable  Result Date: 04/18/2020 CLINICAL DATA:  MRI screening examination EXAM: PORTABLE PELVIS 1-2 VIEWS COMPARISON:  None. FINDINGS: There is no evidence of pelvic fracture or diastasis. No pelvic bone lesions are seen. No intrapelvic metallic foreign body identified. IMPRESSION: No intrapelvic metallic foreign body. Electronically Signed   By: Fidela Salisbury MD   On: 04/18/2020 22:00   DG Chest Portable 1 View  Result Date: 04/18/2020 CLINICAL DATA:  69 year old male with altered mental status. EXAM: PORTABLE CHEST 1 VIEW COMPARISON:  Chest radiograph dated 03/15/2020 FINDINGS: Left lung base linear and platelike atelectasis. No focal consolidation, pleural effusion, pneumothorax. The cardiac silhouette is within limits. No acute osseous pathology. IMPRESSION: No active disease. Electronically Signed   By: Anner Crete M.D.   On: 04/18/2020 18:08   DG Abd Portable 1 View  Result Date: 04/18/2020 CLINICAL DATA:  MRI screening examination  EXAM: PORTABLE ABDOMEN - 1 VIEW COMPARISON:  None. FINDINGS: The bowel gas pattern is normal. No radio-opaque calculi or other significant radiographic abnormality are seen. No intra-abdominal metallic foreign body identified. IMPRESSION: No intra-abdominal metallic foreign body. Electronically Signed   By: Fidela Salisbury MD   On: 04/18/2020 22:00    Procedures Procedures (including critical care time)  Medications Ordered in ED Medications  sodium chloride 0.9 % bolus 1,000 mL (has no administration in time range)  sodium zirconium cyclosilicate (LOKELMA) packet 10 g (has no administration in time range)  LORazepam (ATIVAN) injection 0.5 mg (has no administration in time range)  sodium chloride 0.9 % bolus 1,000 mL (0 mLs Intravenous Stopped 04/18/20 2235)    ED Course  I have reviewed the triage vital signs and the nursing notes.  Pertinent labs & imaging results that were available during my care of the patient were reviewed by me and considered in my medical decision making (see chart for details).    MDM Rules/Calculators/A&P                          Scott [name] is a 69 y.o. male with a past medical history significant for hypertension, hyponatremia, diabetes, schizophrenia, GERD, dementia, seizures, anxiety who presents from a skilled [facility] for around 24 hours of worsening altered mental status.  According to EMS report, patient started not  acting normal yesterday.  Apparently patient had some intermittent right-sided facial droop and has not been acting like himself today.  He did try to eat some lunch but would not answer questions when asked.  He also refused medications which is reportedly normal for him.  He did not have any reported fevers, chills, cough, urinary symptoms or trauma.  He otherwise is able to speak to the facility per EMS but is not acting normal.  On arrival, patient is making incomprehensible speech noises but was able to follow  some commands with painful stimulation and loud yelling.  Patient is very somnolent.  GCS is around 10 with incomprehensible speech and eyes opening only to pain but he is following commands.  Abdomen was nontender.  Breath sounds are equal bilaterally.  Chest was nontender.  There was some possible rhonchi in the bases.  No evidence of acute trauma.  Patient does not have clear facial droop but is not smiling on command.  His pupils are symmetric and reactive.  Difficult to assess extraocular movements with the altered mental status.  Could not assess coordination initially.  We will get work-up to look for cause of altered mental status including infections, intracranial abnormalities, or significant electrolyte imbalances.  As his symptoms began yesterday, do not feel he is a code stroke at this time.  He is afebrile on arrival.  Vital signs are reassuring on arrival.  He is on room air.  Anticipate reassessment after work-up to determine disposition.  Patient's lactic acid was elevated, will give some more fluids and it will be trended.  Second lactic acid is still slightly uptrending.  We will give more fluids.  Covid and flu test negative.  Urinalysis shows some leukocytes and rare bacteria, no nitrites are present, still unclear if it is a UTI.  Culture was sent.  Labs began to returned showing he does have acute kidney injury and elevated potassium.  We will give medications for hyperkalemia.  With his dehydration, do not feel he is appropriate for Lasix at this time however, he will still get a fluids.  We will also give Lokelma per the order set.   After fluids, patient started to wake up.  He is following commands more and is opening his eyes to voice.  Is still unable to answer questions appropriately and given the reported right-sided facial droop intermittently, we still feel he needs MRI.  X-rays did not show any evidence of metallic bodies in his abdomen pelvis and he will get the  MRI.  After MRI, he will need admission for dehydration with significant electrolyte abnormalities including hyperkalemia.  Care transferred to oncoming team while awaiting MRI and admission for dehydration, somnolence, altered mental status, and hyperkalemia. If patient develops fever or he wakes up enough to describe any urinary symptoms concerning for infection, would likely treat for possible UTI.    Final Clinical Impression(s) / ED Diagnoses Final diagnoses:  Dehydration  Confusion  Somnolence  Hyperkalemia     Clinical Impression: 1. Dehydration   2. Confusion   3. Somnolence   4. Hyperkalemia     Disposition: Admit  This note was prepared with assistance of Dragon voice recognition software. Occasional wrong-word or sound-a-like substitutions may have occurred due to the inherent limitations of voice recognition software.     Geraldyne Barraclough, Gwenyth Allegra, MD 04/18/20 [phone]

## 2020-04-18 NOTE — ED Notes (Signed)
Patient transported to CT 

## 2020-04-19 ENCOUNTER — Observation Stay (HOSPITAL_COMMUNITY): Payer: Medicare Other

## 2020-04-19 ENCOUNTER — Emergency Department (HOSPITAL_COMMUNITY): Payer: Medicare Other

## 2020-04-19 DIAGNOSIS — F17210 Nicotine dependence, cigarettes, uncomplicated: Secondary | ICD-10-CM | POA: Diagnosis present

## 2020-04-19 DIAGNOSIS — F203 Undifferentiated schizophrenia: Secondary | ICD-10-CM | POA: Diagnosis present

## 2020-04-19 DIAGNOSIS — E119 Type 2 diabetes mellitus without complications: Secondary | ICD-10-CM

## 2020-04-19 DIAGNOSIS — K219 Gastro-esophageal reflux disease without esophagitis: Secondary | ICD-10-CM | POA: Diagnosis present

## 2020-04-19 DIAGNOSIS — G2401 Drug induced subacute dyskinesia: Secondary | ICD-10-CM | POA: Diagnosis present

## 2020-04-19 DIAGNOSIS — E875 Hyperkalemia: Secondary | ICD-10-CM | POA: Diagnosis not present

## 2020-04-19 DIAGNOSIS — Z85038 Personal history of other malignant neoplasm of large intestine: Secondary | ICD-10-CM | POA: Diagnosis not present

## 2020-04-19 DIAGNOSIS — F0390 Unspecified dementia without behavioral disturbance: Secondary | ICD-10-CM | POA: Diagnosis present

## 2020-04-19 DIAGNOSIS — K567 Ileus, unspecified: Secondary | ICD-10-CM | POA: Diagnosis present

## 2020-04-19 DIAGNOSIS — N179 Acute kidney failure, unspecified: Secondary | ICD-10-CM | POA: Diagnosis present

## 2020-04-19 DIAGNOSIS — Z20822 Contact with and (suspected) exposure to covid-19: Secondary | ICD-10-CM | POA: Diagnosis present

## 2020-04-19 DIAGNOSIS — E860 Dehydration: Secondary | ICD-10-CM | POA: Diagnosis present

## 2020-04-19 DIAGNOSIS — C787 Secondary malignant neoplasm of liver and intrahepatic bile duct: Secondary | ICD-10-CM | POA: Diagnosis present

## 2020-04-19 DIAGNOSIS — E559 Vitamin D deficiency, unspecified: Secondary | ICD-10-CM | POA: Diagnosis present

## 2020-04-19 DIAGNOSIS — I700 Atherosclerosis of aorta: Secondary | ICD-10-CM | POA: Diagnosis present

## 2020-04-19 DIAGNOSIS — G9341 Metabolic encephalopathy: Secondary | ICD-10-CM | POA: Diagnosis present

## 2020-04-19 DIAGNOSIS — E872 Acidosis: Secondary | ICD-10-CM | POA: Diagnosis present

## 2020-04-19 DIAGNOSIS — Z794 Long term (current) use of insulin: Secondary | ICD-10-CM

## 2020-04-19 DIAGNOSIS — C7951 Secondary malignant neoplasm of bone: Secondary | ICD-10-CM | POA: Diagnosis present

## 2020-04-19 DIAGNOSIS — I10 Essential (primary) hypertension: Secondary | ICD-10-CM | POA: Diagnosis present

## 2020-04-19 DIAGNOSIS — Z9049 Acquired absence of other specified parts of digestive tract: Secondary | ICD-10-CM | POA: Diagnosis not present

## 2020-04-19 DIAGNOSIS — T50905A Adverse effect of unspecified drugs, medicaments and biological substances, initial encounter: Secondary | ICD-10-CM | POA: Diagnosis present

## 2020-04-19 DIAGNOSIS — E861 Hypovolemia: Secondary | ICD-10-CM | POA: Diagnosis present

## 2020-04-19 DIAGNOSIS — N400 Enlarged prostate without lower urinary tract symptoms: Secondary | ICD-10-CM | POA: Diagnosis present

## 2020-04-19 LAB — SODIUM, URINE, RANDOM: Sodium, Ur: 13 mmol/L

## 2020-04-19 LAB — BASIC METABOLIC PANEL
Anion gap: 13 (ref 5–15)
Anion gap: 11 (ref 5–15)
BUN: 68 mg/dL — ABNORMAL HIGH (ref 8–23)
BUN: 61 mg/dL — ABNORMAL HIGH (ref 8–23)
CO2: 18 mmol/L — ABNORMAL LOW (ref 22–32)
CO2: 18 mmol/L — ABNORMAL LOW (ref 22–32)
Calcium: 9.3 mg/dL (ref 8.9–10.3)
Calcium: 9.2 mg/dL (ref 8.9–10.3)
Chloride: 105 mmol/L (ref 98–111)
Chloride: 108 mmol/L (ref 98–111)
Creatinine, Ser: 1.83 mg/dL — ABNORMAL HIGH (ref 0.61–1.24)
Creatinine, Ser: 1.24 mg/dL (ref 0.61–1.24)
GFR, Estimated: 40 mL/min — ABNORMAL LOW (ref >60)
GFR, Estimated: >60 mL/min (ref >60)
Glucose, Bld: 127 mg/dL — ABNORMAL HIGH (ref 70–99)
Glucose, Bld: 102 mg/dL — ABNORMAL HIGH (ref 70–99)
Potassium: 6.1 mmol/L — ABNORMAL HIGH (ref 3.5–5.1)
Potassium: 5.2 mmol/L — ABNORMAL HIGH (ref 3.5–5.1)
Sodium: 136 mmol/L (ref 135–145)
Sodium: 137 mmol/L (ref 135–145)

## 2020-04-19 LAB — TSH: TSH: 4.803 u[IU]/mL — ABNORMAL HIGH (ref 0.350–4.500)

## 2020-04-19 LAB — CBG MONITORING, ED
Glucose-Capillary: 106 mg/dL — ABNORMAL HIGH (ref 70–99)
Glucose-Capillary: 96 mg/dL (ref 70–99)
Glucose-Capillary: 91 mg/dL (ref 70–99)

## 2020-04-19 LAB — POTASSIUM
Potassium: 5.3 mmol/L — ABNORMAL HIGH (ref 3.5–5.1)
Potassium: 6.8 mmol/L (ref 3.5–5.1)

## 2020-04-19 LAB — VITAMIN B12: Vitamin B-12: 6999 pg/mL — ABNORMAL HIGH (ref 180–914)

## 2020-04-19 LAB — RPR: RPR Ser Ql: NONREACTIVE

## 2020-04-19 LAB — LACTIC ACID, PLASMA
Lactic Acid, Venous: 2.3 mmol/L (ref 0.5–1.9)
Lactic Acid, Venous: 1.5 mmol/L (ref 0.5–1.9)

## 2020-04-19 LAB — CBC
HCT: 32.2 % — ABNORMAL LOW (ref 39.0–52.0)
Hemoglobin: 9.3 g/dL — ABNORMAL LOW (ref 13.0–17.0)
MCH: 25.3 pg — ABNORMAL LOW (ref 26.0–34.0)
MCHC: 28.9 g/dL — ABNORMAL LOW (ref 30.0–36.0)
MCV: 87.7 fL (ref 80.0–100.0)
Platelets: 409 10*3/uL — ABNORMAL HIGH (ref 150–400)
RBC: 3.67 MIL/uL — ABNORMAL LOW (ref 4.22–5.81)
RDW: 18.4 % — ABNORMAL HIGH (ref 11.5–15.5)
WBC: 8.3 10*3/uL (ref 4.0–10.5)
nRBC: 0.2 % (ref 0.0–0.2)

## 2020-04-19 LAB — URINE CULTURE

## 2020-04-19 LAB — BRAIN NATRIURETIC PEPTIDE: B Natriuretic Peptide: 37.8 pg/mL (ref 0.0–100.0)

## 2020-04-19 LAB — GLUCOSE, CAPILLARY: Glucose-Capillary: 82 mg/dL (ref 70–99)

## 2020-04-19 LAB — FOLATE: Folate: 17.4 ng/mL (ref >5.9)

## 2020-04-19 LAB — CREATININE, URINE, RANDOM: Creatinine, Urine: 220.49 mg/dL

## 2020-04-19 MED ORDER — HALOPERIDOL LACTATE 5 MG/ML IJ SOLN: 2.0000 mg | Freq: Four times a day (QID) | INTRAMUSCULAR | Status: DC | PRN

## 2020-04-19 MED ORDER — LACTATED RINGERS IV BOLUS
1000.0000 mL | Freq: Once | INTRAVENOUS | Status: AC
  Administered 2020-04-19: 1000 mL via INTRAVENOUS

## 2020-04-19 MED ORDER — SENNA 8.6 MG PO TABS
1.0000 | ORAL_TABLET | Freq: Every day | ORAL | Status: DC
  Administered 2020-04-20: 8.6 mg via ORAL
  Filled 2020-04-19 (×3): qty 1

## 2020-04-19 MED ORDER — SENNOSIDES-DOCUSATE SODIUM 8.6-50 MG PO TABS: 1.0000 | ORAL_TABLET | Freq: Every evening | ORAL | Status: DC | PRN

## 2020-04-19 MED ORDER — ONDANSETRON HCL 4 MG PO TABS: 4.0000 mg | ORAL_TABLET | Freq: Four times a day (QID) | ORAL | Status: DC | PRN

## 2020-04-19 MED ORDER — CALCIUM GLUCONATE-NACL 2-0.675 GM/100ML-% IV SOLN
2.0000 g | Freq: Once | INTRAVENOUS | Status: AC
  Administered 2020-04-19: 2000 mg via INTRAVENOUS
  Filled 2020-04-19: qty 100

## 2020-04-19 MED ORDER — SODIUM CHLORIDE 0.9 % IV SOLN: INTRAVENOUS | Status: DC

## 2020-04-19 MED ORDER — LACTATED RINGERS IV SOLN: INTRAVENOUS | Status: AC

## 2020-04-19 MED ORDER — ONDANSETRON HCL 4 MG/2ML IJ SOLN: 4.0000 mg | Freq: Four times a day (QID) | INTRAMUSCULAR | Status: DC | PRN

## 2020-04-19 MED ORDER — INSULIN ASPART 100 UNIT/ML ~~LOC~~ SOLN
0.0000 [IU] | Freq: Three times a day (TID) | SUBCUTANEOUS | Status: DC
  Administered 2020-04-20 (×2): 1 [IU] via SUBCUTANEOUS

## 2020-04-19 MED ORDER — ENOXAPARIN SODIUM 40 MG/0.4ML ~~LOC~~ SOLN
40.0000 mg | SUBCUTANEOUS | Status: DC
  Administered 2020-04-19 – 2020-04-22 (×3): 40 mg via SUBCUTANEOUS
  Filled 2020-04-19 (×4): qty 0.4

## 2020-04-19 MED ORDER — IOHEXOL 300 MG/ML  SOLN
100.0000 mL | Freq: Once | INTRAMUSCULAR | Status: AC | PRN
  Administered 2020-04-19: 80 mL via INTRAVENOUS

## 2020-04-19 MED ORDER — DEXTROSE 50 % IV SOLN
1.0000 | Freq: Once | INTRAVENOUS | Status: AC
  Administered 2020-04-19: 50 mL via INTRAVENOUS
  Filled 2020-04-19: qty 50

## 2020-04-19 MED ORDER — ACETAMINOPHEN 325 MG PO TABS: 650.0000 mg | ORAL_TABLET | Freq: Four times a day (QID) | ORAL | Status: DC | PRN

## 2020-04-19 MED ORDER — SODIUM BICARBONATE 8.4 % IV SOLN
Freq: Once | INTRAVENOUS | Status: AC
  Filled 2020-04-19 (×2): qty 250

## 2020-04-19 MED ORDER — FERROUS SULFATE 325 (65 FE) MG PO TABS: 325.0000 mg | ORAL_TABLET | Freq: Two times a day (BID) | ORAL | 0 refills | Status: AC

## 2020-04-19 MED ORDER — ACETAMINOPHEN 650 MG RE SUPP: 650.0000 mg | Freq: Four times a day (QID) | RECTAL | Status: DC | PRN

## 2020-04-19 MED ORDER — SODIUM BICARBONATE 8.4 % IV SOLN
50.0000 meq | Freq: Once | INTRAVENOUS | Status: AC
  Administered 2020-04-19: 50 meq via INTRAVENOUS
  Filled 2020-04-19: qty 50

## 2020-04-19 MED ORDER — INSULIN ASPART 100 UNIT/ML IV SOLN
10.0000 [IU] | Freq: Once | INTRAVENOUS | Status: AC
  Administered 2020-04-19: 10 [IU] via INTRAVENOUS

## 2020-04-19 MED ORDER — POLYETHYLENE GLYCOL 3350 17 G PO PACK
17.0000 g | PACK | Freq: Every day | ORAL | Status: DC
  Administered 2020-04-20: 17 g via ORAL
  Filled 2020-04-19 (×3): qty 1

## 2020-04-19 MED ORDER — SODIUM BICARBONATE 8.4 % IV SOLN
INTRAVENOUS | Status: DC
  Filled 2020-04-19 (×30): qty 250

## 2020-04-19 MED ORDER — INSULIN ASPART 100 UNIT/ML ~~LOC~~ SOLN
0.0000 [IU] | Freq: Every day | SUBCUTANEOUS | Status: DC
  Administered 2020-04-21 – 2020-04-22 (×2): 2 [IU] via SUBCUTANEOUS

## 2020-04-19 NOTE — H&P (Signed)
History and Physical    Scott [name] WUX:[id number] DOB: 13-Dec-1950 DOA: 04/18/2020  PCP: Lauretta Grill, NP   Patient coming from: SNF   Chief Complaint: AMS   HPI: Scott [name] is a 69 y.o. male with medical history significant for schizophrenia, insulin-dependent diabetes mellitus, hypertension, tardive dyskinesia, and chronic hyponatremia, now presenting to emergency department from his SNF for evaluation of altered mental status.  Patient reportedly became altered approximately 24 hours ago, did not take his medications, tried to eat some lunch yesterday, but was not answering any questions.  There was question of possible right facial droop at his facility.  At baseline, he reportedly talks but is often difficult to understand.  No recent fall or trauma reported.  ED Course: Upon arrival to the ED, patient is found to be afebrile, saturating well on room air, and with stable blood pressure.  EKG features sinus rhythm with rate of 100 and peaked T waves.  Chest x-ray is negative for acute cardiopulmonary disease.  Noncontrast head CT negative for acute intracranial abnormality.  No acute ischemia noted on brain MRI.  Chemistry panel features a BUN of 66, bicarbonate 16, anion gap 16, potassium 6.0, and creatinine 1.65, up from 0.9 last month.  CBC with normocytic anemia.  Lactic acid elevated to 3.0 and then 3.2.  Ammonia level is normal and COVID-19 negative.  Blood and urine cultures were collected in the emergency department and the patient was treated with 2 L of saline.  Review of Systems:  Unable to complete ROS secondary the patient's clinical condition.  Past Medical History:  Diagnosis Date  . Aggression aggravated 08/20/2018  . Anxiety   . Chronic hyponatremia 05/2013, 11/2013  . Dementia ([place])   . Dyslipidemia   . GERD (gastroesophageal reflux disease)   . Hypertension   . Hypoglycemia   . Schizophrenia ([place])   . Seizures ([place])   . Syncope   . Tardive dyskinesia    . Urinary tract infection   . Vitamin D deficiency     Past Surgical History:  Procedure Laterality Date  . CATARACT EXTRACTION W/PHACO Left 01/10/2016   Procedure: CATARACT EXTRACTION PHACO AND INTRAOCULAR LENS PLACEMENT (IOC);  Surgeon: Birder Robson, MD;  Location: [facility] ORS;  Service: Ophthalmology;  Laterality: Left;  [place] 00:53 AP% 24.2 CDE 12.88 fluid pack lot # [id number]  . EYE SURGERY    . FOOT SURGERY      Social History:   reports that he has been smoking cigarettes. He has been smoking about 0.50 packs per day. He has never used smokeless tobacco. He reports that he does not drink alcohol and does not use drugs.  No Known Allergies  No family history on file.   Prior to Admission medications   Medication Sig Start Date End Date Taking? Authorizing Provider  atorvastatin (LIPITOR) 40 MG tablet Take 1 tablet (40 mg total) by mouth daily for 7 days. Patient taking differently: Take 40 mg by mouth at bedtime.  03/28/20  Yes Harvest Dark, MD  benztropine (COGENTIN) 1 MG tablet Take 1 tablet (1 mg total) by mouth 2 (two) times daily for 7 days. 03/28/20  Yes Paduchowski, Lennette Bihari, MD  clonazePAM (KLONOPIN) 0.5 MG tablet Take 1 tablet (0.5 mg total) by mouth 2 (two) times daily for 7 days. 03/28/20  Yes Harvest Dark, MD  divalproex (DEPAKOTE ER) 250 MG 24 hr tablet Take 3 tablets (750 mg total) by mouth daily. 02/22/20  Yes Carrie Mew, MD  famotidine (PEPCID)  20 MG tablet Take 1 tablet (20 mg total) by mouth daily for 7 days. Patient taking differently: Take 20 mg by mouth at bedtime.  03/28/20  Yes Harvest Dark, MD  haloperidol (HALDOL) 2 MG tablet Take 1 tablet (2 mg total) by mouth daily. 03/28/20  Yes Harvest Dark, MD  haloperidol (HALDOL) 5 MG tablet Take 1 tablet (5 mg total) by mouth at bedtime. 03/28/20  Yes Harvest Dark, MD  insulin glargine (LANTUS) 100 UNIT/ML injection Inject 18 Units into the skin at bedtime.   Yes [provider]  insulin glargine (LANTUS) 100 UNIT/ML injection 18 units QHS Patient taking differently: Inject 18 Units into the skin at bedtime.  03/28/20 03/28/21 Yes Harvest Dark, MD  metFORMIN (GLUCOPHAGE) 1000 MG tablet Take 1 tablet (1,000 mg total) by mouth 2 (two) times daily with a meal for 7 days. 03/28/20  Yes Harvest Dark, MD  OLANZapine (ZYPREXA) 15 MG tablet Take 1 tablet (15 mg total) by mouth in the morning and at bedtime. 03/28/20  Yes Harvest Dark, MD  tamsulosin (FLOMAX) 0.4 MG CAPS capsule Take 1 capsule (0.4 mg total) by mouth daily. Patient taking differently: Take 0.4 mg by mouth at bedtime.  03/28/20  Yes Harvest Dark, MD  traZODone (DESYREL) 150 MG tablet Take 1 tablet (150 mg total) by mouth at bedtime. 03/28/20  Yes Harvest Dark, MD  Deutetrabenazine (AUSTEDO) 6 MG TABS Take 6 mg by mouth in the morning and at bedtime.    [provider]  divalproex (DEPAKOTE) 500 MG DR tablet Take 1 tablet (500 mg total) by mouth 2 (two) times daily. 03/28/20   Harvest Dark, MD  haloperidol decanoate (HALDOL DECANOATE) 100 MG/ML injection Inject 100 mg into the muscle every 30 (thirty) days. (25th of every month)    [provider]  Iron Combinations (NIFEREX) TABS Take 1 tablet by mouth daily.    [provider]  LORazepam (ATIVAN) 0.5 MG tablet Take 0.5 mg by mouth in the morning, at noon, and at bedtime.    [provider]  NOVOLOG 100 UNIT/ML injection Inject 0-10 Units into the skin 4 (four) times daily -  before meals and at bedtime.     [provider]  senna (SENOKOT) 8.6 MG TABS tablet Take 1 tablet by mouth daily as needed for mild constipation.    [provider]  Vitamin D, Ergocalciferol, (DRISDOL) 50000 units CAPS capsule Take 50,000 Units by mouth every 7 (seven) days.    [provider]    Physical Exam: Vitals:   04/18/20 2238 04/18/20 2345 04/18/20 2356 04/19/20 0120   BP: 118/75 115/77  121/72  Pulse: 93 92  90  Resp: 16 11  13   Temp:   98 F (36.7 C)   TempSrc:   Rectal   SpO2: 100% 100%  100%  Weight:      Height:        Constitutional: Sleeping, no diaphoresis, no pallor  Eyes: PERTLA, lids and conjunctivae normal ENMT: Mucous membranes are moist. Posterior pharynx clear of any exudate or lesions.   Neck: normal, supple, no masses, no thyromegaly Respiratory: no wheezing, no crackles. No accessory muscle use.  Cardiovascular: S1 & S2 heard, regular rate and rhythm. No extremity edema. No significant JVD. Abdomen: No distension, no tenderness, soft. Bowel sounds active.  Musculoskeletal: no clubbing / cyanosis. No joint deformity upper and lower extremities.   Skin: no significant rashes, lesions, ulcers. Poor turgor, xerosis. Neurologic: No gross facial asymmetry,  PERRL. Sensation intact. Moves all extremities.  Psychiatric: Sleeping, wakes to painful stimuli but does not answer questions or follow commands.   Labs and Imaging on Admission: I have personally reviewed following labs and imaging studies  CBC: Recent Labs  Lab 04/18/20 1718  WBC 9.0  NEUTROABS 7.2  HGB 9.6*  HCT 33.4*  MCV 88.6  PLT 741   Basic Metabolic Panel: Recent Labs  Lab 04/18/20 1718  NA 134*  K 6.0*  CL 102  CO2 16*  GLUCOSE 163*  BUN 66*  CREATININE 1.65*  CALCIUM 9.2   GFR: Estimated Creatinine Clearance: 52.6 mL/min (A) (by C-G formula based on SCr of 1.65 mg/dL (H)). Liver Function Tests: Recent Labs  Lab 04/18/20 1718  AST 46*  ALT 26  ALKPHOS 389*  BILITOT 0.9  PROT 7.5  ALBUMIN 2.5*   Recent Labs  Lab 04/18/20 1718  LIPASE 25   Recent Labs  Lab 04/18/20 1724  AMMONIA 31   Coagulation Profile: No results for input(s): INR, PROTIME in the last 168 hours. Cardiac Enzymes: No results for input(s): CKTOTAL, CKMB, CKMBINDEX, TROPONINI in the last 168 hours. BNP (last 3 results) No results for input(s): PROBNP in the last 8760  hours. HbA1C: No results for input(s): HGBA1C in the last 72 hours. CBG: No results for input(s): GLUCAP in the last 168 hours. Lipid Profile: No results for input(s): CHOL, HDL, LDLCALC, TRIG, CHOLHDL, LDLDIRECT in the last 72 hours. Thyroid Function Tests: No results for input(s): TSH, T4TOTAL, FREET4, T3FREE, THYROIDAB in the last 72 hours. Anemia Panel: No results for input(s): VITAMINB12, FOLATE, FERRITIN, TIBC, IRON, RETICCTPCT in the last 72 hours. Urine analysis:    Component Value Date/Time   COLORURINE AMBER (A) 04/18/2020 1820   APPEARANCEUR HAZY (A) 04/18/2020 1820   APPEARANCEUR Cloudy 07/07/2014 1349   LABSPEC 1.021 04/18/2020 1820   LABSPEC 1.016 07/07/2014 1349   PHURINE 5.0 04/18/2020 1820   GLUCOSEU NEGATIVE 04/18/2020 1820   GLUCOSEU 50 mg/dL 07/07/2014 1349   HGBUR NEGATIVE 04/18/2020 1820   BILIRUBINUR NEGATIVE 04/18/2020 1820   BILIRUBINUR Negative 07/07/2014 [place] 04/18/2020 1820   PROTEINUR NEGATIVE 04/18/2020 1820   UROBILINOGEN 1.0 03/29/2015 1957   NITRITE NEGATIVE 04/18/2020 1820   LEUKOCYTESUR SMALL (A) 04/18/2020 1820   LEUKOCYTESUR 3+ 07/07/2014 1349   Sepsis Labs: @LABRCNTIP (procalcitonin:4,lacticidven:4) ) Recent Results (from the past 240 hour(s))  Respiratory Panel by RT PCR (Flu A&B, Covid) - Urine, Random     Status: None   Collection Time: 04/18/20  6:15 PM   Specimen: Urine, Random; Nasopharyngeal  Result Value Ref Range Status   SARS Coronavirus 2 by RT PCR NEGATIVE NEGATIVE Final    Comment: (NOTE) SARS-CoV-2 target nucleic acids are NOT DETECTED.  The SARS-CoV-2 RNA is generally detectable in upper respiratoy specimens during the acute phase of infection. The lowest concentration of SARS-CoV-2 viral copies this assay can detect is 131 copies/mL. A negative result does not preclude SARS-Cov-2 infection and should not be used as the sole basis for treatment or other patient management decisions. A negative  result may occur with  improper specimen collection/handling, submission of specimen other than nasopharyngeal swab, presence of viral mutation(s) within the areas targeted by this assay, and inadequate number of viral copies (<131 copies/mL). A negative result must be combined with clinical observations, patient history, and epidemiological information. The expected result is Negative.  Fact Sheet for Patients:  PinkCheek.be  Fact Sheet for Healthcare Providers:  GravelBags.it  This  test is no t yet approved or cleared by the [place] and  has been authorized for detection and/or diagnosis of SARS-CoV-2 by FDA under an Emergency Use Authorization (EUA). This EUA will remain  in effect (meaning this test can be used) for the duration of the COVID-19 declaration under Section 564(b)(1) of the Act, 21 U.S.C. section 360bbb-3(b)(1), unless the authorization is terminated or revoked sooner.     Influenza A by PCR NEGATIVE NEGATIVE Final   Influenza B by PCR NEGATIVE NEGATIVE Final    Comment: (NOTE) The Xpert Xpress SARS-CoV-2/FLU/RSV assay is intended as an aid in  the diagnosis of influenza from Nasopharyngeal swab specimens and  should not be used as a sole basis for treatment. Nasal washings and  aspirates are unacceptable for Xpert Xpress SARS-CoV-2/FLU/RSV  testing.  Fact Sheet for Patients: PinkCheek.be  Fact Sheet for Healthcare Providers: GravelBags.it  This test is not yet approved or cleared by the [place] FDA and  has been authorized for detection and/or diagnosis of SARS-CoV-2 by  FDA under an Emergency Use Authorization (EUA). This EUA will remain  in effect (meaning this test can be used) for the duration of the  Covid-19 declaration under Section 564(b)(1) of the Act, 21  U.S.C. section 360bbb-3(b)(1), unless the authorization is   terminated or revoked. Performed at [facility], [place] [address], [place], [place] [address]      Radiological Exams on Admission: CT Head Wo Contrast  Result Date: 04/18/2020 CLINICAL DATA:  69 year old male with altered mental status. EXAM: CT HEAD WITHOUT CONTRAST TECHNIQUE: Contiguous axial images were obtained from the base of the skull through the vertex without intravenous contrast. COMPARISON:  Head CT dated 10/14/2017. FINDINGS: Evaluation of this exam is limited due to motion artifact. Brain: Mild age-related atrophy and chronic microvascular ischemic changes. There is no acute intracranial hemorrhage. No mass effect midline shift no extra-axial fluid collection. There is an apparent empty sella. Vascular: No hyperdense vessel or unexpected calcification. Skull: Normal. Negative for fracture or focal lesion. Sinuses/Orbits: No acute finding. Other: None IMPRESSION: 1. No acute intracranial pathology. 2. Mild age-related atrophy and chronic microvascular ischemic changes. Electronically Signed   By: Anner Crete M.D.   On: 04/18/2020 18:11   MR BRAIN WO CONTRAST  Result Date: 04/19/2020 CLINICAL DATA:  Encephalopathy with right facial droop EXAM: MRI HEAD WITHOUT CONTRAST TECHNIQUE: Multiplanar, multiecho pulse sequences of the brain and surrounding structures were obtained without intravenous contrast. COMPARISON:  None. FINDINGS: An abbreviated protocol was utilized consisting of coronal and axial diffusion-weighted imaging only. There is no acute ischemia. No midline shift or other mass effect. IMPRESSION: No acute ischemia. Electronically Signed   By: Ulyses Jarred M.D.   On: 04/19/2020 01:05   DG Pelvis Portable  Result Date: 04/18/2020 CLINICAL DATA:  MRI screening examination EXAM: PORTABLE PELVIS 1-2 VIEWS COMPARISON:  None. FINDINGS: There is no evidence of pelvic fracture or diastasis. No pelvic bone lesions are seen. No intrapelvic metallic foreign body identified.  IMPRESSION: No intrapelvic metallic foreign body. Electronically Signed   By: Fidela Salisbury MD   On: 04/18/2020 22:00   DG Chest Portable 1 View  Result Date: 04/18/2020 CLINICAL DATA:  69 year old male with altered mental status. EXAM: PORTABLE CHEST 1 VIEW COMPARISON:  Chest radiograph dated 03/15/2020 FINDINGS: Left lung base linear and platelike atelectasis. No focal consolidation, pleural effusion, pneumothorax. The cardiac silhouette is within limits. No acute osseous pathology. IMPRESSION: No active disease. Electronically Signed  By: Anner Crete M.D.   On: 04/18/2020 18:08   DG Abd Portable 1 View  Result Date: 04/18/2020 CLINICAL DATA:  MRI screening examination EXAM: PORTABLE ABDOMEN - 1 VIEW COMPARISON:  None. FINDINGS: The bowel gas pattern is normal. No radio-opaque calculi or other significant radiographic abnormality are seen. No intra-abdominal metallic foreign body identified. IMPRESSION: No intra-abdominal metallic foreign body. Electronically Signed   By: Fidela Salisbury MD   On: 04/18/2020 22:00    EKG: Independently reviewed. Sinus tachycardia (rate 100), peaked T-waves.   Assessment/Plan  1. Acute metabolic encephalopathy  - Presents from SNF with ~24 hours of AMS and is found to be responding to noxious stimuli only  - No acute findings noted on head CT or MRI brain, ammonia level normal, TSH pending, no seizure-like activity   - He appears hypovolemic on admission with marked electrolyte abnormalities on admission and is improving with IVF hydration  - Continue IVF hydration, follow-up TSH, check B12, folate, and RPR, continue supportive care, delirium precautions    2. Acute kidney injury; hyperkalemia; acidosis  - BUN is 66 and SCr 1.65 on admission, up from 39 and 0.92 last month; potassium is 6.0 with peaked T-waves; bicarbonate is 16 with AG of 16  - He appears hypovolemic and acute prerenal azotemia most likely  - Lokelma was ordered hours ago but never  given, likely due to decreased LOC  - Check FENa and renal US, continue IVF hydration, administer bicarbonate, continue cardiac monitoring, renally-dose medications, repeat chem panel and check serial potassium levels if still elevated   3. Schizophrenia  - Pharmacy medication-reconciliation is pending  - Treat as-needed for now   4. Insulin-dependent DM  - A1c was 7.6% in October 2021  - Continue CBGs and insulin   5. Anemia   - Hgb is 9.6 on admission, improved from last month with no bleeding noted  - Monitor     DVT prophylaxis: Lovenox  Code Status: Full  Family Communication: Discussed with patient  Disposition Plan:  Patient is from: SNF  Anticipated d/c is to: SNF  Anticipated d/c date is: 11/17 or 04/21/20 Patient currently: Pending improvement in hyperkalemia and mental status  Consults called: None  Admission status: Observation     Vianne Bulls, MD [facility]  04/19/2020, 2:08 AM

## 2020-04-19 NOTE — Progress Notes (Signed)
Patient removed all covers and gown; pt redressed

## 2020-04-19 NOTE — ED Notes (Signed)
Attempted report 

## 2020-04-19 NOTE — ED Notes (Signed)
Date and time results received: 04/19/20 1745  Test: Potassium Critical Value: 6.8  Name of Provider Notified: Masoud  Orders Received? Or Actions Taken?: meds ordered and new lab specimen sent to lab

## 2020-04-19 NOTE — ED Notes (Signed)
Patient transported to MRI 

## 2020-04-19 NOTE — ED Provider Notes (Signed)
I assumed care in signout to follow-up on MRI.  MRI brain is negative. Discussed with Dr. Myna Hidalgo for admission Patient is awake alert but minimally interactive He is afebrile BP 121/72 (BP Location: Left Arm)   Pulse 90   Temp 98 F (36.7 C) (Rectal)   Resp 13   Ht 1.93 m (6\' 4" )   Wt 90 kg   SpO2 100%   BMI 24.15 kg/m     [name], MD 04/19/20 0145

## 2020-04-19 NOTE — ED Notes (Signed)
Legal Guardian [name] updated on pt status and plan of care Extra numbers to call if she is unenviable to consent [name] [phone] Main On-call # [phone]

## 2020-04-19 NOTE — ED Notes (Signed)
Pt returned from CT scan.  Pt still unable to answer questions or follow commands Respirations are even and non-labored  Skin is warm, dry and intact Does not appear in distress

## 2020-04-19 NOTE — Progress Notes (Signed)
PROGRESS NOTE    Scott [name]  FTD:[id number] DOB: 1951/03/19 DOA: 04/18/2020 PCP: Lauretta Grill, NP    Chief Complaint  Patient presents with  . Altered Mental Status    Brief Narrative:  69 year old male with PMH of Chronic Dementia and Schizophrenia who presented to the ED on 11/15 with altered mental status.  Per nursing staff, he stopped eating his lunch and stopped answering questions.  Lactic acid was elevated at 3 mmol/L.  Ammonia level was normal.  COVID was negative.  Patient was given 2L of Normal Saline followed by maintenance fluids.   Assessment & Plan:   Principal Problem:   Metabolic encephalopathy Active Problems:   Schizophrenia, undifferentiated (HCC)   Diabetes ([place])   AKI (acute kidney injury) ([place])   Hyperkalemia   Dementia (HCC)  Acute Toxic Metabolic Encephalopathy / Chronic Dementia / Schizophrenia - dehydration vs other: MRI of brain showed no acute abnormality.  TSH, B12, Folate, RPR ok. - Continue fluids. - Monitor mental status for improvement.  Acute Kidney Injury: Creatinine 1.65 to 1.83 mg/dL, Lactate 3 mmol/L, Urinary Sodium 13 - consistent with pre-renal injury. - Renal Ultrasound showed no hydronephrosis or obstruction. - Change fluids to LR.  Repeat BMP at 1800.  Hyperkalemia:   - Give IV Calcium 2g x once. - Give IV Regular Insulin 10 units plus Dextrose x once. - Bicarbonate is 18 - give amp of bicarbonate. - Bowel regimen was started as below.  Lactic Acidosis, resolved with fluids  Multiple Hepatic Masses - Check Abdominal CT. - Consult Heme/Onc, appreciate assistance and recommendations.  Mild Colonic Ileus -  Most likely due to chronic antipsychotic use - Start bowel regimen Miralax and Senokot.  If no BM in next 24 hours, can add MOM or Lactulose.  Diabetes Mellitus Type 2: A1C 7.6% - SSi with POC glucose q6hrs.  BPH - Foley in place. - Flomax on discharge.  Atherosclerosis - Aspirin and Statin   DVT  prophylaxis: Lovenox Code Status: Full Family Communication: I called Legal Guardian [name] but was unable to get in touch.  I left a voicemail to please call back to hospital. Disposition:   Status is: Inpatient  Remains inpatient appropriate because:Inpatient level of care appropriate due to severity of illness   Dispo: The patient is from: SNF              Anticipated d/c is to: SNF              Anticipated d/c date is: 2 days              Patient currently is not medically stable to d/c.     Consultants:   Hematology/Oncology  Procedures:   None  Antimicrobials:   None   Subjective: 69 year old chronically ill appearing male lying in bed in no respiratory distress.  Patient cannot answer questions or follow commands.  I tried contacting guardian to establish a baseline mental status but was unable to get in contact.    I left a voicemail to call back hospital.  Objective: Vitals:   04/19/20 1545 04/19/20 1600 04/19/20 1615 04/19/20 1630  BP: 120/77  124/87   Pulse: 84 85 92 95  Resp: 11 12 18 19   Temp:      TempSrc:      SpO2: 99% 99% 96% 100%  Weight:      Height:        Intake/Output Summary (Last 24 hours) at 04/19/2020 1720  Last data filed at 04/19/2020 0334 Gross per 24 hour  Intake 2000 ml  Output --  Net 2000 ml   Filed Weights   04/18/20 1717  Weight: 90 kg    Examination:  General exam: chronically ill appearing lying in bed, in NAD, unable to answer questions or follow commands. Respiratory system: No wheezes or crackles. Respiratory effort normal. Cardiovascular system: RRR, did not appreciate any murmur, no JVD. No pedal edema. Gastrointestinal system: Abdomen has mild tenderness in RUQ, soft, nondistended. hepatomegaly Normal bowel sounds heard. Central nervous system: No alert or oriented.  No focal deficits. Extremities: Symmetric 5 x 5 power. Skin: No rashes or lesions. Psych: Not anxious or agitated.  Data Reviewed:  I have personally reviewed following labs and imaging studies  CBC: Recent Labs  Lab 04/18/20 1718 04/19/20 0318  WBC 9.0 8.3  NEUTROABS 7.2  --   HGB 9.6* 9.3*  HCT 33.4* 32.2*  MCV 88.6 87.7  PLT 398 409*    Basic Metabolic Panel: Recent Labs  Lab 04/18/20 1718 04/19/20 0319 04/19/20 0913  NA 134* 136  --   K 6.0* 6.1* 5.3*  CL 102 105  --   CO2 16* 18*  --   GLUCOSE 163* 127*  --   BUN 66* 68*  --   CREATININE 1.65* 1.83*  --   CALCIUM 9.2 9.3  --     GFR: Estimated Creatinine Clearance: 47.4 mL/min (A) (by C-G formula based on SCr of 1.83 mg/dL (H)).  Liver Function Tests: Recent Labs  Lab 04/18/20 1718  AST 46*  ALT 26  ALKPHOS 389*  BILITOT 0.9  PROT 7.5  ALBUMIN 2.5*    CBG: Recent Labs  Lab 04/19/20 0751 04/19/20 1147 04/19/20 1620  GLUCAP 106* 96 91     Recent Results (from the past 240 hour(s))  Blood culture (routine x 2)     Status: None (Preliminary result)   Collection Time: 04/18/20  5:30 PM   Specimen: BLOOD RIGHT WRIST  Result Value Ref Range Status   Specimen Description BLOOD RIGHT WRIST  Final   Special Requests   Final    BOTTLES DRAWN AEROBIC AND ANAEROBIC Blood Culture results may not be optimal due to an inadequate volume of blood received in culture bottles   Culture   Final    NO GROWTH < 24 HOURS Performed at [facility], [place] [address], [place], [place] [address]    Report Status PENDING  Incomplete  Blood culture (routine x 2)     Status: None (Preliminary result)   Collection Time: 04/18/20  6:15 PM   Specimen: BLOOD LEFT WRIST  Result Value Ref Range Status   Specimen Description BLOOD LEFT WRIST  Final   Special Requests   Final    BOTTLES DRAWN AEROBIC AND ANAEROBIC Blood Culture results may not be optimal due to an inadequate volume of blood received in culture bottles   Culture   Final    NO GROWTH < 24 HOURS Performed at [facility], [place] [address], [place], [place] [address]    Report  Status PENDING  Incomplete  Respiratory Panel by RT PCR (Flu A&B, Covid) - Urine, Random     Status: None   Collection Time: 04/18/20  6:15 PM   Specimen: Urine, Random; Nasopharyngeal  Result Value Ref Range Status   SARS Coronavirus 2 by RT PCR NEGATIVE NEGATIVE Final    Comment: (NOTE) SARS-CoV-2 target nucleic acids are NOT DETECTED.  The SARS-CoV-2 RNA is generally detectable in upper respiratoy specimens during the acute phase of infection. The lowest concentration of SARS-CoV-2 viral copies this assay can detect is 131 copies/mL. A negative result does not preclude SARS-Cov-2 infection and should not be used as the sole basis for treatment or other patient management decisions. A negative result may occur with  improper specimen collection/handling, submission of specimen other than nasopharyngeal swab, presence of viral mutation(s) within the areas targeted by this assay, and inadequate number of viral copies (<131 copies/mL). A negative result must be combined with clinical observations, patient history, and epidemiological information. The expected result is Negative.  Fact Sheet for Patients:  PinkCheek.be  Fact Sheet for Healthcare Providers:  GravelBags.it  This test is no t yet approved or cleared by the [place] FDA and  has been authorized for detection and/or diagnosis of SARS-CoV-2 by FDA under an Emergency Use Authorization (EUA). This EUA will remain  in effect (meaning this test can be used) for the duration of the COVID-19 declaration under Section 564(b)(1) of the Act, 21 U.S.C. section 360bbb-3(b)(1), unless the authorization is terminated or revoked sooner.     Influenza A by PCR NEGATIVE NEGATIVE Final   Influenza B by PCR NEGATIVE NEGATIVE Final    Comment: (NOTE) The Xpert Xpress SARS-CoV-2/FLU/RSV assay is intended as an aid in  the diagnosis of influenza from Nasopharyngeal swab  specimens and  should not be used as a sole basis for treatment. Nasal washings and  aspirates are unacceptable for Xpert Xpress SARS-CoV-2/FLU/RSV  testing.  Fact Sheet for Patients: PinkCheek.be  Fact Sheet for Healthcare Providers: GravelBags.it  This test is not yet approved or cleared by the [place] FDA and  has been authorized for detection and/or diagnosis of SARS-CoV-2 by  FDA under an Emergency Use Authorization (EUA). This EUA will remain  in effect (meaning this test can be used) for the duration of the  Covid-19 declaration under Section 564(b)(1) of the Act, 21  U.S.C. section 360bbb-3(b)(1), unless the authorization is  terminated or revoked. Performed at [facility], [place] [address], [place],  [address]          Radiology Studies: CT Head Wo Contrast  Result Date: 04/18/2020 CLINICAL DATA:  69 year old male with altered mental status. EXAM: CT HEAD WITHOUT CONTRAST TECHNIQUE: Contiguous axial images were obtained from the base of the skull through the vertex without intravenous contrast. COMPARISON:  Head CT dated 10/14/2017. FINDINGS: Evaluation of this exam is limited due to motion artifact. Brain: Mild age-related atrophy and chronic microvascular ischemic changes. There is no acute intracranial hemorrhage. No mass effect midline shift no extra-axial fluid collection. There is an apparent empty sella. Vascular: No hyperdense vessel or unexpected calcification. Skull: Normal. Negative for fracture or focal lesion. Sinuses/Orbits: No acute finding. Other: None IMPRESSION: 1. No acute intracranial pathology. 2. Mild age-related atrophy and chronic microvascular ischemic changes. Electronically Signed   By: Anner Crete M.D.   On: 04/18/2020 18:11   MR BRAIN WO CONTRAST  Result Date: 04/19/2020 CLINICAL DATA:  Encephalopathy with right facial droop EXAM: MRI HEAD WITHOUT CONTRAST  TECHNIQUE: Multiplanar, multiecho pulse sequences of the brain and surrounding structures were obtained without intravenous contrast. COMPARISON:  None. FINDINGS: An abbreviated protocol was utilized consisting of coronal and axial diffusion-weighted imaging only. There is no acute ischemia. No midline shift or other mass effect. IMPRESSION: No acute ischemia. Electronically Signed   By: Ulyses Jarred M.D.   On:  04/19/2020 01:05   CT ABDOMEN PELVIS W CONTRAST  Result Date: 04/19/2020 CLINICAL DATA:  Multiple liver metastases on a recent renal ultrasound. Unknown primary. Smoker. EXAM: CT ABDOMEN AND PELVIS WITH CONTRAST TECHNIQUE: Multidetector CT imaging of the abdomen and pelvis was performed using the standard protocol following bolus administration of intravenous contrast. CONTRAST:  67mL OMNIPAQUE IOHEXOL 300 MG/ML  SOLN COMPARISON:  Renal ultrasound obtained earlier today. FINDINGS: Lower chest: Clear lung bases. Normal sized heart. Proximal coronary artery calcifications. Hepatobiliary: Large number of low density masses of varying sizes. The largest is a confluence of masses in the left lobe, measuring 13.4 x 10.6 cm on image number 37 series 3. Possible sludge and thin internal septation or Phrygian cap in the gallbladder with no calcified gallstones visualized. No gallbladder wall thickening or pericholecystic fluid. Pancreas: Unremarkable. No pancreatic ductal dilatation or surrounding inflammatory changes. Spleen: Normal in size without focal abnormality. Adrenals/Urinary Tract: Normal appearing adrenal glands. Small right renal cysts. Small left renal cyst, poorly defined due to poor contrast opacification of the kidneys. Unremarkable urinary bladder and ureters. Stomach/Bowel: Distended colon with prominent stool and gas with no obstructing mass visualized. The sigmoid colon is redundant. Right lower quadrant small bowel/colon anastomosis. The appendix is not visualized and is most likely  surgically absent following bowel resection. Vascular/Lymphatic: Mild atheromatous arterial calcifications. No enlarged lymph nodes. Reproductive: Moderately enlarged prostate gland. Other: Midline surgical scar. Musculoskeletal: Left tissue will bone island. Lumbar and lower thoracic spine degenerative changes. L5 vertebral hemangiomas and L4 Schmorl's node. No evidence of bony metastatic disease. IMPRESSION: 1. Large number of liver metastases. 2. No primary source of malignancy identified in the abdomen or pelvis. 3. Distended colon with prominent stool and gas with no obstructing mass visualized. This most likely reflects mild colonic ileus. 4. Moderately enlarged prostate gland. 5. Proximal coronary artery atheromatous calcifications. Electronically Signed   By: Claudie Revering M.D.   On: 04/19/2020 13:06   US RENAL  Result Date: 04/19/2020 CLINICAL DATA:  Acute renal insufficiency EXAM: RENAL / URINARY TRACT ULTRASOUND COMPLETE COMPARISON:  None. FINDINGS: Right Kidney: Renal measurements: 11.3 x 5.3 x 6.0 cm = volume: 188 mL. The lower pole of the right kidney is obscured by overlying bowel gas. Echogenicity within normal limits. No mass or hydronephrosis visualized. Left Kidney: Obscured by overlying bowel gas Bladder: Appears normal for degree of bladder distention. Other: Multiple ill-defined masses are seen scattered throughout both hepatic lobes, not fully characterized on this examination. IMPRESSION: Multiple hepatic masses, not well characterized on this examination. Dedicated CT examination of the abdomen and pelvis with contrast is recommended for further evaluation. Limited examination without visualization of the left kidney. Normal appearance of the visualized right kidney. Electronically Signed   By: Fidela Salisbury MD   On: 04/19/2020 03:46   DG Pelvis Portable  Result Date: 04/18/2020 CLINICAL DATA:  MRI screening examination EXAM: PORTABLE PELVIS 1-2 VIEWS COMPARISON:  None. FINDINGS:  There is no evidence of pelvic fracture or diastasis. No pelvic bone lesions are seen. No intrapelvic metallic foreign body identified. IMPRESSION: No intrapelvic metallic foreign body. Electronically Signed   By: Fidela Salisbury MD   On: 04/18/2020 22:00   DG Chest Portable 1 View  Result Date: 04/18/2020 CLINICAL DATA:  69 year old male with altered mental status. EXAM: PORTABLE CHEST 1 VIEW COMPARISON:  Chest radiograph dated 03/15/2020 FINDINGS: Left lung base linear and platelike atelectasis. No focal consolidation, pleural effusion, pneumothorax. The cardiac silhouette is within limits. No acute osseous  pathology. IMPRESSION: No active disease. Electronically Signed   By: Anner Crete M.D.   On: 04/18/2020 18:08   DG Abd Portable 1 View  Result Date: 04/18/2020 CLINICAL DATA:  MRI screening examination EXAM: PORTABLE ABDOMEN - 1 VIEW COMPARISON:  None. FINDINGS: The bowel gas pattern is normal. No radio-opaque calculi or other significant radiographic abnormality are seen. No intra-abdominal metallic foreign body identified. IMPRESSION: No intra-abdominal metallic foreign body. Electronically Signed   By: Fidela Salisbury MD   On: 04/18/2020 22:00        Scheduled Meds: . enoxaparin (LOVENOX) injection  40 mg Subcutaneous Q24H  . insulin aspart  0-5 Units Subcutaneous QHS  . insulin aspart  0-9 Units Subcutaneous TID WC  . polyethylene glycol  17 g Oral Daily  . senna  1 tablet Oral Daily  . sodium zirconium cyclosilicate  10 g Oral Once   Continuous Infusions: . lactated ringers 125 mL/hr at 04/19/20 1235     LOS: 0 days    Time spent: 30 minutes    George Hugh, MD [facility]   To contact the attending provider between 7A-7P or the covering provider during after hours 7P-7A, please log into the web site [email or web address] and access using universal [place] password for that web site. If you do not have the password, please call the hospital  operator.  04/19/2020, 5:20 PM

## 2020-04-20 ENCOUNTER — Inpatient Hospital Stay (HOSPITAL_COMMUNITY): Payer: Medicare Other

## 2020-04-20 DIAGNOSIS — G9341 Metabolic encephalopathy: Secondary | ICD-10-CM | POA: Diagnosis not present

## 2020-04-20 LAB — MRSA PCR SCREENING: MRSA by PCR: POSITIVE — AB

## 2020-04-20 LAB — GLUCOSE, CAPILLARY
Glucose-Capillary: 100 mg/dL — ABNORMAL HIGH (ref 70–99)
Glucose-Capillary: 144 mg/dL — ABNORMAL HIGH (ref 70–99)
Glucose-Capillary: 133 mg/dL — ABNORMAL HIGH (ref 70–99)
Glucose-Capillary: 136 mg/dL — ABNORMAL HIGH (ref 70–99)
Glucose-Capillary: 130 mg/dL — ABNORMAL HIGH (ref 70–99)
Glucose-Capillary: 170 mg/dL — ABNORMAL HIGH (ref 70–99)

## 2020-04-20 LAB — BASIC METABOLIC PANEL
Anion gap: 9 (ref 5–15)
BUN: 52 mg/dL — ABNORMAL HIGH (ref 8–23)
CO2: 20 mmol/L — ABNORMAL LOW (ref 22–32)
Calcium: 9.3 mg/dL (ref 8.9–10.3)
Chloride: 110 mmol/L (ref 98–111)
Creatinine, Ser: 0.91 mg/dL (ref 0.61–1.24)
GFR, Estimated: >60 mL/min (ref >60)
Glucose, Bld: 93 mg/dL (ref 70–99)
Potassium: 5 mmol/L (ref 3.5–5.1)
Sodium: 139 mmol/L (ref 135–145)

## 2020-04-20 LAB — CBC
HCT: 26.8 % — ABNORMAL LOW (ref 39.0–52.0)
Hemoglobin: 8 g/dL — ABNORMAL LOW (ref 13.0–17.0)
MCH: 26 pg (ref 26.0–34.0)
MCHC: 29.9 g/dL — ABNORMAL LOW (ref 30.0–36.0)
MCV: 87 fL (ref 80.0–100.0)
Platelets: 336 10*3/uL (ref 150–400)
RBC: 3.08 MIL/uL — ABNORMAL LOW (ref 4.22–5.81)
RDW: 18.6 % — ABNORMAL HIGH (ref 11.5–15.5)
WBC: 7 10*3/uL (ref 4.0–10.5)
nRBC: 0.3 % — ABNORMAL HIGH (ref 0.0–0.2)

## 2020-04-20 LAB — HIV ANTIBODY (ROUTINE TESTING W REFLEX): HIV Screen 4th Generation wRfx: NONREACTIVE

## 2020-04-20 MED ORDER — IOHEXOL 300 MG/ML  SOLN
75.0000 mL | Freq: Once | INTRAMUSCULAR | Status: AC | PRN
  Administered 2020-04-20: 75 mL via INTRAVENOUS

## 2020-04-20 MED ORDER — CHLORHEXIDINE GLUCONATE CLOTH 2 % EX PADS
6.0000 | MEDICATED_PAD | Freq: Every day | CUTANEOUS | Status: DC
  Administered 2020-04-20 – 2020-04-22 (×3): 6 via TOPICAL

## 2020-04-20 MED ORDER — MUPIROCIN 2 % EX OINT
1.0000 "application " | TOPICAL_OINTMENT | Freq: Two times a day (BID) | CUTANEOUS | Status: DC
  Administered 2020-04-20 – 2020-04-21 (×3): 1 via NASAL
  Filled 2020-04-20: qty 22

## 2020-04-20 NOTE — Progress Notes (Addendum)
PROGRESS NOTE  Scott [name]  DOB: [birth year]  PCP: Lauretta Grill, NP IWL:[id number]  DOA: 04/18/2020  LOS: 1 day   Chief Complaint  Patient presents with   Altered Mental Status   Brief narrative: 69 year old male with PMH significant for dementia, schizophrenia, insulin-dependent diabetes mellitus, hypertension, tardive dyskinesia, history of colon cancer status post resection in [place] few years ago. 11/15, patient was sent to the ED from SNF for evaluation of altered mental status.    On admission, patient was afebrile, maintaining oxygen saturation, hemodynamically stable. However blood work showed elevated lactic acid level of 3, BUN/creatinine elevated to 66/1.65, up from 0.9 a month ago, potassium elevated to 6.  CBC with normocytic anemia Chest x-ray normal.  CT head normal.  MRI brain normal. Blood culture and urine culture were obtained.  Patient was given IV hydration and admitted to hospitalist service.  Subjective: Patient was seen and examined this morning. Opens eyes on verbal command.  Tries to answer questions but not able to understand.  He did not follow any motor command to me.  Assessment/Plan: Acute Toxic Metabolic Encephalopathy -In a background of dementia, schizophrenia. -Likely secondary to dehydration. -No evidence of infection.  CT scan of head and MRI of brain normal. -TSH, B12, Folate, RPR ok. -Adequately hydrated.  Encourage oral appetite and hydration. -Monitor mental status for improvement.  Acute Kidney Injury Lactic acidosis -Creatinine elevated 1.65, lactic acid elevated to 3.2, no evidence of infection. -Blood changes likely secondary to dehydration. -Both have normalized now. -Renal ultrasound did not show any evidence of obstruction. Recent Labs    02/16/20 2209 03/13/20 1328 03/16/20 1543 03/27/20 1834 03/27/20 2237 04/18/20 1718 04/19/20 0319 04/19/20 1748 04/20/20 0312  BUN 26* 22 15 44* 39* 66* 68* 61* 52*    CREATININE 0.91 1.09 0.90 1.14 0.92 1.65* 1.83* 1.24 0.91   Recent Labs  Lab 04/18/20 1718 04/18/20 2001 04/19/20 0319 04/19/20 0913  LATICACIDVEN 3.0* 3.2* 2.3* 1.5   Hyperkalemia:   -Improved with calcium, insulin and dextrose. Recent Labs  Lab 04/19/20 0319 04/19/20 0913 04/19/20 1611 04/19/20 1748 04/20/20 0312  K 6.1* 5.3* 6.8* 5.2* 5.0   Acute on chronic anemia -Baseline hemoglobin seems to be between 9-10.  Down to 8 today.  No active bleeding. -Continue to monitor. Recent Labs    02/16/20 2209 03/13/20 1328 04/18/20 1718 04/19/20 0318 04/20/20 0312  HGB 9.8* 8.5* 9.6* 9.3* 8.0*   Metastatic liver masses History of colon cancer -According to patient's legal guardian [name], patient has a history of colon cancer status post resection few years ago in [place], [place].  He did not need chemo or radiation.   -In this admission, patient had a CT scan of abdomen on 11/16 which showed a large number of liver metastasis without primary source identified.   -Discussed with oncologist on call Dr. Lorenso Courier.  Recommended a CT chest with contrast for staging.  Also recommended a biopsy of one of the liver masses by IR.  Order placed.  Also order for n.p.o. after midnight just in case.  Mild Colonic Ileus  -CT abdomen noted distended colon with prominent stool and gas with no obstructing mass likely reflecting mild colonic ileus. Most likely due to chronic antipsychotic use. -Continue bowel regimen Miralax and Senokot.  If no BM in next 24 hours, can add MOM or Lactulose.  Diabetes Mellitus Type 2 -A1c 7.6 on 10/10. -Continue sliding scale insulin with Accu-Cheks. Recent Labs  Lab 04/19/20 1620 04/19/20  2115 04/20/20 0640 04/20/20 1122 04/20/20 1308  GLUCAP 91 82 100* 144* 133*   BPH - Foley in place. ?  Acute on chronic Foley - Flomax on discharge.  Atherosclerosis -On aspirin and Statin  Mobility: Long-term nursing home resident. Code Status:    Code Status: Full Code  Nutritional status: Body mass index is 24.15 kg/m.     Diet Order            Diet heart healthy/carb modified Room service appropriate? Yes; Fluid consistency: Thin  Diet effective now                 DVT prophylaxis: enoxaparin (LOVENOX) injection 40 mg Start: 04/19/20 0600   Antimicrobials:  None Fluid: None Consultants: Oncology paged. Family Communication:  None at bedside  Status is: Inpatient  Remains inpatient appropriate because: Anemia, May need malignancy work-up,   Dispo: The patient is from: SNF              Anticipated d/c is to: SNF              Anticipated d/c date is: 2 days              Patient currently is not medically stable to d/c.    Infusions:    Scheduled Meds:  Chlorhexidine Gluconate Cloth  6 each Topical Q0600   enoxaparin (LOVENOX) injection  40 mg Subcutaneous Q24H   insulin aspart  0-5 Units Subcutaneous QHS   insulin aspart  0-9 Units Subcutaneous TID WC   mupirocin ointment  1 application Nasal BID   polyethylene glycol  17 g Oral Daily   senna  1 tablet Oral Daily   sodium zirconium cyclosilicate  10 g Oral Once    Antimicrobials: Anti-infectives (From admission, onward)   None      PRN meds: acetaminophen **OR** acetaminophen, haloperidol lactate, ondansetron **OR** ondansetron (ZOFRAN) IV, senna-docusate   Objective: Vitals:   04/20/20 0812 04/20/20 1119  BP: 114/79 113/70  Pulse: 93 91  Resp: 18 16  Temp: 97.8 F (36.6 C) (!) 97.3 F (36.3 C)  SpO2: 100% 100%    Intake/Output Summary (Last 24 hours) at 04/20/2020 1449 Last data filed at 04/20/2020 0000 Gross per 24 hour  Intake 250 ml  Output 550 ml  Net -300 ml   Filed Weights   04/18/20 1717  Weight: 90 kg   Weight change:  Body mass index is 24.15 kg/m.   Physical Exam: General exam: Appears calm and comfortable.  Not in physical distress Skin: No rashes, lesions or ulcers. HEENT: Atraumatic, normocephalic,  supple neck, no obvious bleeding Lungs: Clear to auscultation bilaterally CVS: Regular rate and rhythm, no murmur GI/Abd soft, nontender, nondistended, bowel sound present CNS: Opens eyes on verbal command, unable to understand conversation, does not follow motor command Psychiatry: Depressed look Extremities: No pedal edema, no calf tenderness  Data Review: I have personally reviewed the laboratory data and studies available.  Recent Labs  Lab 04/18/20 1718 04/19/20 0318 04/20/20 0312  WBC 9.0 8.3 7.0  NEUTROABS 7.2  --   --   HGB 9.6* 9.3* 8.0*  HCT 33.4* 32.2* 26.8*  MCV 88.6 87.7 87.0  PLT 398 409* 336   Recent Labs  Lab 04/18/20 1718 04/18/20 1718 04/19/20 0319 04/19/20 0913 04/19/20 1611 04/19/20 1748 04/20/20 0312  NA 134*  --  136  --   --  137 139  K 6.0*   < > 6.1* 5.3* 6.8* 5.2* 5.0  CL 102  --  105  --   --  108 110  CO2 16*  --  18*  --   --  18* 20*  GLUCOSE 163*  --  127*  --   --  102* 93  BUN 66*  --  68*  --   --  61* 52*  CREATININE 1.65*  --  1.83*  --   --  1.24 0.91  CALCIUM 9.2  --  9.3  --   --  9.2 9.3   < > = values in this interval not displayed.    F/u labs ordered.  Signed, Terrilee Croak, MD [facility] 04/20/2020

## 2020-04-21 ENCOUNTER — Inpatient Hospital Stay (HOSPITAL_COMMUNITY): Payer: Medicare Other

## 2020-04-21 ENCOUNTER — Other Ambulatory Visit: Payer: Self-pay

## 2020-04-21 DIAGNOSIS — G9341 Metabolic encephalopathy: Secondary | ICD-10-CM | POA: Diagnosis not present

## 2020-04-21 LAB — BASIC METABOLIC PANEL
Anion gap: 8 (ref 5–15)
BUN: 40 mg/dL — ABNORMAL HIGH (ref 8–23)
CO2: 23 mmol/L (ref 22–32)
Calcium: 9.7 mg/dL (ref 8.9–10.3)
Chloride: 110 mmol/L (ref 98–111)
Creatinine, Ser: 0.76 mg/dL (ref 0.61–1.24)
GFR, Estimated: >60 mL/min (ref >60)
Glucose, Bld: 113 mg/dL — ABNORMAL HIGH (ref 70–99)
Potassium: 4.9 mmol/L (ref 3.5–5.1)
Sodium: 141 mmol/L (ref 135–145)

## 2020-04-21 LAB — CBC
HCT: 28 % — ABNORMAL LOW (ref 39.0–52.0)
Hemoglobin: 8.2 g/dL — ABNORMAL LOW (ref 13.0–17.0)
MCH: 25.5 pg — ABNORMAL LOW (ref 26.0–34.0)
MCHC: 29.3 g/dL — ABNORMAL LOW (ref 30.0–36.0)
MCV: 87 fL (ref 80.0–100.0)
Platelets: 345 10*3/uL (ref 150–400)
RBC: 3.22 MIL/uL — ABNORMAL LOW (ref 4.22–5.81)
RDW: 18.7 % — ABNORMAL HIGH (ref 11.5–15.5)
WBC: 7.7 10*3/uL (ref 4.0–10.5)
nRBC: 0.7 % — ABNORMAL HIGH (ref 0.0–0.2)

## 2020-04-21 LAB — GLUCOSE, CAPILLARY
Glucose-Capillary: 118 mg/dL — ABNORMAL HIGH (ref 70–99)
Glucose-Capillary: 117 mg/dL — ABNORMAL HIGH (ref 70–99)
Glucose-Capillary: 238 mg/dL — ABNORMAL HIGH (ref 70–99)

## 2020-04-21 LAB — PROTIME-INR
INR: 1.5 — ABNORMAL HIGH (ref 0.8–1.2)
Prothrombin Time: 17.4 seconds — ABNORMAL HIGH (ref 11.4–15.2)

## 2020-04-21 MED ORDER — LIDOCAINE HCL (PF) 1 % IJ SOLN
INTRAMUSCULAR | Status: AC
  Filled 2020-04-21: qty 30

## 2020-04-21 MED ORDER — MIDAZOLAM HCL 2 MG/2ML IJ SOLN
INTRAMUSCULAR | Status: AC
  Filled 2020-04-21: qty 2

## 2020-04-21 MED ORDER — FENTANYL CITRATE (PF) 100 MCG/2ML IJ SOLN
INTRAMUSCULAR | Status: AC
  Filled 2020-04-21: qty 2

## 2020-04-21 MED ORDER — MIDAZOLAM HCL 2 MG/2ML IJ SOLN
INTRAMUSCULAR | Status: AC | PRN
  Administered 2020-04-21: 0.5 mg via INTRAVENOUS

## 2020-04-21 MED ORDER — FENTANYL CITRATE (PF) 100 MCG/2ML IJ SOLN
INTRAMUSCULAR | Status: AC | PRN
Start: 2020-04-21 — End: 2020-04-21
  Administered 2020-04-21: 25 ug via INTRAVENOUS

## 2020-04-21 MED ORDER — GELATIN ABSORBABLE 12-7 MM EX MISC
CUTANEOUS | Status: AC
  Filled 2020-04-21: qty 1

## 2020-04-21 NOTE — Progress Notes (Signed)
Soap suds enema performed, stool outpout: smear.

## 2020-04-21 NOTE — Procedures (Signed)
Interventional Radiology Procedure Note  Procedure: [place] RIGHT LIVE MET BX    Complications: None  Estimated Blood Loss:  MIN  Findings: 18 G CORE X 3    M. Daryll Brod, MD

## 2020-04-21 NOTE — Progress Notes (Signed)
PROGRESS NOTE  Scott [name]  DOB: 1950-09-14  PCP: Lauretta Grill, NP PVX:[id number]  DOA: 04/18/2020  LOS: 2 days   Chief Complaint  Patient presents with  . Altered Mental Status   Brief narrative: 69 year old male with PMH significant for dementia, schizophrenia, insulin-dependent diabetes mellitus, hypertension, tardive dyskinesia, history of colon cancer status post resection in [place] few years ago. 11/15, patient was sent to the ED from SNF for evaluation of altered mental status.    On admission, patient was afebrile, maintaining oxygen saturation, hemodynamically stable. However blood work showed elevated lactic acid level of 3, BUN/creatinine elevated to 66/1.65, up from 0.9 a month ago, potassium elevated to 6.  CBC with normocytic anemia Chest x-ray normal.  CT head normal.  MRI brain normal. Blood culture and urine culture were obtained.  Patient was given IV hydration and admitted to hospitalist service.  Subjective: Patient was seen and examined this morning.  Propped up in bed.  Not in distress.  No new symptoms. Confused at baseline.  Assessment/Plan: Acute Toxic Metabolic Encephalopathy -In a background of dementia, schizophrenia. -Likely secondary to dehydration. -No evidence of infection.  CT scan of head and MRI of brain normal. -TSH, B12, Folate, RPR ok. -Adequately hydrated.  Encourage oral appetite and hydration. -Monitor mental status for improvement.  Metastatic liver masses History of colon cancer -According to patient's legal guardian [name], patient has a history of colon cancer status post resection few years ago in [place], [place].  He did not need chemo or radiation.   -In this admission, patient had a CT scan of abdomen on 11/16 which showed a large number of liver metastasis without primary source identified.   -11/17 discussed with oncologist on call Dr. Lorenso Courier.  Recommended a CT chest with contrast for staging.  Also  recommended a biopsy of one of the liver masses by IR.  -CT chest showed metastasis to left proximal humerus, right anterior fourth, fifth, sixth ribs.  No metastases to the lungs.   -IR is planning to do biopsy of the liver mass.    Acute Kidney Injury Lactic acidosis -On admission, creatinine was elevated 1.65, lactic acid elevated to 3.2, no evidence of infection. -Blood changes likely secondary to dehydration. -Both have normalized now. -Renal ultrasound did not show any evidence of obstruction. Recent Labs    02/16/20 2209 03/13/20 1328 03/16/20 1543 03/27/20 1834 03/27/20 2237 04/18/20 1718 04/19/20 0319 04/19/20 1748 04/20/20 0312 04/21/20 0410  BUN 26* 22 15 44* 39* 66* 68* 61* 52* 40*  CREATININE 0.91 1.09 0.90 1.14 0.92 1.65* 1.83* 1.24 0.91 0.76   Recent Labs  Lab 04/18/20 1718 04/18/20 2001 04/19/20 0319 04/19/20 0913  LATICACIDVEN 3.0* 3.2* 2.3* 1.5   Hyperkalemia:   -Improved with calcium, insulin and dextrose. Recent Labs  Lab 04/19/20 0913 04/19/20 1611 04/19/20 1748 04/20/20 0312 04/21/20 0410  K 5.3* 6.8* 5.2* 5.0 4.9   Acute on chronic anemia -Baseline hemoglobin seems to be between 9-10.  Down to 8.2 today.  No active bleeding. -Continue to monitor. Recent Labs    02/16/20 2209 03/13/20 1328 04/18/20 1718 04/19/20 0318 04/20/20 0312 04/21/20 0410  HGB 9.8* 8.5* 9.6* 9.3* 8.0* 8.2*   Mild Colonic Ileus  -CT abdomen noted distended colon with prominent stool and gas with no obstructing mass likely reflecting mild colonic ileus. Most likely due to chronic antipsychotic use. -Continue bowel regimen Miralax and Senokot.  -Can give enema today if no bowel movement with MiraLAX.  Diabetes Mellitus Type 2 -A1c 7.6 on 10/10. -Continue sliding scale insulin with Accu-Cheks. Recent Labs  Lab 04/20/20 1308 04/20/20 1545 04/20/20 1726 04/20/20 2130 04/21/20 0650  GLUCAP 133* 136* 130* 170* 118*   BPH - Foley in place. ?  New versus  old Foley - Flomax on discharge.  Atherosclerosis -On aspirin and Statin  Mobility: Long-term nursing home resident. Code Status:   Code Status: Full Code  Nutritional status: Body mass index is 24.15 kg/m.     Diet Order            Diet NPO time specified  Diet effective midnight                 DVT prophylaxis: enoxaparin (LOVENOX) injection 40 mg Start: 04/19/20 0600   Antimicrobials:  None Fluid: None Consultants: Oncology on the phone, IR Family Communication:  None at bedside  Status is: Inpatient  Remains inpatient appropriate because: Pending biopsy by IR   Dispo: The patient is from: SNF              Anticipated d/c is to: SNF              Anticipated d/c date is: Hopefully tomorrow              Patient currently is not medically stable to d/c.    Infusions:    Scheduled Meds: . Chlorhexidine Gluconate Cloth  6 each Topical Q0600  . enoxaparin (LOVENOX) injection  40 mg Subcutaneous Q24H  . insulin aspart  0-5 Units Subcutaneous QHS  . insulin aspart  0-9 Units Subcutaneous TID WC  . mupirocin ointment  1 application Nasal BID  . polyethylene glycol  17 g Oral Daily  . senna  1 tablet Oral Daily  . sodium zirconium cyclosilicate  10 g Oral Once    Antimicrobials: Anti-infectives (From admission, onward)   None      PRN meds: acetaminophen **OR** acetaminophen, haloperidol lactate, ondansetron **OR** ondansetron (ZOFRAN) IV, senna-docusate   Objective: Vitals:   04/21/20 0328 04/21/20 0907  BP: 115/80 (!) 154/88  Pulse: 95 69  Resp: 18 16  Temp: 98.2 F (36.8 C) 97.6 F (36.4 C)  SpO2: 100% 94%   No intake or output data in the 24 hours ending 04/21/20 1050 Filed Weights   04/18/20 1717  Weight: 90 kg   Weight change:  Body mass index is 24.15 kg/m.   Physical Exam:  General exam: Not in physical distress Skin: No rashes, lesions or ulcers. HEENT: Atraumatic, normocephalic, supple neck, no obvious bleeding Lungs: Clear  to auscultation bilaterally CVS: Regular rate and rhythm, no murmur GI/Abd soft, nontender, nondistended, bowel sound present CNS: Awake, confused at baseline. Psychiatry: Depressed look Extremities: No pedal edema, no calf tenderness  Data Review: I have personally reviewed the laboratory data and studies available.  Recent Labs  Lab 04/18/20 1718 04/19/20 0318 04/20/20 0312 04/21/20 0410  WBC 9.0 8.3 7.0 7.7  NEUTROABS 7.2  --   --   --   HGB 9.6* 9.3* 8.0* 8.2*  HCT 33.4* 32.2* 26.8* 28.0*  MCV 88.6 87.7 87.0 87.0  PLT 398 409* 336 345   Recent Labs  Lab 04/18/20 1718 04/18/20 1718 04/19/20 0319 04/19/20 0319 04/19/20 0913 04/19/20 1611 04/19/20 1748 04/20/20 0312 04/21/20 0410  NA 134*  --  136  --   --   --  137 139 141  K 6.0*   < > 6.1*   < > 5.3* 6.8*  5.2* 5.0 4.9  CL 102  --  105  --   --   --  108 110 110  CO2 16*  --  18*  --   --   --  18* 20* 23  GLUCOSE 163*  --  127*  --   --   --  102* 93 113*  BUN 66*  --  68*  --   --   --  61* 52* 40*  CREATININE 1.65*  --  1.83*  --   --   --  1.24 0.91 0.76  CALCIUM 9.2  --  9.3  --   --   --  9.2 9.3 9.7   < > = values in this interval not displayed.    F/u labs ordered.  Signed, Terrilee Croak, MD [facility] 04/21/2020

## 2020-04-21 NOTE — Consult Note (Signed)
Chief Complaint: Patient was seen in consultation today for image guided liver lesion biopsy Chief Complaint  Patient presents with  . Altered Mental Status    Referring Physician(s): Dorsey,J  Supervising Physician: Daryll Brod  Patient Status: Scott [name] - In-pt  History of Present Illness: Scott [name] is a 69 y.o. male with past medical history of anxiety, dementia, dyslipidemia, GERD, hypertension, schizophrenia, seizures, BPH, vitamin D deficiency diabetes, tardive dyskinesia and prior colon cancer with resection in [place] a few years ago.  He reportedly did not need chemotherapy or radiation therapy.  He was recently admitted to [facility] for evaluation of altered mental status.  Laboratory evaluation on admission showed elevated lactic acid, elevated BUN and creatinine, and elevated potassium.  CBC showed normocytic anemia, chest x-ray CT head MRI brain were normal.  COVID-19 negative.  Renal ultrasound demonstrated multiple liver lesions with subsequent CT C/A/P revealing large number of liver mets with findings of probable mild colonic ileus, mildly enlarged prostate gland and proximal coronary artery calcifications, signs of metastatic involvement of the left proximal humerus, right anterior fourth, fifth and sixth ribs with heterogeneous appearance of some vertebral bodies without discrete lesion.  Request now received from oncology for image guided liver lesion biopsy for further evaluation.  Past Medical History:  Diagnosis Date  . Aggression aggravated 08/20/2018  . Anxiety   . Chronic hyponatremia 05/2013, 11/2013  . Dementia ([place])   . Dyslipidemia   . GERD (gastroesophageal reflux disease)   . Hypertension   . Hypoglycemia   . Schizophrenia ([place])   . Seizures ([place])   . Syncope   . Tardive dyskinesia   . Urinary tract infection   . Vitamin D deficiency     Past Surgical History:  Procedure Laterality Date  . CATARACT EXTRACTION W/PHACO Left 01/10/2016    Procedure: CATARACT EXTRACTION PHACO AND INTRAOCULAR LENS PLACEMENT (IOC);  Surgeon: Birder Robson, MD;  Location: [facility] ORS;  Service: Ophthalmology;  Laterality: Left;  [place] 00:53 AP% 24.2 CDE 12.88 fluid pack lot # [id number]  . EYE SURGERY    . FOOT SURGERY      Allergies: Patient has no known allergies.  Medications: Prior to Admission medications   Medication Sig Start Date End Date Taking? Authorizing Provider  atorvastatin (LIPITOR) 40 MG tablet Take 1 tablet (40 mg total) by mouth daily for 7 days. Patient taking differently: Take 40 mg by mouth at bedtime.  03/28/20  Yes Harvest Dark, MD  benztropine (COGENTIN) 1 MG tablet Take 1 tablet (1 mg total) by mouth 2 (two) times daily for 7 days. 03/28/20  Yes Paduchowski, Lennette Bihari, MD  clonazePAM (KLONOPIN) 0.5 MG tablet Take 1 tablet (0.5 mg total) by mouth 2 (two) times daily for 7 days. 03/28/20  Yes Harvest Dark, MD  divalproex (DEPAKOTE ER) 250 MG 24 hr tablet Take 3 tablets (750 mg total) by mouth daily. 02/22/20  Yes Carrie Mew, MD  famotidine (PEPCID) 20 MG tablet Take 1 tablet (20 mg total) by mouth daily for 7 days. Patient taking differently: Take 20 mg by mouth at bedtime.  03/28/20  Yes Harvest Dark, MD  haloperidol (HALDOL) 2 MG tablet Take 1 tablet (2 mg total) by mouth daily. 03/28/20  Yes Harvest Dark, MD  haloperidol (HALDOL) 5 MG tablet Take 1 tablet (5 mg total) by mouth at bedtime. 03/28/20  Yes Harvest Dark, MD  insulin glargine (LANTUS) 100 UNIT/ML injection Inject 18 Units into the skin at bedtime.   Yes [provider]  insulin glargine (LANTUS) 100 UNIT/ML injection 18 units QHS Patient taking differently: Inject 18 Units into the skin at bedtime.  03/28/20 03/28/21 Yes Harvest Dark, MD  metFORMIN (GLUCOPHAGE) 1000 MG tablet Take 1 tablet (1,000 mg total) by mouth 2 (two) times daily with a meal for 7 days. 03/28/20  Yes Harvest Dark, MD  OLANZapine (ZYPREXA)  15 MG tablet Take 1 tablet (15 mg total) by mouth in the morning and at bedtime. 03/28/20  Yes Harvest Dark, MD  tamsulosin (FLOMAX) 0.4 MG CAPS capsule Take 1 capsule (0.4 mg total) by mouth daily. Patient taking differently: Take 0.4 mg by mouth at bedtime.  03/28/20  Yes Harvest Dark, MD  traZODone (DESYREL) 150 MG tablet Take 1 tablet (150 mg total) by mouth at bedtime. 03/28/20  Yes Harvest Dark, MD  ferrous sulfate 325 (65 FE) MG tablet Take 1 tablet (325 mg total) by mouth 2 (two) times daily with a meal. 04/19/20 05/19/20  George Hugh, MD     No family history on file.  Social History   Socioeconomic History  . Marital status: Single    Spouse name: Not on file  . Number of children: Not on file  . Years of education: Not on file  . Highest education level: Not on file  Occupational History  . Not on file  Tobacco Use  . Smoking status: Current Every Day Smoker    Packs/day: 0.50    Types: Cigarettes  . Smokeless tobacco: Never Used  Vaping Use  . Vaping Use: Never used  Substance and Sexual Activity  . Alcohol use: No  . Drug use: No  . Sexual activity: Not Currently  Other Topics Concern  . Not on file  Social History Narrative  . Not on file   Social Determinants of Health   Financial Resource Strain:   . Difficulty of Paying Living Expenses: Not on file  Food Insecurity:   . Worried About Charity fundraiser in the Last Year: Not on file  . Ran Out of Food in the Last Year: Not on file  Transportation Needs:   . Lack of Transportation (Medical): Not on file  . Lack of Transportation (Non-Medical): Not on file  Physical Activity:   . Days of Exercise per Week: Not on file  . Minutes of Exercise per Session: Not on file  Stress:   . Feeling of Stress : Not on file  Social Connections:   . Frequency of Communication with Friends and Family: Not on file  . Frequency of Social Gatherings with Friends and Family: Not on file  . Attends  Religious Services: Not on file  . Active Member of Clubs or Organizations: Not on file  . Attends Archivist Meetings: Not on file  . Marital Status: Not on file      Review of Systems unable to completely ascertain from patient due to mental status but currently denies pain, respiratory issues, nausea, vomiting or bleeding.  Vital Signs: BP 115/80 (BP Location: Left Arm)   Pulse 95   Temp 98.2 F (36.8 C) (Oral)   Resp 18   Ht 6\' 4"  (1.93 m)   Wt 198 lb 6.6 oz (90 kg)   SpO2 100%   BMI 24.15 kg/m   Physical Exam patient awake, no acute distress.  Chest clear to auscultation bilaterally.  Heart with regular rate and rhythm.  Abdomen soft, positive bowel sounds, nontender.  No lower extremity edema.  Imaging: CT Head Wo Contrast  Result Date: 04/18/2020 CLINICAL DATA:  69 year old male with altered mental status. EXAM: CT HEAD WITHOUT CONTRAST TECHNIQUE: Contiguous axial images were obtained from the base of the skull through the vertex without intravenous contrast. COMPARISON:  Head CT dated 10/14/2017. FINDINGS: Evaluation of this exam is limited due to motion artifact. Brain: Mild age-related atrophy and chronic microvascular ischemic changes. There is no acute intracranial hemorrhage. No mass effect midline shift no extra-axial fluid collection. There is an apparent empty sella. Vascular: No hyperdense vessel or unexpected calcification. Skull: Normal. Negative for fracture or focal lesion. Sinuses/Orbits: No acute finding. Other: None IMPRESSION: 1. No acute intracranial pathology. 2. Mild age-related atrophy and chronic microvascular ischemic changes. Electronically Signed   By: Anner Crete M.D.   On: 04/18/2020 18:11   CT CHEST W CONTRAST  Result Date: 04/21/2020 CLINICAL DATA:  Cancer of unknown primary, staging evaluation. EXAM: CT CHEST WITH CONTRAST TECHNIQUE: Multidetector CT imaging of the chest was performed during intravenous contrast administration.  CONTRAST:  38mL OMNIPAQUE IOHEXOL 300 MG/ML  SOLN COMPARISON:  CT of the abdomen and pelvis of April 19, 2020. FINDINGS: Cardiovascular: Calcified atheromatous plaque of the thoracic aorta. Normal heart size. No pericardial effusion. Central pulmonary vasculature is normal on venous phase assessment. Mediastinum/Nodes: No hilar adenopathy. No mediastinal adenopathy. No axillary lymphadenopathy no thoracic inlet adenopathy. Lungs/Pleura: No consolidation. No sign of pleural effusion. Airways are patent. No suspicious mass in the lung parenchyma. Upper Abdomen: Large hepatic mass similar to recent abdominal CT. Infiltration of much of the liver with tumor is as outlined in previous abdominal CT report. Is no acute upper abdominal findings. Musculoskeletal: Soft tissue mass and expansile changes about the LEFT proximal humerus with extensive periosteal reaction. This area measuring approximately 5.1 x 2.6 cm on image 48 of series 3 relative to the extraosseous portion of this process. Signs of metastatic involvement of the RIGHT anterior fourth rib with expansile changes and soft tissue that shows an indentation along the pleural surface on image 83 of series 3, associated with sclerosis of the rib at this level. Sclerotic lesion in the lateral RIGHT fifth rib, sixth rib and with heterogeneity of some vertebral bodies without discrete lesion, particularly very subtle heterogeneity of T7 particular raising the question of more diffuse osseous involvement. IMPRESSION: 1. Signs of metastatic involvement of the LEFT proximal humerus and RIGHT anterior fourth, fifth and sixth ribs as described. Based on the appearance of the LEFT humerus this area is at risk for pathologic fracture. 2. Heterogeneous appearance of some vertebral bodies without discrete lesion, particularly very subtle heterogeneity of T7 particular raising the question of more diffuse osseous involvement. 3. No evidence of metastatic disease to the lungs.  4. Large hepatic mass similar to recent abdominal CT. Infiltration of much of the liver with tumor is as outlined in previous abdominal CT report. 5. Aortic atherosclerosis. Aortic Atherosclerosis (ICD10-I70.0). Electronically Signed   By: Zetta Bills M.D.   On: 04/21/2020 08:07   MR BRAIN WO CONTRAST  Result Date: 04/19/2020 CLINICAL DATA:  Encephalopathy with right facial droop EXAM: MRI HEAD WITHOUT CONTRAST TECHNIQUE: Multiplanar, multiecho pulse sequences of the brain and surrounding structures were obtained without intravenous contrast. COMPARISON:  None. FINDINGS: An abbreviated protocol was utilized consisting of coronal and axial diffusion-weighted imaging only. There is no acute ischemia. No midline shift or other mass effect. IMPRESSION: No acute ischemia. Electronically Signed   By: Ulyses Jarred M.D.   On: 04/19/2020 01:05   CT ABDOMEN PELVIS W  CONTRAST  Result Date: 04/19/2020 CLINICAL DATA:  Multiple liver metastases on a recent renal ultrasound. Unknown primary. Smoker. EXAM: CT ABDOMEN AND PELVIS WITH CONTRAST TECHNIQUE: Multidetector CT imaging of the abdomen and pelvis was performed using the standard protocol following bolus administration of intravenous contrast. CONTRAST:  56mL OMNIPAQUE IOHEXOL 300 MG/ML  SOLN COMPARISON:  Renal ultrasound obtained earlier today. FINDINGS: Lower chest: Clear lung bases. Normal sized heart. Proximal coronary artery calcifications. Hepatobiliary: Large number of low density masses of varying sizes. The largest is a confluence of masses in the left lobe, measuring 13.4 x 10.6 cm on image number 37 series 3. Possible sludge and thin internal septation or Phrygian cap in the gallbladder with no calcified gallstones visualized. No gallbladder wall thickening or pericholecystic fluid. Pancreas: Unremarkable. No pancreatic ductal dilatation or surrounding inflammatory changes. Spleen: Normal in size without focal abnormality. Adrenals/Urinary Tract:  Normal appearing adrenal glands. Small right renal cysts. Small left renal cyst, poorly defined due to poor contrast opacification of the kidneys. Unremarkable urinary bladder and ureters. Stomach/Bowel: Distended colon with prominent stool and gas with no obstructing mass visualized. The sigmoid colon is redundant. Right lower quadrant small bowel/colon anastomosis. The appendix is not visualized and is most likely surgically absent following bowel resection. Vascular/Lymphatic: Mild atheromatous arterial calcifications. No enlarged lymph nodes. Reproductive: Moderately enlarged prostate gland. Other: Midline surgical scar. Musculoskeletal: Left tissue will bone island. Lumbar and lower thoracic spine degenerative changes. L5 vertebral hemangiomas and L4 Schmorl's node. No evidence of bony metastatic disease. IMPRESSION: 1. Large number of liver metastases. 2. No primary source of malignancy identified in the abdomen or pelvis. 3. Distended colon with prominent stool and gas with no obstructing mass visualized. This most likely reflects mild colonic ileus. 4. Moderately enlarged prostate gland. 5. Proximal coronary artery atheromatous calcifications. Electronically Signed   By: Claudie Revering M.D.   On: 04/19/2020 13:06   US RENAL  Result Date: 04/19/2020 CLINICAL DATA:  Acute renal insufficiency EXAM: RENAL / URINARY TRACT ULTRASOUND COMPLETE COMPARISON:  None. FINDINGS: Right Kidney: Renal measurements: 11.3 x 5.3 x 6.0 cm = volume: 188 mL. The lower pole of the right kidney is obscured by overlying bowel gas. Echogenicity within normal limits. No mass or hydronephrosis visualized. Left Kidney: Obscured by overlying bowel gas Bladder: Appears normal for degree of bladder distention. Other: Multiple ill-defined masses are seen scattered throughout both hepatic lobes, not fully characterized on this examination. IMPRESSION: Multiple hepatic masses, not well characterized on this examination. Dedicated CT  examination of the abdomen and pelvis with contrast is recommended for further evaluation. Limited examination without visualization of the left kidney. Normal appearance of the visualized right kidney. Electronically Signed   By: Fidela Salisbury MD   On: 04/19/2020 03:46   DG Pelvis Portable  Result Date: 04/18/2020 CLINICAL DATA:  MRI screening examination EXAM: PORTABLE PELVIS 1-2 VIEWS COMPARISON:  None. FINDINGS: There is no evidence of pelvic fracture or diastasis. No pelvic bone lesions are seen. No intrapelvic metallic foreign body identified. IMPRESSION: No intrapelvic metallic foreign body. Electronically Signed   By: Fidela Salisbury MD   On: 04/18/2020 22:00   DG Chest Portable 1 View  Result Date: 04/18/2020 CLINICAL DATA:  69 year old male with altered mental status. EXAM: PORTABLE CHEST 1 VIEW COMPARISON:  Chest radiograph dated 03/15/2020 FINDINGS: Left lung base linear and platelike atelectasis. No focal consolidation, pleural effusion, pneumothorax. The cardiac silhouette is within limits. No acute osseous pathology. IMPRESSION: No active disease. Electronically Signed  By: Anner Crete M.D.   On: 04/18/2020 18:08   DG Abd Portable 1 View  Result Date: 04/18/2020 CLINICAL DATA:  MRI screening examination EXAM: PORTABLE ABDOMEN - 1 VIEW COMPARISON:  None. FINDINGS: The bowel gas pattern is normal. No radio-opaque calculi or other significant radiographic abnormality are seen. No intra-abdominal metallic foreign body identified. IMPRESSION: No intra-abdominal metallic foreign body. Electronically Signed   By: Fidela Salisbury MD   On: 04/18/2020 22:00    Labs:  CBC: Recent Labs    04/18/20 1718 04/19/20 0318 04/20/20 0312 04/21/20 0410  WBC 9.0 8.3 7.0 7.7  HGB 9.6* 9.3* 8.0* 8.2*  HCT 33.4* 32.2* 26.8* 28.0*  PLT 398 409* 336 345    COAGS: No results for input(s): INR, APTT in the last 8760 hours.  BMP: Recent Labs    02/16/20 2209 03/13/20 1328 04/19/20 0319  04/19/20 0913 04/19/20 1611 04/19/20 1748 04/20/20 0312 04/21/20 0410  NA 129*   < > 136  --   --  137 139 141  K 5.3*   < > 6.1*   < > 6.8* 5.2* 5.0 4.9  CL 96*   < > 105  --   --  108 110 110  CO2 22   < > 18*  --   --  18* 20* 23  GLUCOSE 119*   < > 127*  --   --  102* 93 113*  BUN 26*   < > 68*  --   --  61* 52* 40*  CALCIUM 9.3   < > 9.3  --   --  9.2 9.3 9.7  CREATININE 0.91   < > 1.83*  --   --  1.24 0.91 0.76  GFRNONAA >60   < > 40*  --   --  >60 >60 >60  GFRAA >60  --   --   --   --   --   --   --    < > = values in this interval not displayed.    LIVER FUNCTION TESTS: Recent Labs    02/16/20 2209 03/13/20 1328 04/18/20 1718  BILITOT 0.6 1.0 0.9  AST 31 32 46*  ALT 26 32 26  ALKPHOS 167* 244* 389*  PROT 7.7 7.4 7.5  ALBUMIN 3.6 3.3* 2.5*    TUMOR MARKERS: No results for input(s): AFPTM, CEA, CA199, CHROMGRNA in the last 8760 hours.  Assessment and Plan: 69 y.o. male with past medical history of anxiety, dementia, dyslipidemia, GERD, hypertension, schizophrenia, seizures, BPH, vitamin D deficiency diabetes, tardive dyskinesia and prior colon cancer with resection in [place] a few years ago.  He reportedly did not need chemotherapy or radiation therapy.  He was recently admitted to [facility] for evaluation of altered mental status.  Laboratory evaluation on admission showed elevated lactic acid, elevated BUN and creatinine, and elevated potassium.  CBC showed normocytic anemia, chest x-ray CT head MRI brain were normal.  COVID-19 negative.  Renal ultrasound demonstrated multiple liver lesions with subsequent CT C/A/P revealing large number of liver mets with findings of probable mild colonic ileus, mildly enlarged prostate gland and proximal coronary artery calcifications, signs of metastatic involvement of the left proximal humerus, right anterior fourth, fifth and sixth ribs with heterogeneous appearance of some vertebral bodies without discrete lesion.  Request now  received from oncology for image guided liver lesion biopsy for further evaluation.  Imaging studies reviewed by Dr. Annamaria Boots. Currently awaiting call back from pt's legal guardian [name] to  discuss details/risks of above procedure/obtain consent.    Thank you for this interesting consult.  I greatly enjoyed meeting COPELAN MAULTSBY and look forward to participating in their care.  A copy of this report was sent to the requesting provider on this date.  Electronically Signed: D. Rowe Robert, PA-C 04/21/2020, 9:46 AM   I spent a total of 25 minutes  in face to face in clinical consultation, greater than 50% of which was counseling/coordinating care for image guided liver lesion biopsy

## 2020-04-21 NOTE — TOC Initial Note (Signed)
Transition of Care [facility]) - Initial/Assessment Note    Patient Details  Name: Scott [name] MRN: [id number] Date of Birth: Feb 05, 1951  Transition of [facility]) CM/SW Contact:    Geralynn Ochs, LCSW Phone Number: 04/21/2020, 2:54 PM  Clinical Narrative:       CSW spoke with patient's guardian, [name], to confirm plan to return to [place] at discharge. CSW confirmed with [place] that patient is a LTC resident and has a bed available whenever medically stable. CSW to follow.            Expected Discharge Plan: Skilled Nursing Facility Barriers to Discharge: Continued Medical Work up   Patient Goals and CMS Choice Patient states their goals for this hospitalization and ongoing recovery are:: patient unable to participate in goal setting due to disorientation CMS Medicare.gov Compare Post Acute Care list provided to:: Legal Guardian Choice offered to / list presented to : [place] / Guardian  Expected Discharge Plan and Services Expected Discharge Plan: [place] Choice: [place] Living arrangements for the past 2 months: [place]                                      Prior Living Arrangements/Services Living arrangements for the past 2 months: [place] Lives with:: Facility Resident Patient language and need for interpreter reviewed:: No Do you feel safe going back to the place where you live?: Yes      Need for Family Participation in Patient Care: Yes (Comment) Care giver support system in place?: No (comment)   Criminal Activity/Legal Involvement Pertinent to Current Situation/Hospitalization: No - Comment as needed  Activities of Daily Living      Permission Sought/Granted Permission sought to share information with : Facility Sport and exercise psychologist, Family Supports Permission granted to share information with : Yes, Verbal Permission Granted  Share  Information with NAME: [name]  Permission granted to share info w AGENCY: SNF  Permission granted to share info w Relationship: Legal guardian     Emotional Assessment   Attitude/Demeanor/Rapport: Unable to Assess Affect (typically observed): Unable to Assess Orientation: : Oriented to Self Alcohol / Substance Use: Not Applicable Psych Involvement: No (comment)  Admission diagnosis:  Metabolic encephalopathy [L27.51] Dehydration [E86.0] Hyperkalemia [E87.5] Somnolence [R40.0] Confusion [R41.0] AKI (acute kidney injury) ([place]) [N17.9] Anemia due to vitamin B12 deficiency, unspecified B12 deficiency type [D51.9] Patient Active Problem List   Diagnosis Date Noted  . Metabolic encephalopathy 70/06/7492  . AKI (acute kidney injury) ([place]) 04/19/2020  . Hyperkalemia 04/19/2020  . Dementia ([place])   . Generalized weakness 10/14/2017  . Diabetes ([place]) 02/07/2016  . Schizophrenia, undifferentiated ([place]) 01/24/2016  . GERD (gastroesophageal reflux disease) 01/24/2016  . Tardive dyskinesia 01/24/2016  . Chronic hyponatremia 01/24/2016  . Hypertension 01/24/2016   PCP:  Lauretta Grill, NP Pharmacy:   [facility] [place] to [address]istered [place] [address] Phone: [phone] Fax: [phone]     Social Determinants of Health (SDOH) Interventions    Readmission Risk Interventions No flowsheet data found.

## 2020-04-21 NOTE — Progress Notes (Signed)
Patient ID: Scott [name], male   DOB: 11/22/1950, 69 y.o.   MRN: [id number] Consent obtained for image guided liver lesion biopsy on pt today from Dorene Ar as well as Jaynee Eagles , supervisor [facility] Services(legal guardians for [name]).Risks and benefits of procedure was discussed with the above individuals including, but not limited to bleeding, infection, damage to adjacent structures or low yield requiring additional tests.  All of the questions were answered and there is agreement to proceed.  Consent signed and in chart.

## 2020-04-22 DIAGNOSIS — G9341 Metabolic encephalopathy: Secondary | ICD-10-CM | POA: Diagnosis not present

## 2020-04-22 LAB — BASIC METABOLIC PANEL
Anion gap: 13 (ref 5–15)
BUN: 33 mg/dL — ABNORMAL HIGH (ref 8–23)
CO2: 18 mmol/L — ABNORMAL LOW (ref 22–32)
Calcium: 9.5 mg/dL (ref 8.9–10.3)
Chloride: 111 mmol/L (ref 98–111)
Creatinine, Ser: 0.72 mg/dL (ref 0.61–1.24)
GFR, Estimated: >60 mL/min (ref >60)
Glucose, Bld: 109 mg/dL — ABNORMAL HIGH (ref 70–99)
Potassium: 5.1 mmol/L (ref 3.5–5.1)
Sodium: 142 mmol/L (ref 135–145)

## 2020-04-22 LAB — CBC WITH DIFFERENTIAL/PLATELET
Abs Immature Granulocytes: 0.46 10*3/uL — ABNORMAL HIGH (ref 0.00–0.07)
Basophils Absolute: 0.1 10*3/uL (ref 0.0–0.1)
Basophils Relative: 1 %
Eosinophils Absolute: 0.4 10*3/uL (ref 0.0–0.5)
Eosinophils Relative: 4 %
HCT: 28.7 % — ABNORMAL LOW (ref 39.0–52.0)
Hemoglobin: 8.5 g/dL — ABNORMAL LOW (ref 13.0–17.0)
Immature Granulocytes: 5 %
Lymphocytes Relative: 21 %
Lymphs Abs: 1.9 10*3/uL (ref 0.7–4.0)
MCH: 25.9 pg — ABNORMAL LOW (ref 26.0–34.0)
MCHC: 29.6 g/dL — ABNORMAL LOW (ref 30.0–36.0)
MCV: 87.5 fL (ref 80.0–100.0)
Monocytes Absolute: 0.8 10*3/uL (ref 0.1–1.0)
Monocytes Relative: 9 %
Neutro Abs: 5.4 10*3/uL (ref 1.7–7.7)
Neutrophils Relative %: 60 %
Platelets: 371 10*3/uL (ref 150–400)
RBC: 3.28 MIL/uL — ABNORMAL LOW (ref 4.22–5.81)
RDW: 19 % — ABNORMAL HIGH (ref 11.5–15.5)
WBC: 9 10*3/uL (ref 4.0–10.5)
nRBC: 1.1 % — ABNORMAL HIGH (ref 0.0–0.2)

## 2020-04-22 LAB — RESPIRATORY PANEL BY RT PCR (FLU A&B, COVID)
Influenza A by PCR: NEGATIVE
Influenza B by PCR: NEGATIVE
SARS Coronavirus 2 by RT PCR: NEGATIVE

## 2020-04-22 LAB — GLUCOSE, CAPILLARY
Glucose-Capillary: 93 mg/dL (ref 70–99)
Glucose-Capillary: 102 mg/dL — ABNORMAL HIGH (ref 70–99)
Glucose-Capillary: 171 mg/dL — ABNORMAL HIGH (ref 70–99)

## 2020-04-22 NOTE — NC FL2 (Signed)
[place] LEVEL OF CARE SCREENING TOOL     IDENTIFICATION  Patient Name: Scott [name] Birthdate: [birth year] Sex: male Admission Date (Current Location): 04/18/2020  [facility] and [place] Number:  Herbalist and Address:  [facility], [place] [address] [place], [place] [address]      Provider Number: [id number]  Attending Physician Name and Address:  Terrilee Croak, MD  Relative Name and Phone Number:       Current Level of Care: Hospital Recommended Level of Care: [facility] Prior Approval Number:    Date Approved/Denied:   PASRR Number:    Discharge Plan: SNF    Current Diagnoses: Patient Active Problem List   Diagnosis Date Noted  . Metabolic encephalopathy 81/19/1478  . AKI (acute kidney injury) ([place]) 04/19/2020  . Hyperkalemia 04/19/2020  . Dementia ([place])   . Generalized weakness 10/14/2017  . Diabetes ([place]) 02/07/2016  . 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 GERD (gastroesophageal reflux disease) 01/24/2016  . Tardive dyskinesia 01/24/2016  . Chronic hyponatremia 01/24/2016  . Hypertension 01/24/2016    Orientation RESPIRATION BLADDER Height & Weight     Self  Normal Incontinent Weight: 198 lb 6.6 oz (90 kg) Height:  6\' 4"  (193 cm)  BEHAVIORAL SYMPTOMS/MOOD NEUROLOGICAL BOWEL NUTRITION STATUS      Incontinent Diet  AMBULATORY STATUS COMMUNICATION OF NEEDS Skin   Extensive Assist Verbally Surgical wounds (right abdomen, adhesive bandage, change PRN)                       Personal Care Assistance Level of Assistance  Bathing, Feeding, Dressing Bathing Assistance: Maximum assistance Feeding assistance: Limited assistance Dressing Assistance: Maximum assistance     Functional Limitations Info  Speech     Speech Info: Impaired (dysarthria)    SPECIAL CARE FACTORS FREQUENCY                       Contractures Contractures Info: Not present    Additional  Factors Info  Code Status, Allergies Code Status Info: Full Allergies Info: NKA           Current Medications (04/22/2020):  This is the current hospital active medication list Current Facility-Administered Medications  Medication Dose Route Frequency Provider Last Rate Last Admin  . acetaminophen (TYLENOL) tablet 650 mg  650 mg Oral Q6H PRN Opyd, Ilene Qua, MD       Or  . acetaminophen (TYLENOL) suppository 650 mg  650 mg Rectal Q6H PRN Opyd, Ilene Qua, MD      . Chlorhexidine Gluconate Cloth 2 % PADS 6 each  6 each Topical [id number] George Hugh, MD   6 each at 04/22/20 0529  . enoxaparin (LOVENOX) injection 40 mg  40 mg Subcutaneous Q24H Allred, Darrell K, PA-C   40 mg at 04/22/20 0529  . haloperidol lactate (HALDOL) injection 2 mg  2 mg Intravenous Q6H PRN Opyd, Ilene Qua, MD      . insulin aspart (novoLOG) injection 0-5 Units  0-5 Units Subcutaneous QHS Vianne Bulls, MD   2 Units at 04/21/20 2219  . insulin aspart (novoLOG) injection 0-9 Units  0-9 Units Subcutaneous TID WC Opyd, Ilene Qua, MD   1 Units at 04/20/20 1727  . mupirocin ointment (BACTROBAN) 2 % 1 application  1 application Nasal BID George Hugh, MD   1 application at 13/08/65 2224  . ondansetron (ZOFRAN) tablet 4 mg  4 mg Oral Q6H  PRN Opyd, Ilene Qua, MD       Or  . ondansetron (ZOFRAN) injection 4 mg  4 mg Intravenous Q6H PRN Opyd, Ilene Qua, MD      . polyethylene glycol (MIRALAX / GLYCOLAX) packet 17 g  17 g Oral Daily George Hugh, MD   17 g at 04/20/20 1301  . senna (SENOKOT) tablet 8.6 mg  1 tablet Oral Daily George Hugh, MD   8.6 mg at 04/20/20 1301  . senna-docusate (Senokot-S) tablet 1 tablet  1 tablet Oral QHS PRN Opyd, Ilene Qua, MD      . sodium zirconium cyclosilicate (LOKELMA) packet 10 g  10 g Oral Once Opyd, Ilene Qua, MD         Discharge Medications: Please see discharge summary for a list of discharge medications.  Relevant Imaging Results:  Relevant Lab Results:   Additional  Information SS#: [id number]  Geralynn Ochs, LCSW

## 2020-04-22 NOTE — Discharge Summary (Addendum)
Physician Discharge Summary  Scott [name] XNA:[id number] DOB: [birth year] DOA: 04/18/2020  PCP: Lauretta Grill, NP  Admit date: 04/18/2020 Discharge date: 04/22/2020  Admitted From: SNF Discharge disposition: SNF   Code Status: Full Code  Diet Recommendation: Diabetic diet  Discharge Diagnosis:   Principal Problem:   Metabolic encephalopathy Active Problems:   Schizophrenia, undifferentiated ([place])   Diabetes ([place])   AKI (acute kidney injury) ([place])   Hyperkalemia   Dementia ([place])   History of Present Illness / Brief narrative:  69 year old male with PMH significant for dementia, schizophrenia, insulin-dependent diabetes mellitus, hypertension, tardive dyskinesia, history of colon cancer status post resection in [place] few years ago. 11/15, patient was sent to the ED from SNF for evaluation of altered mental status.    On admission, patient was afebrile, maintaining oxygen saturation, hemodynamically stable. However blood work showed elevated lactic acid level of 3, BUN/creatinine elevated to 66/1.65, up from 0.9 a month ago, potassium elevated to 6.  CBC with normocytic anemia Chest x-ray normal.  CT head normal.  MRI brain normal. Blood culture and urine culture were obtained.  Patient was given IV hydration and admitted to hospitalist service.  Subjective:  Seen and examined this morning. Elderly African-American male.  Not in distress.  Baseline dementia.  Tends to take his tongue out all the time probably part of tardive dyskinesia. Hemoglobin stable.  Hospital Course:  Acute Toxic Metabolic Encephalopathy -In a background of dementia, schizophrenia. -Likely secondary to dehydration. -No evidence of infection.  CT scan of head and MRI of brain normal. -TSH, B12, Folate, RPR ok. -Adequately hydrated.  Encourage oral appetite and hydration. -Monitormental status for improvement.  Metastatic liver masses History of colon cancer -According to patient's  legal guardian [name], patient has a history of colon cancer status post resection few years ago in [place], [place].  He did not need chemo or radiation.   -In this admission, patient had a CT scan of abdomen on 11/16 which showed a large number of liver metastasis without primary source identified.   -11/17 discussed with oncologist on call Dr. Lorenso Courier.  Recommended a CT chest with contrast for staging.  Also recommended a biopsy of one of the liver masses by IR.  -CT chest showed metastasis to left proximal humerus, right anterior fourth, fifth, sixth ribs.  No metastases to the lungs.   -IR did biopsy of liver mass.  Biopsy report pending.  I have informed Dr. Lorenso Courier, oncologist as well as patient's legal guardian about the need of outpatient follow-up plan.  Acute Kidney Injury Lactic acidosis -On admission, creatinine was elevated 1.65, lactic acid elevated to 3.2, no evidence of infection. -Blood changes likely secondary to dehydration. -Both have normalized now. -Renal ultrasound did not show any evidence of obstruction. Recent Labs    03/13/20 1328 03/16/20 1543 03/27/20 1834 03/27/20 2237 04/18/20 1718 04/19/20 0319 04/19/20 1748 04/20/20 0312 04/21/20 0410 04/22/20 0802  BUN 22 15 44* 39* 66* 68* 61* 52* 40* 33*  CREATININE 1.09 0.90 1.14 0.92 1.65* 1.83* 1.24 0.91 0.76 0.72   Recent Labs  Lab 04/18/20 1718 04/18/20 2001 04/19/20 0319 04/19/20 0913  LATICACIDVEN 3.0* 3.2* 2.3* 1.5   Hyperkalemia:  -Improved with calcium, insulin and dextrose. Recent Labs  Lab 04/19/20 1611 04/19/20 1748 04/20/20 0312 04/21/20 0410 04/22/20 0802  K 6.8* 5.2* 5.0 4.9 5.1   Acute on chronic anemia -Baseline hemoglobin seems to be between 9-10.   -No evidence of active bleeding.  -Currently  stable between 8-9. Recent Labs    04/18/20 1718 04/18/20 1718 04/19/20 0318 04/19/20 0318 04/20/20 0312 04/20/20 0312 04/21/20 0410 04/22/20 0802  HGB 9.6*  --  9.3*  --   8.0*  --  8.2* 8.5*  MCV 88.6   < > 87.7   < > 87.0   < > 87.0 87.5  VITAMINB12  --   --  6,999*  --   --   --   --   --   FOLATE  --   --  17.4  --   --   --   --   --    < > = values in this interval not displayed.   Mild Colonic Ileus  -CT abdomen noted distended colon with prominent stool and gas with no obstructing mass likely reflecting mild colonic ileus. Most likely due to chronic antipsychotic use. -He had successful bowel movement with enema. -Continue bowel regimen Miralax and Senokot.   Diabetes Mellitus Type 2 -A1c 7.6 on 10/10. -Resume Metformin post discharge. Recent Labs  Lab 04/21/20 0650 04/21/20 1700 04/21/20 2218 04/22/20 0630 04/22/20 0802  GLUCAP 118* 117* 238* 93 102*   BPH - Foley in place. ?  New versus old Foley - Flomax on discharge.  Dementia/schizophrenia Baseline cognitive impairment -Continue oral neuropsych meds as before.  Atherosclerosis -On aspirin and Statin.  Okay to discharge back to SNF today.  I have called and updated patient's legal guardian [name] about the liver biopsy and the need to follow-up with oncology as an outpatient.  Wound care: Incision (Closed) 04/21/20 Abdomen Right (Active)  Date First Assessed/Time First Assessed: 04/21/20 1236   Location: Abdomen  Location Orientation: Right  Present on Admission: No    Assessments 04/21/2020 12:36 PM 04/22/2020  7:33 AM  Dressing Type Adhesive bandage Adhesive bandage  Dressing Clean;Dry;Intact Clean;Dry;Intact  Dressing Change Frequency PRN PRN  Site / Wound Assessment Clean;Dry Clean;Dry  Closure None None  Drainage Amount None None     No Linked orders to display    Discharge Exam:   Vitals:   04/21/20 1931 04/21/20 2347 04/22/20 0420 04/22/20 0803  BP: 120/78 112/77 117/77 104/67  Pulse: 87 93 89 93  Resp: 18 17 18 20   Temp: 98.1 F (36.7 C) 97.8 F (36.6 C) 97.7 F (36.5 C) 97.6 F (36.4 C)  TempSrc: Axillary Oral Oral Oral  SpO2: 100% 100% 94% 99%    Weight:      Height:        Body mass index is 24.15 kg/m.  General exam: Not in physical distress Skin: No rashes, lesions or ulcers. HEENT: Atraumatic, normocephalic, no obvious bleeding Lungs: Clear to auscultation bilaterally CVS: Regular rate and rhythm, no murmur GI/Abd soft, nontender, nondistended, bowel sound present CNS: Alert, awake, baseline dementia and cognitive issues Psychiatry: Mood appropriate Extremities: No pedal edema, no calf tenderness  Follow ups:   Discharge Instructions    DIET DYS 2   Complete by: As directed    Fluid consistency: Thin   Increase activity slowly   Complete by: As directed    No wound care   Complete by: As directed       Contact information for follow-up providers    Lauretta Grill, NP Follow up.   Specialty: Nurse Practitioner Contact information: [address] [place] [address] [phone]        Orson Slick, MD Follow up.   Specialty: Hematology and Oncology Contact information: [address]  [place] [address] [phone]            Contact information for after-discharge care    Destination    [facility] .   Service: Skilled Nursing Contact information: [address] [place] [phone]                  Recommendations for Outpatient Follow-Up:   1. Follow-up with oncology as an outpatient 2. Follow-up with PCP as an outpatient  Discharge Instructions:  Follow with Primary MD Lauretta Grill, NP in 7 days   Get CBC/BMP checked in next visit within 1 week by PCP or SNF MD ( we routinely change or add medications that can affect your baseline labs and fluid status, therefore we recommend that you get the mentioned basic workup next visit with your PCP, your PCP may decide not to get them or add new tests based on their clinical decision)  On your next visit with your PCP, please Get Medicines reviewed and  adjusted.  Please request your PCP  to go over all Hospital Tests and Procedure/Radiological results at the follow up, please get all Hospital records sent to your Prim MD by signing hospital release before you go home.  Activity: As tolerated with Full fall precautions use walker/cane & assistance as needed  For Heart failure patients - Check your Weight same time everyday, if you gain over 2 pounds, or you develop in leg swelling, experience more shortness of breath or chest pain, call your Primary MD immediately. Follow Cardiac Low Salt Diet and 1.5 lit/day fluid restriction.  If you have smoked or chewed Tobacco in the last 2 yrs please stop smoking, stop any regular Alcohol  and or any Recreational drug use.  If you experience worsening of your admission symptoms, develop shortness of breath, life threatening emergency, suicidal or homicidal thoughts you must seek medical attention immediately by calling 911 or calling your MD immediately  if symptoms less severe.  You Must read complete instructions/literature along with all the possible adverse reactions/side effects for all the Medicines you take and that have been prescribed to you. Take any new Medicines after you have completely understood and accpet all the possible adverse reactions/side effects.   Do not drive, operate heavy machinery, perform activities at heights, swimming or participation in water activities or provide baby sitting services if your were admitted for syncope or siezures until you have seen by Primary MD or a Neurologist and advised to do so again.  Do not drive when taking Pain medications.  Do not take more than prescribed Pain, Sleep and Anxiety Medications  Wear Seat belts while driving.   Please note You were cared for by a hospitalist during your hospital stay. If you have any questions about your discharge medications or the care you received while you were in the hospital after you are discharged, you can  call the unit and asked to speak with the hospitalist on call if the hospitalist that took care of you is not available. Once you are discharged, your primary care physician will handle any further medical issues. Please note that NO REFILLS for any discharge medications will be authorized once you are discharged, as it is imperative that you return to your primary care physician (or establish a relationship with a primary care physician if you do not have one) for your aftercare needs so that they can reassess your need for medications and monitor your lab  values.    Allergies as of 04/22/2020   No Known Allergies     Medication List    TAKE these medications   atorvastatin 40 MG tablet Commonly known as: Lipitor Take 1 tablet (40 mg total) by mouth daily for 7 days. What changed: when to take this   benztropine 1 MG tablet Commonly known as: COGENTIN Take 1 tablet (1 mg total) by mouth 2 (two) times daily for 7 days.   clonazePAM 0.5 MG tablet Commonly known as: KlonoPIN Take 1 tablet (0.5 mg total) by mouth 2 (two) times daily for 7 days.   divalproex 250 MG 24 hr tablet Commonly known as: Depakote ER Take 3 tablets (750 mg total) by mouth daily. What changed: Another medication with the same name was removed. Continue taking this medication, and follow the directions you see here.   famotidine 20 MG tablet Commonly known as: PEPCID Take 1 tablet (20 mg total) by mouth daily for 7 days. What changed: when to take this   ferrous sulfate 325 (65 FE) MG tablet Take 1 tablet (325 mg total) by mouth 2 (two) times daily with a meal.   haloperidol 5 MG tablet Commonly known as: HALDOL Take 1 tablet (5 mg total) by mouth at bedtime.   haloperidol 2 MG tablet Commonly known as: HALDOL Take 1 tablet (2 mg total) by mouth daily.   insulin glargine 100 UNIT/ML injection Commonly known as: LANTUS Inject 18 Units into the skin at bedtime. What changed: Another medication with the  same name was changed. Make sure you understand how and when to take each.   insulin glargine 100 UNIT/ML injection Commonly known as: LANTUS 18 units QHS What changed:   how much to take  how to take this  when to take this  additional instructions   metFORMIN 1000 MG tablet Commonly known as: Glucophage Take 1 tablet (1,000 mg total) by mouth 2 (two) times daily with a meal for 7 days.   OLANZapine 15 MG tablet Commonly known as: ZyPREXA Take 1 tablet (15 mg total) by mouth in the morning and at bedtime.   tamsulosin 0.4 MG Caps capsule Commonly known as: FLOMAX Take 1 capsule (0.4 mg total) by mouth daily. What changed: when to take this   traZODone 150 MG tablet Commonly known as: DESYREL Take 1 tablet (150 mg total) by mouth at bedtime.       Time coordinating discharge: 35 minutes  The results of significant diagnostics from this hospitalization (including imaging, microbiology, ancillary and laboratory) are listed below for reference.    Procedures and Diagnostic Studies:   CT Head Wo Contrast  Result Date: 04/18/2020 CLINICAL DATA:  69 year old male with altered mental status. EXAM: CT HEAD WITHOUT CONTRAST TECHNIQUE: Contiguous axial images were obtained from the base of the skull through the vertex without intravenous contrast. COMPARISON:  Head CT dated 10/14/2017. FINDINGS: Evaluation of this exam is limited due to motion artifact. Brain: Mild age-related atrophy and chronic microvascular ischemic changes. There is no acute intracranial hemorrhage. No mass effect midline shift no extra-axial fluid collection. There is an apparent empty sella. Vascular: No hyperdense vessel or unexpected calcification. Skull: Normal. Negative for fracture or focal lesion. Sinuses/Orbits: No acute finding. Other: None IMPRESSION: 1. No acute intracranial pathology. 2. Mild age-related atrophy and chronic microvascular ischemic changes. Electronically Signed   By: Anner Crete  M.D.   On: 04/18/2020 18:11   MR BRAIN WO CONTRAST  Result Date: 04/19/2020 CLINICAL DATA:  Encephalopathy with right facial droop EXAM: MRI HEAD WITHOUT CONTRAST TECHNIQUE: Multiplanar, multiecho pulse sequences of the brain and surrounding structures were obtained without intravenous contrast. COMPARISON:  None. FINDINGS: An abbreviated protocol was utilized consisting of coronal and axial diffusion-weighted imaging only. There is no acute ischemia. No midline shift or other mass effect. IMPRESSION: No acute ischemia. Electronically Signed   By: Ulyses Jarred M.D.   On: 04/19/2020 01:05   CT ABDOMEN PELVIS W CONTRAST  Result Date: 04/19/2020 CLINICAL DATA:  Multiple liver metastases on a recent renal ultrasound. Unknown primary. Smoker. EXAM: CT ABDOMEN AND PELVIS WITH CONTRAST TECHNIQUE: Multidetector CT imaging of the abdomen and pelvis was performed using the standard protocol following bolus administration of intravenous contrast. CONTRAST:  83mL OMNIPAQUE IOHEXOL 300 MG/ML  SOLN COMPARISON:  Renal ultrasound obtained earlier today. FINDINGS: Lower chest: Clear lung bases. Normal sized heart. Proximal coronary artery calcifications. Hepatobiliary: Large number of low density masses of varying sizes. The largest is a confluence of masses in the left lobe, measuring 13.4 x 10.6 cm on image number 37 series 3. Possible sludge and thin internal septation or Phrygian cap in the gallbladder with no calcified gallstones visualized. No gallbladder wall thickening or pericholecystic fluid. Pancreas: Unremarkable. No pancreatic ductal dilatation or surrounding inflammatory changes. Spleen: Normal in size without focal abnormality. Adrenals/Urinary Tract: Normal appearing adrenal glands. Small right renal cysts. Small left renal cyst, poorly defined due to poor contrast opacification of the kidneys. Unremarkable urinary bladder and ureters. Stomach/Bowel: Distended colon with prominent stool and gas with no  obstructing mass visualized. The sigmoid colon is redundant. Right lower quadrant small bowel/colon anastomosis. The appendix is not visualized and is most likely surgically absent following bowel resection. Vascular/Lymphatic: Mild atheromatous arterial calcifications. No enlarged lymph nodes. Reproductive: Moderately enlarged prostate gland. Other: Midline surgical scar. Musculoskeletal: Left tissue will bone island. Lumbar and lower thoracic spine degenerative changes. L5 vertebral hemangiomas and L4 Schmorl's node. No evidence of bony metastatic disease. IMPRESSION: 1. Large number of liver metastases. 2. No primary source of malignancy identified in the abdomen or pelvis. 3. Distended colon with prominent stool and gas with no obstructing mass visualized. This most likely reflects mild colonic ileus. 4. Moderately enlarged prostate gland. 5. Proximal coronary artery atheromatous calcifications. Electronically Signed   By: Claudie Revering M.D.   On: 04/19/2020 13:06   US RENAL  Result Date: 04/19/2020 CLINICAL DATA:  Acute renal insufficiency EXAM: RENAL / URINARY TRACT ULTRASOUND COMPLETE COMPARISON:  None. FINDINGS: Right Kidney: Renal measurements: 11.3 x 5.3 x 6.0 cm = volume: 188 mL. The lower pole of the right kidney is obscured by overlying bowel gas. Echogenicity within normal limits. No mass or hydronephrosis visualized. Left Kidney: Obscured by overlying bowel gas Bladder: Appears normal for degree of bladder distention. Other: Multiple ill-defined masses are seen scattered throughout both hepatic lobes, not fully characterized on this examination. IMPRESSION: Multiple hepatic masses, not well characterized on this examination. Dedicated CT examination of the abdomen and pelvis with contrast is recommended for further evaluation. Limited examination without visualization of the left kidney. Normal appearance of the visualized right kidney. Electronically Signed   By: Fidela Salisbury MD   On:  04/19/2020 03:46   DG Pelvis Portable  Result Date: 04/18/2020 CLINICAL DATA:  MRI screening examination EXAM: PORTABLE PELVIS 1-2 VIEWS COMPARISON:  None. FINDINGS: There is no evidence of pelvic fracture or diastasis. No pelvic bone lesions are seen. No intrapelvic metallic foreign body identified. IMPRESSION: No intrapelvic  metallic foreign body. Electronically Signed   By: Fidela Salisbury MD   On: 04/18/2020 22:00   DG Chest Portable 1 View  Result Date: 04/18/2020 CLINICAL DATA:  69 year old male with altered mental status. EXAM: PORTABLE CHEST 1 VIEW COMPARISON:  Chest radiograph dated 03/15/2020 FINDINGS: Left lung base linear and platelike atelectasis. No focal consolidation, pleural effusion, pneumothorax. The cardiac silhouette is within limits. No acute osseous pathology. IMPRESSION: No active disease. Electronically Signed   By: Anner Crete M.D.   On: 04/18/2020 18:08   DG Abd Portable 1 View  Result Date: 04/18/2020 CLINICAL DATA:  MRI screening examination EXAM: PORTABLE ABDOMEN - 1 VIEW COMPARISON:  None. FINDINGS: The bowel gas pattern is normal. No radio-opaque calculi or other significant radiographic abnormality are seen. No intra-abdominal metallic foreign body identified. IMPRESSION: No intra-abdominal metallic foreign body. Electronically Signed   By: Fidela Salisbury MD   On: 04/18/2020 22:00     Labs:   Basic Metabolic Panel: Recent Labs  Lab 04/19/20 0319 04/19/20 0913 04/19/20 1748 04/19/20 1748 04/20/20 5400 04/20/20 0312 04/21/20 0410 04/22/20 0802  NA 136  --  137  --  139  --  141 142  K 6.1*   < > 5.2*   < > 5.0   < > 4.9 5.1  CL 105  --  108  --  110  --  110 111  CO2 18*  --  18*  --  20*  --  23 18*  GLUCOSE 127*  --  102*  --  93  --  113* 109*  BUN 68*  --  61*  --  52*  --  40* 33*  CREATININE 1.83*  --  1.24  --  0.91  --  0.76 0.72  CALCIUM 9.3  --  9.2  --  9.3  --  9.7 9.5   < > = values in this interval not displayed.    GFR Estimated Creatinine Clearance: 108.5 mL/min (by C-G formula based on SCr of 0.72 mg/dL). Liver Function Tests: Recent Labs  Lab 04/18/20 1718  AST 46*  ALT 26  ALKPHOS 389*  BILITOT 0.9  PROT 7.5  ALBUMIN 2.5*   Recent Labs  Lab 04/18/20 1718  LIPASE 25   Recent Labs  Lab 04/18/20 1724  AMMONIA 31   Coagulation profile Recent Labs  Lab 04/21/20 1006  INR 1.5*    CBC: Recent Labs  Lab 04/18/20 1718 04/19/20 0318 04/20/20 0312 04/21/20 0410 04/22/20 0802  WBC 9.0 8.3 7.0 7.7 9.0  NEUTROABS 7.2  --   --   --  5.4  HGB 9.6* 9.3* 8.0* 8.2* 8.5*  HCT 33.4* 32.2* 26.8* 28.0* 28.7*  MCV 88.6 87.7 87.0 87.0 87.5  PLT 398 409* 336 345 371   Cardiac Enzymes: No results for input(s): CKTOTAL, CKMB, CKMBINDEX, TROPONINI in the last 168 hours. BNP: Invalid input(s): POCBNP CBG: Recent Labs  Lab 04/21/20 0650 04/21/20 1700 04/21/20 2218 04/22/20 0630 04/22/20 0802  GLUCAP 118* 117* 238* 93 102*   D-Dimer No results for input(s): DDIMER in the last 72 hours. Hgb A1c No results for input(s): HGBA1C in the last 72 hours. Lipid Profile No results for input(s): CHOL, HDL, LDLCALC, TRIG, CHOLHDL, LDLDIRECT in the last 72 hours. Thyroid function studies No results for input(s): TSH, T4TOTAL, T3FREE, THYROIDAB in the last 72 hours.  Invalid input(s): FREET3 Anemia work up No results for input(s): VITAMINB12, FOLATE, FERRITIN, TIBC, IRON, RETICCTPCT in the last 72 hours.  Microbiology Recent Results (from the past 240 hour(s))  Blood culture (routine x 2)     Status: None (Preliminary result)   Collection Time: 04/18/20  5:30 PM   Specimen: BLOOD RIGHT WRIST  Result Value Ref Range Status   Specimen Description BLOOD RIGHT WRIST  Final   Special Requests   Final    BOTTLES DRAWN AEROBIC AND ANAEROBIC Blood Culture results may not be optimal due to an inadequate volume of blood received in culture bottles   Culture   Final    NO GROWTH 3 DAYS Performed  at [facility], [place] [address] [place], [place] [address]    Report Status PENDING  Incomplete  Blood culture (routine x 2)     Status: None (Preliminary result)   Collection Time: 04/18/20  6:15 PM   Specimen: BLOOD LEFT WRIST  Result Value Ref Range Status   Specimen Description BLOOD LEFT WRIST  Final   Special Requests   Final    BOTTLES DRAWN AEROBIC AND ANAEROBIC Blood Culture results may not be optimal due to an inadequate volume of blood received in culture bottles   Culture   Final    NO GROWTH 3 DAYS Performed at [facility], [place] [address], [place], [place] [address]    Report Status PENDING  Incomplete  Respiratory Panel by RT PCR (Flu A&B, Covid) - Urine, Random     Status: None   Collection Time: 04/18/20  6:15 PM   Specimen: Urine, Random; Nasopharyngeal  Result Value Ref Range Status   SARS Coronavirus 2 by RT PCR NEGATIVE NEGATIVE Final    Comment: (NOTE) SARS-CoV-2 target nucleic acids are NOT DETECTED.  The SARS-CoV-2 RNA is generally detectable in upper respiratoy specimens during the acute phase of infection. The lowest concentration of SARS-CoV-2 viral copies this assay can detect is 131 copies/mL. A negative result does not preclude SARS-Cov-2 infection and should not be used as the sole basis for treatment or other patient management decisions. A negative result may occur with  improper specimen collection/handling, submission of specimen other than nasopharyngeal swab, presence of viral mutation(s) within the areas targeted by this assay, and inadequate number of viral copies (<131 copies/mL). A negative result must be combined with clinical observations, patient history, and epidemiological information. The expected result is Negative.  Fact Sheet for Patients:  PinkCheek.be  Fact Sheet for Healthcare Providers:  GravelBags.it  This test is no t yet approved or cleared by the  [place] FDA and  has been authorized for detection and/or diagnosis of SARS-CoV-2 by FDA under an Emergency Use Authorization (EUA). This EUA will remain  in effect (meaning this test can be used) for the duration of the COVID-19 declaration under Section 564(b)(1) of the Act, 21 U.S.C. section 360bbb-3(b)(1), unless the authorization is terminated or revoked sooner.     Influenza A by PCR NEGATIVE NEGATIVE Final   Influenza B by PCR NEGATIVE NEGATIVE Final    Comment: (NOTE) The Xpert Xpress SARS-CoV-2/FLU/RSV assay is intended as an aid in  the diagnosis of influenza from Nasopharyngeal swab specimens and  should not be used as a sole basis for treatment. Nasal washings and  aspirates are unacceptable for Xpert Xpress SARS-CoV-2/FLU/RSV  testing.  Fact Sheet for Patients: PinkCheek.be  Fact Sheet for Healthcare Providers: GravelBags.it  This test is not yet approved or cleared by the [place] FDA and  has been authorized for detection and/or diagnosis of SARS-CoV-2 by  FDA under  an Emergency Use Authorization (EUA). This EUA will remain  in effect (meaning this test can be used) for the duration of the  Covid-19 declaration under Section 564(b)(1) of the Act, 21  U.S.C. section 360bbb-3(b)(1), unless the authorization is  terminated or revoked. Performed at [facility], [place] [address], [place], [place] [address]   Urine culture     Status: Abnormal   Collection Time: 04/18/20  6:20 PM   Specimen: Urine, Random  Result Value Ref Range Status   Specimen Description URINE, RANDOM  Final   Special Requests   Final    NONE Performed at [facility], [place] [address] [place], [place] [address]    Culture MULTIPLE SPECIES PRESENT, SUGGEST RECOLLECTION (A)  Final   Report Status 04/19/2020 FINAL  Final  MRSA PCR Screening     Status: Abnormal   Collection Time: 04/19/20  9:11 PM   Specimen: Nasal  Mucosa; Nasopharyngeal  Result Value Ref Range Status   MRSA by PCR POSITIVE (A) NEGATIVE Final    Comment:        The GeneXpert MRSA Assay (FDA approved for NASAL specimens only), is one component of a comprehensive MRSA colonization surveillance program. It is not intended to diagnose MRSA infection nor to guide or monitor treatment for MRSA infections. RESULT CALLED TO, READ BACK BY AND VERIFIED WITH: [name] Boos RN 04/20/20 0024 JDW Performed at [facility], [address] [place],  [address]      Signed: Terrilee Croak  [facility] 04/22/2020, 11:40 AM

## 2020-04-22 NOTE — Care Management Important Message (Signed)
Important Message  Patient Details  Name: Scott [name] MRN: [id number] Date of Birth: [birth year]   Medicare Important Message Given:  Yes     Orbie Pyo 04/22/2020, 3:23 PM

## 2020-04-22 NOTE — Progress Notes (Signed)
Report called to Morene Antu at [facility]. Patient ready for transport. Belongings at bedside, IV removed.

## 2020-04-22 NOTE — TOC Transition Note (Signed)
Transition of Care [facility]) - CM/SW Discharge Note   Patient Details  Name: Scott [name] MRN: [id number] Date of Birth: [birth year]  Transition of Care [facility]) CM/SW Contact:  Geralynn Ochs, LCSW Phone Number: 04/22/2020, 2:45 PM   Clinical Narrative:   Nurse to call report to [phone]    Final next level of care: [place] Barriers to Discharge: Barriers Resolved   Patient Goals and CMS Choice Patient states their goals for this hospitalization and ongoing recovery are:: patient unable to participate in goal setting due to disorientation CMS Medicare.gov Compare Post Acute Care list provided to:: Legal Guardian Choice offered to / list presented to : [place] / [place]  Discharge Placement              Patient chooses bed at:  [facility]) Patient to be transferred to facility by: [place] Name of family member notified: Butch Penny Patient and family notified of of transfer: 04/22/20  Discharge Plan and Services     Post Acute Care Choice: [place]                               Social Determinants of Health (SDOH) Interventions     Readmission Risk Interventions No flowsheet data found.

## 2020-04-23 LAB — CULTURE, BLOOD (ROUTINE X 2)
Culture: NO GROWTH
Culture: NO GROWTH

## 2020-04-25 LAB — SURGICAL PATHOLOGY

## 2020-04-26 NOTE — Progress Notes (Signed)
Patient's legal guardian Lonell Grandchild calls back.  I informed her of the results of the liver biopsy and scan results.  I have scheduled this patient to see Dr. Julieanne Manson on Monday 12/6 at 2 pm to arrive by 1:45 for registration.  She will coordinate the visit with [facility] and plans to come with him to this appointment.  She has my direct number should she need to call back.

## 2020-04-26 NOTE — Progress Notes (Signed)
Left voice message for patient's legal guardian Scott [name] referencing attempting to arrange an appointment with medical oncologist with regards to recent findings from hospitalization.  I have left my direct contact number for her to call me back.

## 2020-05-04 ENCOUNTER — Inpatient Hospital Stay (HOSPITAL_COMMUNITY)
Admission: EM | Admit: 2020-05-04 | Discharge: 2020-06-04 | DRG: 871 | Disposition: E | Payer: Medicare Other | Source: Skilled Nursing Facility | Attending: Critical Care Medicine | Admitting: Critical Care Medicine

## 2020-05-04 ENCOUNTER — Inpatient Hospital Stay (HOSPITAL_COMMUNITY): Payer: Medicare Other

## 2020-05-04 ENCOUNTER — Emergency Department (HOSPITAL_COMMUNITY): Payer: Medicare Other

## 2020-05-04 DIAGNOSIS — Z9842 Cataract extraction status, left eye: Secondary | ICD-10-CM | POA: Diagnosis not present

## 2020-05-04 DIAGNOSIS — A419 Sepsis, unspecified organism: Secondary | ICD-10-CM | POA: Diagnosis present

## 2020-05-04 DIAGNOSIS — K219 Gastro-esophageal reflux disease without esophagitis: Secondary | ICD-10-CM | POA: Diagnosis present

## 2020-05-04 DIAGNOSIS — F0390 Unspecified dementia without behavioral disturbance: Secondary | ICD-10-CM | POA: Diagnosis present

## 2020-05-04 DIAGNOSIS — J189 Pneumonia, unspecified organism: Secondary | ICD-10-CM | POA: Diagnosis present

## 2020-05-04 DIAGNOSIS — Z79899 Other long term (current) drug therapy: Secondary | ICD-10-CM

## 2020-05-04 DIAGNOSIS — F209 Schizophrenia, unspecified: Secondary | ICD-10-CM | POA: Diagnosis present

## 2020-05-04 DIAGNOSIS — E872 Acidosis: Secondary | ICD-10-CM | POA: Diagnosis present

## 2020-05-04 DIAGNOSIS — E11649 Type 2 diabetes mellitus with hypoglycemia without coma: Secondary | ICD-10-CM | POA: Diagnosis present

## 2020-05-04 DIAGNOSIS — C7951 Secondary malignant neoplasm of bone: Secondary | ICD-10-CM | POA: Diagnosis present

## 2020-05-04 DIAGNOSIS — J9601 Acute respiratory failure with hypoxia: Secondary | ICD-10-CM | POA: Diagnosis not present

## 2020-05-04 DIAGNOSIS — J9621 Acute and chronic respiratory failure with hypoxia: Secondary | ICD-10-CM | POA: Diagnosis present

## 2020-05-04 DIAGNOSIS — R627 Adult failure to thrive: Secondary | ICD-10-CM | POA: Diagnosis not present

## 2020-05-04 DIAGNOSIS — E785 Hyperlipidemia, unspecified: Secondary | ICD-10-CM | POA: Diagnosis present

## 2020-05-04 DIAGNOSIS — Z794 Long term (current) use of insulin: Secondary | ICD-10-CM

## 2020-05-04 DIAGNOSIS — I10 Essential (primary) hypertension: Secondary | ICD-10-CM | POA: Diagnosis present

## 2020-05-04 DIAGNOSIS — Z66 Do not resuscitate: Secondary | ICD-10-CM | POA: Diagnosis not present

## 2020-05-04 DIAGNOSIS — C787 Secondary malignant neoplasm of liver and intrahepatic bile duct: Secondary | ICD-10-CM | POA: Diagnosis present

## 2020-05-04 DIAGNOSIS — J440 Chronic obstructive pulmonary disease with acute lower respiratory infection: Secondary | ICD-10-CM | POA: Diagnosis present

## 2020-05-04 DIAGNOSIS — E870 Hyperosmolality and hypernatremia: Secondary | ICD-10-CM | POA: Diagnosis present

## 2020-05-04 DIAGNOSIS — Z6823 Body mass index (BMI) 23.0-23.9, adult: Secondary | ICD-10-CM

## 2020-05-04 DIAGNOSIS — C189 Malignant neoplasm of colon, unspecified: Secondary | ICD-10-CM | POA: Diagnosis present

## 2020-05-04 DIAGNOSIS — Z7984 Long term (current) use of oral hypoglycemic drugs: Secondary | ICD-10-CM | POA: Diagnosis not present

## 2020-05-04 DIAGNOSIS — R6521 Severe sepsis with septic shock: Secondary | ICD-10-CM | POA: Diagnosis present

## 2020-05-04 DIAGNOSIS — Z515 Encounter for palliative care: Secondary | ICD-10-CM | POA: Diagnosis not present

## 2020-05-04 DIAGNOSIS — F17210 Nicotine dependence, cigarettes, uncomplicated: Secondary | ICD-10-CM | POA: Diagnosis present

## 2020-05-04 DIAGNOSIS — E162 Hypoglycemia, unspecified: Secondary | ICD-10-CM

## 2020-05-04 DIAGNOSIS — E875 Hyperkalemia: Secondary | ICD-10-CM | POA: Diagnosis present

## 2020-05-04 DIAGNOSIS — D630 Anemia in neoplastic disease: Secondary | ICD-10-CM | POA: Diagnosis present

## 2020-05-04 DIAGNOSIS — N179 Acute kidney failure, unspecified: Secondary | ICD-10-CM | POA: Diagnosis present

## 2020-05-04 DIAGNOSIS — Z961 Presence of intraocular lens: Secondary | ICD-10-CM | POA: Diagnosis present

## 2020-05-04 DIAGNOSIS — L8990 Pressure ulcer of unspecified site, unspecified stage: Secondary | ICD-10-CM | POA: Insufficient documentation

## 2020-05-04 DIAGNOSIS — R5381 Other malaise: Secondary | ICD-10-CM | POA: Diagnosis present

## 2020-05-04 DIAGNOSIS — Z20822 Contact with and (suspected) exposure to covid-19: Secondary | ICD-10-CM | POA: Diagnosis present

## 2020-05-04 DIAGNOSIS — Z9981 Dependence on supplemental oxygen: Secondary | ICD-10-CM

## 2020-05-04 DIAGNOSIS — F419 Anxiety disorder, unspecified: Secondary | ICD-10-CM | POA: Diagnosis present

## 2020-05-04 DIAGNOSIS — G2401 Drug induced subacute dyskinesia: Secondary | ICD-10-CM | POA: Diagnosis present

## 2020-05-04 DIAGNOSIS — R64 Cachexia: Secondary | ICD-10-CM | POA: Diagnosis present

## 2020-05-04 DIAGNOSIS — E8809 Other disorders of plasma-protein metabolism, not elsewhere classified: Secondary | ICD-10-CM | POA: Diagnosis not present

## 2020-05-04 DIAGNOSIS — R0689 Other abnormalities of breathing: Secondary | ICD-10-CM | POA: Diagnosis not present

## 2020-05-04 DIAGNOSIS — G40909 Epilepsy, unspecified, not intractable, without status epilepticus: Secondary | ICD-10-CM | POA: Diagnosis present

## 2020-05-04 DIAGNOSIS — Z9911 Dependence on respirator [ventilator] status: Secondary | ICD-10-CM | POA: Diagnosis not present

## 2020-05-04 LAB — I-STAT ARTERIAL BLOOD GAS, ED
Acid-base deficit: 10 mmol/L — ABNORMAL HIGH (ref 0.0–2.0)
Acid-base deficit: 9 mmol/L — ABNORMAL HIGH (ref 0.0–2.0)
Bicarbonate: 13.1 mmol/L — ABNORMAL LOW (ref 20.0–28.0)
Bicarbonate: 15.5 mmol/L — ABNORMAL LOW (ref 20.0–28.0)
Calcium, Ion: 1.23 mmol/L (ref 1.15–1.40)
Calcium, Ion: 1.22 mmol/L (ref 1.15–1.40)
HCT: 20 % — ABNORMAL LOW (ref 39.0–52.0)
HCT: 22 % — ABNORMAL LOW (ref 39.0–52.0)
Hemoglobin: 6.8 g/dL — CL (ref 13.0–17.0)
Hemoglobin: 7.5 g/dL — ABNORMAL LOW (ref 13.0–17.0)
O2 Saturation: 85 %
O2 Saturation: 92 %
Potassium: 5.7 mmol/L — ABNORMAL HIGH (ref 3.5–5.1)
Potassium: 6.3 mmol/L (ref 3.5–5.1)
Sodium: 145 mmol/L (ref 135–145)
Sodium: 150 mmol/L — ABNORMAL HIGH (ref 135–145)
TCO2: 14 mmol/L — ABNORMAL LOW (ref 22–32)
TCO2: 16 mmol/L — ABNORMAL LOW (ref 22–32)
pCO2 arterial: 19.7 mmHg — CL (ref 32.0–48.0)
pCO2 arterial: 28.8 mmHg — ABNORMAL LOW (ref 32.0–48.0)
pH, Arterial: 7.433 (ref 7.350–7.450)
pH, Arterial: 7.339 — ABNORMAL LOW (ref 7.350–7.450)
pO2, Arterial: 46 mmHg — ABNORMAL LOW (ref 83.0–108.0)
pO2, Arterial: 66 mmHg — ABNORMAL LOW (ref 83.0–108.0)

## 2020-05-04 LAB — ECHOCARDIOGRAM COMPLETE
Area-P 1/2: 2.83 cm2
S' Lateral: 3.15 cm

## 2020-05-04 LAB — COMPREHENSIVE METABOLIC PANEL
ALT: 57 U/L — ABNORMAL HIGH (ref 0–44)
AST: 127 U/L — ABNORMAL HIGH (ref 15–41)
Albumin: 1.9 g/dL — ABNORMAL LOW (ref 3.5–5.0)
Alkaline Phosphatase: 745 U/L — ABNORMAL HIGH (ref 38–126)
Anion gap: 18 — ABNORMAL HIGH (ref 5–15)
BUN: 81 mg/dL — ABNORMAL HIGH (ref 8–23)
CO2: 14 mmol/L — ABNORMAL LOW (ref 22–32)
Calcium: 8.6 mg/dL — ABNORMAL LOW (ref 8.9–10.3)
Chloride: 117 mmol/L — ABNORMAL HIGH (ref 98–111)
Creatinine, Ser: 2.43 mg/dL — ABNORMAL HIGH (ref 0.61–1.24)
GFR, Estimated: 28 mL/min — ABNORMAL LOW (ref >60)
Glucose, Bld: 53 mg/dL — ABNORMAL LOW (ref 70–99)
Potassium: 6.4 mmol/L (ref 3.5–5.1)
Sodium: 149 mmol/L — ABNORMAL HIGH (ref 135–145)
Total Bilirubin: 1.2 mg/dL (ref 0.3–1.2)
Total Protein: 6.7 g/dL (ref 6.5–8.1)

## 2020-05-04 LAB — CBC WITH DIFFERENTIAL/PLATELET
Abs Immature Granulocytes: 0.17 10*3/uL — ABNORMAL HIGH (ref 0.00–0.07)
Basophils Absolute: 0 10*3/uL (ref 0.0–0.1)
Basophils Relative: 0 %
Eosinophils Absolute: 0.1 10*3/uL (ref 0.0–0.5)
Eosinophils Relative: 1 %
HCT: 27.3 % — ABNORMAL LOW (ref 39.0–52.0)
Hemoglobin: 7.7 g/dL — ABNORMAL LOW (ref 13.0–17.0)
Immature Granulocytes: 2 %
Lymphocytes Relative: 18 %
Lymphs Abs: 1.5 10*3/uL (ref 0.7–4.0)
MCH: 25.6 pg — ABNORMAL LOW (ref 26.0–34.0)
MCHC: 28.2 g/dL — ABNORMAL LOW (ref 30.0–36.0)
MCV: 90.7 fL (ref 80.0–100.0)
Monocytes Absolute: 0.3 10*3/uL (ref 0.1–1.0)
Monocytes Relative: 4 %
Neutro Abs: 5.9 10*3/uL (ref 1.7–7.7)
Neutrophils Relative %: 75 %
Platelets: 430 10*3/uL — ABNORMAL HIGH (ref 150–400)
RBC: 3.01 MIL/uL — ABNORMAL LOW (ref 4.22–5.81)
RDW: 21 % — ABNORMAL HIGH (ref 11.5–15.5)
WBC: 8 10*3/uL (ref 4.0–10.5)
nRBC: 5 % — ABNORMAL HIGH (ref 0.0–0.2)

## 2020-05-04 LAB — BASIC METABOLIC PANEL
Anion gap: 19 — ABNORMAL HIGH (ref 5–15)
BUN: 80 mg/dL — ABNORMAL HIGH (ref 8–23)
CO2: 14 mmol/L — ABNORMAL LOW (ref 22–32)
Calcium: 8.5 mg/dL — ABNORMAL LOW (ref 8.9–10.3)
Chloride: 116 mmol/L — ABNORMAL HIGH (ref 98–111)
Creatinine, Ser: 2.38 mg/dL — ABNORMAL HIGH (ref 0.61–1.24)
GFR, Estimated: 29 mL/min — ABNORMAL LOW (ref >60)
Glucose, Bld: 130 mg/dL — ABNORMAL HIGH (ref 70–99)
Potassium: 5.8 mmol/L — ABNORMAL HIGH (ref 3.5–5.1)
Sodium: 149 mmol/L — ABNORMAL HIGH (ref 135–145)

## 2020-05-04 LAB — I-STAT CHEM 8, ED
BUN: 81 mg/dL — ABNORMAL HIGH (ref 8–23)
Calcium, Ion: 1.12 mmol/L — ABNORMAL LOW (ref 1.15–1.40)
Chloride: 123 mmol/L — ABNORMAL HIGH (ref 98–111)
Creatinine, Ser: 2.3 mg/dL — ABNORMAL HIGH (ref 0.61–1.24)
Glucose, Bld: 51 mg/dL — ABNORMAL LOW (ref 70–99)
HCT: 26 % — ABNORMAL LOW (ref 39.0–52.0)
Hemoglobin: 8.8 g/dL — ABNORMAL LOW (ref 13.0–17.0)
Potassium: 6.3 mmol/L (ref 3.5–5.1)
Sodium: 148 mmol/L — ABNORMAL HIGH (ref 135–145)
TCO2: 15 mmol/L — ABNORMAL LOW (ref 22–32)

## 2020-05-04 LAB — CBC
HCT: 26.6 % — ABNORMAL LOW (ref 39.0–52.0)
Hemoglobin: 7.2 g/dL — ABNORMAL LOW (ref 13.0–17.0)
MCH: 25.2 pg — ABNORMAL LOW (ref 26.0–34.0)
MCHC: 27.1 g/dL — ABNORMAL LOW (ref 30.0–36.0)
MCV: 93 fL (ref 80.0–100.0)
Platelets: 318 10*3/uL (ref 150–400)
RBC: 2.86 MIL/uL — ABNORMAL LOW (ref 4.22–5.81)
RDW: 21.1 % — ABNORMAL HIGH (ref 11.5–15.5)
WBC: 4.8 10*3/uL (ref 4.0–10.5)
nRBC: 5.6 % — ABNORMAL HIGH (ref 0.0–0.2)

## 2020-05-04 LAB — URINALYSIS, ROUTINE W REFLEX MICROSCOPIC
Bilirubin Urine: NEGATIVE
Glucose, UA: NEGATIVE mg/dL
Hgb urine dipstick: NEGATIVE
Ketones, ur: NEGATIVE mg/dL
Leukocytes,Ua: NEGATIVE
Nitrite: NEGATIVE
Protein, ur: NEGATIVE mg/dL
Specific Gravity, Urine: 1.018 (ref 1.005–1.030)
pH: 5 (ref 5.0–8.0)

## 2020-05-04 LAB — GLUCOSE, CAPILLARY
Glucose-Capillary: 63 mg/dL — ABNORMAL LOW (ref 70–99)
Glucose-Capillary: 79 mg/dL (ref 70–99)
Glucose-Capillary: 87 mg/dL (ref 70–99)

## 2020-05-04 LAB — LIPASE, BLOOD: Lipase: 25 U/L (ref 11–51)

## 2020-05-04 LAB — RESP PANEL BY RT-PCR (FLU A&B, COVID) ARPGX2
Influenza A by PCR: NEGATIVE
Influenza B by PCR: NEGATIVE
SARS Coronavirus 2 by RT PCR: NEGATIVE

## 2020-05-04 LAB — PROTIME-INR
INR: 1.4 — ABNORMAL HIGH (ref 0.8–1.2)
Prothrombin Time: 17 seconds — ABNORMAL HIGH (ref 11.4–15.2)

## 2020-05-04 LAB — BRAIN NATRIURETIC PEPTIDE: B Natriuretic Peptide: 35.9 pg/mL (ref 0.0–100.0)

## 2020-05-04 LAB — CBG MONITORING, ED
Glucose-Capillary: 48 mg/dL — ABNORMAL LOW (ref 70–99)
Glucose-Capillary: 103 mg/dL — ABNORMAL HIGH (ref 70–99)
Glucose-Capillary: 78 mg/dL (ref 70–99)

## 2020-05-04 LAB — LACTIC ACID, PLASMA
Lactic Acid, Venous: 5.2 mmol/L (ref 0.5–1.9)
Lactic Acid, Venous: 7 mmol/L (ref 0.5–1.9)
Lactic Acid, Venous: 7.1 mmol/L (ref 0.5–1.9)

## 2020-05-04 LAB — CORTISOL: Cortisol, Plasma: 33.4 ug/dL

## 2020-05-04 LAB — APTT: aPTT: 30 seconds (ref 24–36)

## 2020-05-04 MED ORDER — POLYETHYLENE GLYCOL 3350 17 G PO PACK: 17.0000 g | PACK | Freq: Every day | ORAL | Status: DC | PRN

## 2020-05-04 MED ORDER — INSULIN ASPART 100 UNIT/ML IV SOLN
10.0000 [IU] | Freq: Once | INTRAVENOUS | Status: AC
  Administered 2020-05-04: 10 [IU] via INTRAVENOUS

## 2020-05-04 MED ORDER — ROCURONIUM BROMIDE 50 MG/5ML IV SOLN
INTRAVENOUS | Status: AC | PRN
  Administered 2020-05-04: 90 mg via INTRAVENOUS

## 2020-05-04 MED ORDER — MIDAZOLAM 50MG/50ML (1MG/ML) PREMIX INFUSION
0.5000 mg/h | INTRAVENOUS | Status: DC
  Administered 2020-05-04: 0.5 mg/h via INTRAVENOUS
  Administered 2020-05-05: 4 mg/h via INTRAVENOUS
  Filled 2020-05-04 (×2): qty 50

## 2020-05-04 MED ORDER — LACTATED RINGERS IV BOLUS (SEPSIS)
1000.0000 mL | Freq: Once | INTRAVENOUS | Status: AC
  Administered 2020-05-04: 1000 mL via INTRAVENOUS

## 2020-05-04 MED ORDER — SODIUM BICARBONATE 8.4 % IV SOLN
50.0000 meq | Freq: Once | INTRAVENOUS | Status: AC
  Administered 2020-05-04: 50 meq via INTRAVENOUS

## 2020-05-04 MED ORDER — VANCOMYCIN HCL IN DEXTROSE 1-5 GM/200ML-% IV SOLN: 1000.0000 mg | Freq: Once | INTRAVENOUS | Status: DC

## 2020-05-04 MED ORDER — VANCOMYCIN HCL 1250 MG/250ML IV SOLN
1250.0000 mg | INTRAVENOUS | Status: DC
  Administered 2020-05-05: 1250 mg via INTRAVENOUS
  Filled 2020-05-04: qty 250

## 2020-05-04 MED ORDER — DOCUSATE SODIUM 50 MG/5ML PO LIQD
100.0000 mg | Freq: Two times a day (BID) | ORAL | Status: DC | PRN
  Administered 2020-05-05: 100 mg
  Filled 2020-05-04: qty 10

## 2020-05-04 MED ORDER — DEXTROSE 50 % IV SOLN
12.5000 g | Freq: Once | INTRAVENOUS | Status: AC
  Administered 2020-05-05: 12.5 g via INTRAVENOUS
  Filled 2020-05-04: qty 50

## 2020-05-04 MED ORDER — LACTATED RINGERS IV BOLUS
1000.0000 mL | Freq: Once | INTRAVENOUS | Status: AC
  Administered 2020-05-04: 1000 mL via INTRAVENOUS

## 2020-05-04 MED ORDER — ORAL CARE MOUTH RINSE
15.0000 mL | OROMUCOSAL | Status: DC
  Administered 2020-05-04 – 2020-05-05 (×7): 15 mL via OROMUCOSAL

## 2020-05-04 MED ORDER — DOCUSATE SODIUM 50 MG/5ML PO LIQD
100.0000 mg | Freq: Two times a day (BID) | ORAL | Status: DC
  Filled 2020-05-04 (×5): qty 10

## 2020-05-04 MED ORDER — PIPERACILLIN-TAZOBACTAM 3.375 G IVPB: 3.3750 g | Freq: Once | INTRAVENOUS | Status: DC

## 2020-05-04 MED ORDER — FENTANYL 2500MCG IN NS 250ML (10MCG/ML) PREMIX INFUSION
0.0000 ug/h | INTRAVENOUS | Status: DC
  Administered 2020-05-04: 25 ug/h via INTRAVENOUS
  Administered 2020-05-04: 300 ug/h via INTRAVENOUS
  Administered 2020-05-05: 250 ug/h via INTRAVENOUS
  Filled 2020-05-04 (×3): qty 250

## 2020-05-04 MED ORDER — ONDANSETRON HCL 4 MG/2ML IJ SOLN: 4.0000 mg | Freq: Four times a day (QID) | INTRAMUSCULAR | Status: DC | PRN

## 2020-05-04 MED ORDER — PANTOPRAZOLE SODIUM 40 MG PO PACK
40.0000 mg | PACK | Freq: Every day | ORAL | Status: DC
  Administered 2020-05-04 – 2020-05-05 (×2): 40 mg
  Filled 2020-05-04 (×2): qty 20

## 2020-05-04 MED ORDER — SODIUM BICARBONATE 8.4 % IV SOLN
INTRAVENOUS | Status: AC | PRN
  Administered 2020-05-04 (×2): 50 meq via INTRAVENOUS

## 2020-05-04 MED ORDER — CHLORHEXIDINE GLUCONATE 0.12% ORAL RINSE (MEDLINE KIT)
15.0000 mL | Freq: Two times a day (BID) | OROMUCOSAL | Status: DC
  Administered 2020-05-04 – 2020-05-05 (×2): 15 mL via OROMUCOSAL

## 2020-05-04 MED ORDER — SODIUM CHLORIDE 0.9 % IV SOLN
2.0000 g | Freq: Two times a day (BID) | INTRAVENOUS | Status: DC
  Administered 2020-05-05 (×2): 2 g via INTRAVENOUS
  Filled 2020-05-04 (×2): qty 2

## 2020-05-04 MED ORDER — NOREPINEPHRINE 4 MG/250ML-% IV SOLN
0.0000 ug/min | INTRAVENOUS | Status: DC
  Administered 2020-05-04: 20 ug/min via INTRAVENOUS
  Administered 2020-05-04: 15 ug/min via INTRAVENOUS
  Administered 2020-05-04: 17 ug/min via INTRAVENOUS
  Administered 2020-05-05: 18 ug/min via INTRAVENOUS
  Administered 2020-05-05: 16 ug/min via INTRAVENOUS
  Administered 2020-05-05 (×2): 18 ug/min via INTRAVENOUS
  Filled 2020-05-04 (×6): qty 250

## 2020-05-04 MED ORDER — POLYETHYLENE GLYCOL 3350 17 G PO PACK
17.0000 g | PACK | Freq: Every day | ORAL | Status: DC
  Administered 2020-05-05: 17 g
  Filled 2020-05-04: qty 1

## 2020-05-04 MED ORDER — FENTANYL CITRATE (PF) 100 MCG/2ML IJ SOLN
25.0000 ug | INTRAMUSCULAR | Status: DC | PRN
  Administered 2020-05-04 (×2): 100 ug via INTRAVENOUS
  Filled 2020-05-04 (×2): qty 2

## 2020-05-04 MED ORDER — ETOMIDATE 2 MG/ML IV SOLN
INTRAVENOUS | Status: AC | PRN
  Administered 2020-05-04: 20 mg via INTRAVENOUS

## 2020-05-04 MED ORDER — LACTATED RINGERS IV SOLN: INTRAVENOUS | Status: DC

## 2020-05-04 MED ORDER — DEXTROSE 50 % IV SOLN
1.0000 | Freq: Once | INTRAVENOUS | Status: AC
  Administered 2020-05-04: 50 mL via INTRAVENOUS
  Filled 2020-05-04: qty 50

## 2020-05-04 MED ORDER — DEXTROSE 50 % IV SOLN
2.0000 | INTRAVENOUS | Status: AC
  Administered 2020-05-04: 100 mL via INTRAVENOUS
  Filled 2020-05-04: qty 100

## 2020-05-04 MED ORDER — SODIUM ZIRCONIUM CYCLOSILICATE 10 G PO PACK
10.0000 g | PACK | Freq: Once | ORAL | Status: AC
  Administered 2020-05-04: 10 g
  Filled 2020-05-04: qty 1

## 2020-05-04 MED ORDER — MIDAZOLAM HCL 2 MG/2ML IJ SOLN
1.0000 mg | INTRAMUSCULAR | Status: DC | PRN
  Administered 2020-05-04 (×2): 1 mg via INTRAVENOUS
  Filled 2020-05-04 (×2): qty 2

## 2020-05-04 MED ORDER — CALCIUM GLUCONATE 10 % IV SOLN
1.0000 g | Freq: Once | INTRAVENOUS | Status: AC
  Administered 2020-05-04: 1 g via INTRAVENOUS
  Filled 2020-05-04: qty 10

## 2020-05-04 MED ORDER — SODIUM CHLORIDE 0.9 % IV SOLN
2.0000 g | Freq: Once | INTRAVENOUS | Status: AC
  Administered 2020-05-04: 2 g via INTRAVENOUS
  Filled 2020-05-04: qty 2

## 2020-05-04 MED ORDER — VANCOMYCIN HCL 1500 MG/300ML IV SOLN
1500.0000 mg | Freq: Once | INTRAVENOUS | Status: AC
  Administered 2020-05-04: 1500 mg via INTRAVENOUS
  Filled 2020-05-04: qty 300

## 2020-05-04 MED ORDER — SODIUM CHLORIDE 0.9 % IV BOLUS
1000.0000 mL | Freq: Once | INTRAVENOUS | Status: AC
  Administered 2020-05-04: 1000 mL via INTRAVENOUS

## 2020-05-04 MED ORDER — HEPARIN SODIUM (PORCINE) 5000 UNIT/ML IJ SOLN
5000.0000 [IU] | Freq: Three times a day (TID) | INTRAMUSCULAR | Status: DC
  Administered 2020-05-04 – 2020-05-05 (×3): 5000 [IU] via SUBCUTANEOUS
  Filled 2020-05-04 (×3): qty 1

## 2020-05-04 NOTE — ED Notes (Signed)
1 amp D50 given per MD verbal order

## 2020-05-04 NOTE — Progress Notes (Signed)
eLink Physician-Brief Progress Note Patient Name: Scott [name] DOB: [birth year] MRN: [id number]   Date of Service  06/01/2020  HPI/Events of Note  Hypotension - BP = 72/51 on Norepinephrine IV infusion at 17 mcg/min via PIV. LVEF = 70-75%  eICU Interventions  Plan: 1. Bolus with 0.9 NaCl 1 liter IV over 1 hour now.  2. Titrate Norepinephrine IV infusion as needed. 3. Ground team notified of need for CVL placement.     Intervention Category Major Interventions: Hypotension - evaluation and management  Dayshawn Irizarry Eugene 05/06/2020, 7:54 PM

## 2020-05-04 NOTE — Progress Notes (Signed)
Notified bedside nurse of need to draw lactic acid.  

## 2020-05-04 NOTE — ED Notes (Signed)
Report given to [facility], [place] Mam, RN

## 2020-05-04 NOTE — ED Provider Notes (Addendum)
[facility] EMERGENCY DEPARTMENT Provider Note   CSN: [id number] Arrival date & time: 05/29/2020  8101     History Chief Complaint  Patient presents with   Code Sepsis    Scott [name] is a 69 y.o. male.  Patient brought in from nursing facility with concern for sepsis.  Patient febrile with a temp to 102 axillary tachycardic respiratory rate up in the 60s.  Patient was a 15 L nonrebreather sats are in the mid to high 90s according to EMS.  Patient is normally alert but not oriented.  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  According to patient's last admission he has a full code.  Also appears with review of most recent admission the patient had follow-up with hematology oncology scheduled for December 6 to go over the liver biopsy and the evidence of the liver mets.        Past Medical History:  Diagnosis Date   Aggression aggravated 08/20/2018   Anxiety    Chronic hyponatremia 05/2013, 11/2013   Dementia ([place])    Dyslipidemia    GERD (gastroesophageal reflux disease)    Hypertension    Hypoglycemia    Schizophrenia ()    Seizures ([place])    Syncope    Tardive dyskinesia    Urinary tract infection    Vitamin D deficiency     Patient Active Problem List   Diagnosis Date Noted   Metabolic encephalopathy 75/03/2584   AKI (acute kidney injury) ([place]) 04/19/2020   Hyperkalemia 04/19/2020   Dementia ([place])    Generalized weakness 10/14/2017   Diabetes ([place]) 02/07/2016   Schizophrenia, undifferentiated ([place]) 01/24/2016   GERD (gastroesophageal reflux disease) 01/24/2016   Tardive dyskinesia 01/24/2016   Chronic hyponatremia 01/24/2016   Hypertension 01/24/2016    Past Surgical  History:  Procedure Laterality Date   CATARACT EXTRACTION W/PHACO Left 01/10/2016   Procedure: CATARACT EXTRACTION PHACO AND INTRAOCULAR LENS PLACEMENT ([place]);  Surgeon: Birder Robson, MD;  Location: [facility] ORS;  Service: Ophthalmology;  Laterality: Left;  [place] 00:53 AP% 24.2 CDE 12.88 fluid pack lot # [id number]   EYE SURGERY     FOOT SURGERY         No family history on file.  Social History   Tobacco Use   Smoking status: Current Every Day Smoker    Packs/day: 0.50    Types: Cigarettes   Smokeless tobacco: Never Used  Vaping Use   Vaping Use: Never used  Substance Use Topics   Alcohol use: No   Drug use: No    Home Medications Prior to Admission medications   Medication Sig Start Date End Date Taking? Authorizing Provider  atorvastatin (LIPITOR) 40 MG tablet Take 1 tablet (40 mg total) by mouth daily for 7 days. Patient taking differently: Take 40 mg by mouth at bedtime.  03/28/20   Harvest Dark, MD  benztropine (COGENTIN) 1 MG tablet Take 1 tablet (1 mg total) by mouth 2 (two) times daily for 7 days. 03/28/20   Harvest Dark, MD  clonazePAM (KLONOPIN) 0.5 MG tablet Take 1 tablet (0.5 mg total) by mouth 2 (two) times daily for 7 days. 03/28/20   Harvest Dark, MD  divalproex (DEPAKOTE ER) 250 MG 24 hr tablet Take 3 tablets (750 mg total)  by mouth daily. 02/22/20   Carrie Mew, MD  famotidine (PEPCID) 20 MG tablet Take 1 tablet (20 mg total) by mouth daily for 7 days. Patient taking differently: Take 20 mg by mouth at bedtime.  03/28/20   Harvest Dark, MD  ferrous sulfate 325 (65 FE) MG tablet Take 1 tablet (325 mg total) by mouth 2 (two) times daily with a meal. 04/19/20 05/19/20  George Hugh, MD  haloperidol (HALDOL) 2 MG tablet Take 1 tablet (2 mg total) by mouth daily. 03/28/20   Harvest Dark, MD  haloperidol (HALDOL) 5 MG tablet Take 1 tablet (5 mg total) by mouth at bedtime. 03/28/20   Harvest Dark, MD  insulin glargine  (LANTUS) 100 UNIT/ML injection Inject 18 Units into the skin at bedtime.    [provider]  insulin glargine (LANTUS) 100 UNIT/ML injection 18 units QHS Patient taking differently: Inject 18 Units into the skin at bedtime.  03/28/20 03/28/21  Harvest Dark, MD  metFORMIN (GLUCOPHAGE) 1000 MG tablet Take 1 tablet (1,000 mg total) by mouth 2 (two) times daily with a meal for 7 days. 03/28/20   Harvest Dark, MD  OLANZapine (ZYPREXA) 15 MG tablet Take 1 tablet (15 mg total) by mouth in the morning and at bedtime. 03/28/20   Harvest Dark, MD  tamsulosin (FLOMAX) 0.4 MG CAPS capsule Take 1 capsule (0.4 mg total) by mouth daily. Patient taking differently: Take 0.4 mg by mouth at bedtime.  03/28/20   Harvest Dark, MD  traZODone (DESYREL) 150 MG tablet Take 1 tablet (150 mg total) by mouth at bedtime. 03/28/20   Harvest Dark, MD    Allergies    Patient has no known allergies.  Review of Systems   Review of Systems  Unable to perform ROS: Severe respiratory distress    Physical Exam Updated Vital Signs BP (!) 87/60    Pulse (!) 124    Temp (!) 100.4 F (38 C)    Resp (!) 28    SpO2 95%   Physical Exam Vitals and nursing note reviewed.  Constitutional:      General: He is in acute distress.     Appearance: He is well-developed. He is ill-appearing and toxic-appearing.  HENT:     Head: Normocephalic and atraumatic.     Mouth/Throat:     Mouth: Mucous membranes are dry.  Eyes:     Conjunctiva/sclera: Conjunctivae normal.  Cardiovascular:     Rate and Rhythm: Regular rhythm. Tachycardia present.     Heart sounds: No murmur heard.   Pulmonary:     Effort: Respiratory distress present.     Breath sounds: Normal breath sounds. No wheezing.     Comments: Very tachypneic in respiratory distress.  100% nonrebreather and nasal cannula oxygen still satting below 90% on room air. Abdominal:     Palpations: Abdomen is soft.     Tenderness: There is no  abdominal tenderness.  Musculoskeletal:        General: No swelling.     Cervical back: Neck supple.  Skin:    General: Skin is warm and dry.  Neurological:     Mental Status: He is alert.     Comments: Patient not responsive.     ED Results / Procedures / Treatments   Labs (all labs ordered are listed, but only abnormal results are displayed) Labs Reviewed  LACTIC ACID, PLASMA - Abnormal; Notable for the following components:      Result Value   Lactic Acid, Venous 5.2 (*)  All other components within normal limits  CBC WITH DIFFERENTIAL/PLATELET - Abnormal; Notable for the following components:   RBC 3.01 (*)    Hemoglobin 7.7 (*)    HCT 27.3 (*)    MCH 25.6 (*)    MCHC 28.2 (*)    RDW 21.0 (*)    Platelets 430 (*)    nRBC 5.0 (*)    Abs Immature Granulocytes 0.17 (*)    All other components within normal limits  PROTIME-INR - Abnormal; Notable for the following components:   Prothrombin Time 17.0 (*)    INR 1.4 (*)    All other components within normal limits  URINALYSIS, ROUTINE W REFLEX MICROSCOPIC - Abnormal; Notable for the following components:   Color, Urine AMBER (*)    APPearance HAZY (*)    All other components within normal limits  CBG MONITORING, ED - Abnormal; Notable for the following components:   Glucose-Capillary 48 (*)    All other components within normal limits  I-STAT CHEM 8, ED - Abnormal; Notable for the following components:   Sodium 148 (*)    Potassium 6.3 (*)    Chloride 123 (*)    BUN 81 (*)    Creatinine, Ser 2.30 (*)    Glucose, Bld 51 (*)    Calcium, Ion 1.12 (*)    TCO2 15 (*)    Hemoglobin 8.8 (*)    HCT 26.0 (*)    All other components within normal limits  CBG MONITORING, ED - Abnormal; Notable for the following components:   Glucose-Capillary 103 (*)    All other components within normal limits  I-STAT ARTERIAL BLOOD GAS, ED - Abnormal; Notable for the following components:   pCO2 arterial 19.7 (*)    pO2, Arterial 46  (*)    Bicarbonate 13.1 (*)    TCO2 14 (*)    Acid-base deficit 10.0 (*)    Potassium 5.7 (*)    HCT 20.0 (*)    Hemoglobin 6.8 (*)    All other components within normal limits  CULTURE, BLOOD (ROUTINE X 2)  CULTURE, BLOOD (ROUTINE X 2)  URINE CULTURE  RESP PANEL BY RT-PCR (FLU A&B, COVID) ARPGX2  APTT  BRAIN NATRIURETIC PEPTIDE  LACTIC ACID, PLASMA  COMPREHENSIVE METABOLIC PANEL  LIPASE, BLOOD  PATHOLOGIST SMEAR REVIEW  BLOOD GAS, ARTERIAL    EKG EKG Interpretation  Date/Time:  Wednesday May 04 2020 09:56:05 EST Ventricular Rate:  129 PR Interval:    QRS Duration: 97 QT Interval:  300 QTC Calculation: 440 R Axis:   65 Text Interpretation: Sinus tachycardia Low voltage, extremity leads Anteroseptal infarct, age indeterminate Nonspecific T abnormalities, lateral leads Confirmed by Fredia Sorrow [phone]) on 06/02/2020 10:43:25 AM   Radiology DG Chest Port 1 View  Result Date: 05/17/2020 CLINICAL DATA:  Hypoxia EXAM: PORTABLE CHEST 1 VIEW COMPARISON:  April 18, 2020 chest radiograph and chest CT April 20, 2020 FINDINGS: Endotracheal tube tip is 5.8 cm above the carina. Nasogastric tube extends to the distal esophagus where it loops upon itself. Nasogastric tube present. Nasogastric tube tip uncertain. It overlies the left lower lobe. It is not clearly within stomach or esophagus. Given degree of rotation, tube may be within distal esophagus. There is airspace consolidation with volume loss throughout much of the left lower lobe. Right lung is clear. Heart size and pulmonary vascularity are normal. No adenopathy. No bone lesions. IMPRESSION: 1. Exact location of nasogastric tube tip uncertain. Suspect tube coiled in distal esophagus given degree of  rotation. Tube tip is not within stomach. 2. Endotracheal tube as described.  No pneumothorax. 3. Extensive airspace consolidation, likely multifocal pneumonia, throughout much of the left lung. Right lung is clear. Heart size  within normal limits. These results were called by telephone at the time of interpretation on 05/20/2020 at 11:04 am to provider Fredia Sorrow , who verbally acknowledged these results. Electronically Signed   By: Lowella Grip III M.D.   On: 06/01/2020 11:04    Procedures Date/Time: 05/21/2020 11:42 AM Performed by: Fredia Sorrow, MD Oxygen Delivery Method: Ambu bag Preoxygenation: Pre-oxygenation with 100% oxygen Ventilation: Mask ventilation without difficulty Laryngoscope Size: Glidescope and 4 Tube size: 7.5 mm Number of attempts: 1 Secured at: 26 cm Tube secured with: ETT holder      (including critical care time) CRITICAL CARE Performed by: Fredia Sorrow Total critical care time: 60 minutes Critical care time was exclusive of separately billable procedures and treating other patients. Critical care was necessary to treat or prevent imminent or life-threatening deterioration. Critical care was time spent personally by me on the following activities: development of treatment plan with patient and/or surrogate as well as nursing, discussions with consultants, evaluation of patient's response to treatment, examination of patient, obtaining history from patient or surrogate, ordering and performing treatments and interventions, ordering and review of laboratory studies, ordering and review of radiographic studies, pulse oximetry and re-evaluation of patient's condition.   Medications Ordered in ED Medications  lactated ringers infusion (has no administration in time range)  lactated ringers bolus 1,000 mL (1,000 mLs Intravenous New Bag/Given 05/08/2020 1037)    And  lactated ringers bolus 1,000 mL (1,000 mLs Intravenous New Bag/Given 05/09/2020 1107)    And  lactated ringers bolus 1,000 mL (has no administration in time range)  vancomycin (VANCOREADY) IVPB 1500 mg/300 mL (1,500 mg Intravenous New Bag/Given 05/21/2020 1059)  vancomycin (VANCOREADY) IVPB 1250 mg/250 mL (has no  administration in time range)  ceFEPIme (MAXIPIME) 2 g in sodium chloride 0.9 % 100 mL IVPB (has no administration in time range)  calcium gluconate inj 10% (1 g) URGENT USE ONLY! (has no administration in time range)  ceFEPIme (MAXIPIME) 2 g in sodium chloride 0.9 % 100 mL IVPB (2 g Intravenous New Bag/Given 05/27/2020 1050)  dextrose 50 % solution 50 mL (50 mLs Intravenous Given 05/24/2020 1008)  sodium bicarbonate injection (50 mEq Intravenous Given 06/01/2020 1030)  etomidate (AMIDATE) injection (20 mg Intravenous Given 06/01/2020 1031)  rocuronium (ZEMURON) injection (90 mg Intravenous Given 05/13/2020 1031)    ED Course  I have reviewed the triage vital signs and the nursing notes.  Pertinent labs & imaging results that were available during my care of the patient were reviewed by me and considered in my medical decision making (see chart for details).    MDM Rules/Calculators/A&P                         Due to respiratory distress and patient not satting 90% or better and with increased respiratory rate.  Patient was intubated.  We also noted that his blood sugar was very low upon arrival he received an amp of D50.  We knew that in the past that he had acute kidney injury.  And was suspicious the potassium may have been elevated.  He was intubated with etomidate and rocuronium.  Patient occurred without any difficulty.  But after intubation patient had some desaturation problems with sats were sort of maintaining in the  7172%.  Several chest x-ray were shot but showed a significant consolidation on the left side right side appeared normal.  No evidence of any pneumothorax.  The NG tube was curled up.  This is been removed and replaced.  Endotracheal tube was in good position.  Patient was febrile patient was tachycardic respiratory rate was up he was hypotensive.  Patient was started on sepsis protocol.  Started on broad-spectrum antibiotics.  Patient still undergoing the fluid resuscitation.  Blood  pressures now are little marginal although they did get up into the 120s postintubation.  Now he is satting in the upper 80s.  Discussed with critical care team who will see patient for admission.  Stated patient is a full code according to most recent admission.  Final Clinical Impression(s) / ED Diagnoses Final diagnoses:  Sepsis, due to unspecified organism, unspecified whether acute organ dysfunction present ([place])  Hypoglycemia  Acute respiratory failure with hypoxia ([place])  Hyperkalemia    Rx / DC Orders ED Discharge Orders    None       Fredia Sorrow, MD 05/17/2020 1144    Fredia Sorrow, MD 05/06/2020 1154

## 2020-05-04 NOTE — H&P (Signed)
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  He presents to [facility] emergency department today acute respiratory failure, acute renal failure, hyperkalemia, hypoglycemia and generalized failure to thrive intubated by the emergency department physician.  Fluid resuscitation has been started with the sepsis protocol.  He is often placed on empirical antimicrobial therapy.  His case manager department social services been called and asked to set up a time to meet with family on her to address CODE STATUS.  Currently he seems to be responding to fluid resuscitation antimicrobial therapy.  Past Medical History   Past Medical History:  Diagnosis Date  . Aggression aggravated 08/20/2018  . Anxiety   . Chronic hyponatremia 05/2013, 11/2013  . Dementia ([place])   . Dyslipidemia   . GERD (gastroesophageal reflux disease)   . Hypertension   . Hypoglycemia   . Schizophrenia ([place])   . Seizures ([place])   . Syncope   . Tardive dyskinesia   . Urinary tract infection   . Vitamin D deficiency      Significant Hospital Events     Consults:    Procedures:  05/22/2020 intubation per EDP  Significant Diagnostic Tests:     Micro Data:  05/18/2020 blood cultures x2 05/19/2020 sputum culture  Antimicrobials:  05/28/2020 vancomycin 05/18/2020 cefepime  Interim history/subjective:    Objective   Blood pressure (!) 85/50, pulse (!) 122, temperature (!) 100.4 F (38 C), resp. rate (!) 26, SpO2 94 %.    Vent Mode: PRVC FiO2 (%):  [100 %] 100 % Set Rate:  [34 bmp] 34 bmp Vt Set:  [600 mL] 600 mL PEEP:  [12 cmH20] 12 cmH20 Plateau Pressure:  [31 cmH20] 31 cmH20  No intake or output data in the 24 hours ending 05/04/20 1209 There were no vitals filed for this visit.  Examination: General: Appearing disheveled male on full mechanical ventilatory support HENT: JVD three quarters up Lungs: Decreased breath sounds left base Cardiovascular: Heart sounds are distant Abdomen: Abdomen old scars mid abdomen faint bowel Extremities: No edema Neuro: Nonresponsive to any verbal or physical stimuli GU: Hematuria.  Noted to be a Foley  Resolved Hospital Problem list     Assessment & Plan:  Ventilator dependent respiratory failure in setting of multiple comorbidities with history of COPD and chronic respiratory issues O2 dependent.  With known adenocarcinoma from recent liver biopsy 04/18/2020 but shows mets to left humerus right ribs.  Suspected left lower lobe pneumonia.  ventilator bundle Empirical antimicrobial therapy Sputum culture Blood culture Questionable need for heme-onc input  Hypotension in the setting of suspected sepsis: Failure to thrive and recent new diagnosis of metastatic adenocarcinoma. Fluid resuscitation Empirical antimicrobial to May need pressor support Check cortisol level Questionable 2D echo will hold for  now  Acute renal insufficiency Lab Results  Component Value Date   CREATININE 2.30 (H) 05/18/2020   CREATININE 0.72 04/22/2020   CREATININE 0.76 04/21/2020   CREATININE 1.10 07/07/2014   CREATININE 1.46 (H) 04/22/2014   CREATININE 0.86 11/15/2013   Suspect fluid  resuscitation will correct renal insufficiency Monitor creatinine   Electrolyte disturbances Recent Labs  Lab 05/15/2020 1022 05/09/2020 1026  K 5.7* 6.3*   Medically treated Repeat electrolytes and creatinine Suspect fluid resuscitation should correct  History of schizophrenia and resides in a nursing home. Tar dive dyskinesia Currently a moot point May have to resume antipsychotics if you  Hypoglycemia D50 Sliding-scale insulin protocol every 4 hours  Best practice (evaluated daily)   Diet: NPO Pain/Anxiety/Delirium protocol (if indicated): Not indicated VAP protocol (if indicated): In place DVT prophylaxis: Heparin GI prophylaxis: PPI Glucose control: Sliding scale insulin protocol Mobility: Bedrest last date of multidisciplinary goals of care discussion 05/08/2020 social services called and spoke with social worker [name] [phone] who is the legal guardian.  Of asked her to get the family together bring them in for goals of care discussion in the setting of metastatic cancer from her history of colon cancer coupled with multiple health issues. Family and staff present none Summary of discussion arrange for group discussion with family and social worker Follow up goals of care discussion due when available Code Status: Full Disposition: ICU  Labs   CBC: Recent Labs  Lab 05/09/2020 1006 05/22/2020 1022 05/31/2020 1026  WBC 8.0  --   --   NEUTROABS 5.9  --   --   HGB 7.7* 6.8* 8.8*  HCT 27.3* 20.0* 26.0*  MCV 90.7  --   --   PLT 430*  --   --     Basic Metabolic Panel: Recent Labs  Lab 05/06/2020 1022 06/02/2020 1026  NA 145 148*  K 5.7* 6.3*  CL  --  123*  GLUCOSE  --  51*  BUN  --  81*  CREATININE  --  2.30*   GFR: CrCl cannot be calculated (Unknown ideal weight.). Recent Labs  Lab 05/25/2020 1006  WBC 8.0  LATICACIDVEN 5.2*    Liver Function Tests: No results for input(s): AST, ALT, ALKPHOS, BILITOT, PROT, ALBUMIN in the last 168 hours. No  results for input(s): LIPASE, AMYLASE in the last 168 hours. No results for input(s): AMMONIA in the last 168 hours.  ABG    Component Value Date/Time   PHART 7.433 05/15/2020 1022   PCO2ART 19.7 (LL) 05/13/2020 1022   PO2ART 46 (L) 05/25/2020 1022   HCO3 13.1 (L) 05/10/2020 1022   TCO2 15 (L) 05/07/2020 1026   ACIDBASEDEF 10.0 (H) 05/04/2020 1022   O2SAT 85.0 05/04/2020 1022     Coagulation Profile: Recent Labs  Lab 05/04/20 1006  INR 1.4*    Cardiac Enzymes: No results for input(s): CKTOTAL, CKMB, CKMBINDEX, TROPONINI in the last 168 hours.  HbA1C: Hemoglobin A1C  Date/Time Value Ref Range Status  11/15/2013 06:16 AM 5.8 4.2 - 6.3 % Final    Comment:    The American Diabetes Association recommends that a primary goal of therapy should be <7% and that physicians should reevaluate the treatment regimen in patients with HbA1c values consistently >8%.   11/05/2013 05:37 AM 6.1 4.2 - 6.3 % Final    Comment:    The American Diabetes Association recommends that a primary goal of therapy should be <7% and that physicians should reevaluate the treatment regimen in patients  with HbA1c values consistently >8%.    Hgb A1c MFr Bld  Date/Time Value Ref Range Status  03/13/2020 01:28 PM 7.6 (H) 4.8 - 5.6 % Final    Comment:    (NOTE) Pre diabetes:          5.7%-6.4%  Diabetes:              >6.4%  Glycemic control for   <7.0% adults with diabetes   10/14/2017 02:54 PM 5.5 4.8 - 5.6 % Final    Comment:    (NOTE) Pre diabetes:          5.7%-6.4% Diabetes:              >6.4% Glycemic control for   <7.0% adults with diabetes     CBG: Recent Labs  Lab 05/31/2020 1005 05/31/2020 1054  GLUCAP 48* 103*    Review of Systems:   na  Past Medical History  He,  has a past medical history of Aggression aggravated (08/20/2018), Anxiety, Chronic hyponatremia (05/2013, 11/2013), Dementia ([place]), Dyslipidemia, GERD (gastroesophageal reflux disease), Hypertension, Hypoglycemia,  Schizophrenia ([place]), Seizures ([place]), Syncope, Tardive dyskinesia, Urinary tract infection, and Vitamin D deficiency.   Surgical History    Past Surgical History:  Procedure Laterality Date  . CATARACT EXTRACTION W/PHACO Left 01/10/2016   Procedure: CATARACT EXTRACTION PHACO AND INTRAOCULAR LENS PLACEMENT (IOC);  Surgeon: Birder Robson, MD;  Location: [facility] ORS;  Service: Ophthalmology;  Laterality: Left;  [place] 00:53 AP% 24.2 CDE 12.88 fluid pack lot # [id number]  . EYE SURGERY    . FOOT SURGERY       Social History   reports that he has been smoking cigarettes. He has been smoking about 0.50 packs per day. He has never used smokeless tobacco. He reports that he does not drink alcohol and does not use drugs.   Family History   His family history is not on file.   Allergies No Known Allergies   Home Medications  Prior to Admission medications   Medication Sig Start Date End Date Taking? Authorizing Provider  atorvastatin (LIPITOR) 40 MG tablet Take 1 tablet (40 mg total) by mouth daily for 7 days. Patient taking differently: Take 40 mg by mouth at bedtime.  03/28/20   Harvest Dark, MD  benztropine (COGENTIN) 1 MG tablet Take 1 tablet (1 mg total) by mouth 2 (two) times daily for 7 days. 03/28/20   Harvest Dark, MD  clonazePAM (KLONOPIN) 0.5 MG tablet Take 1 tablet (0.5 mg total) by mouth 2 (two) times daily for 7 days. 03/28/20   Harvest Dark, MD  divalproex (DEPAKOTE ER) 250 MG 24 hr tablet Take 3 tablets (750 mg total) by mouth daily. 02/22/20   Carrie Mew, MD  famotidine (PEPCID) 20 MG tablet Take 1 tablet (20 mg total) by mouth daily for 7 days. Patient taking differently: Take 20 mg by mouth at bedtime.  03/28/20   Harvest Dark, MD  ferrous sulfate 325 (65 FE) MG tablet Take 1 tablet (325 mg total) by mouth 2 (two) times daily with a meal. 04/19/20 05/19/20  George Hugh, MD  haloperidol (HALDOL) 2 MG tablet Take 1 tablet (2 mg total) by mouth daily.  03/28/20   Harvest Dark, MD  haloperidol (HALDOL) 5 MG tablet Take 1 tablet (5 mg total) by mouth at bedtime. 03/28/20   Harvest Dark, MD  insulin glargine (LANTUS) 100 UNIT/ML injection Inject 18 Units into the skin at bedtime.    [provider]  insulin glargine (LANTUS)  100 UNIT/ML injection 18 units QHS Patient taking differently: Inject 18 Units into the skin at bedtime.  03/28/20 03/28/21  Harvest Dark, MD  metFORMIN (GLUCOPHAGE) 1000 MG tablet Take 1 tablet (1,000 mg total) by mouth 2 (two) times daily with a meal for 7 days. 03/28/20   Harvest Dark, MD  OLANZapine (ZYPREXA) 15 MG tablet Take 1 tablet (15 mg total) by mouth in the morning and at bedtime. 03/28/20   Harvest Dark, MD  tamsulosin (FLOMAX) 0.4 MG CAPS capsule Take 1 capsule (0.4 mg total) by mouth daily. Patient taking differently: Take 0.4 mg by mouth at bedtime.  03/28/20   Harvest Dark, MD  traZODone (DESYREL) 150 MG tablet Take 1 tablet (150 mg total) by mouth at bedtime. 03/28/20   Harvest Dark, MD     Critical care time: 30    Richardson Landry Kohei Antonellis ACNP Acute Care Nurse Practitioner [place] Please consult Amion 05/14/2020, 12:09 PM

## 2020-05-04 NOTE — Progress Notes (Signed)
eLink is monitoring this sepsis. Thank you 

## 2020-05-04 NOTE — ED Notes (Signed)
EDP aware pt hypotensive

## 2020-05-04 NOTE — ED Triage Notes (Signed)
Pt came in via ems due to sepsis. Pts rr of 60, febrile with a temp of 102 axillary, pulse in the 120s. Pt is on 15 l nrb and sats are in the mid to high 90s. Pt is normally alert but not orientated. Pt has a hx of schizophrenia, dementia.

## 2020-05-04 NOTE — Code Documentation (Signed)
antibiotic delayed due to intubation

## 2020-05-04 NOTE — Code Documentation (Signed)
Positive color change, bilateral breath sounds, intubation successful

## 2020-05-04 NOTE — Progress Notes (Signed)
Pharmacy Antibiotic Note  Scott [name] is a 69 y.o. male admitted on 05/20/2020 with sepsis.  Pharmacy has been consulted for vancomycin and cefepime dosing.  AKI - SCr 2.3 (BL SCr 0.7), nCrCL 31 ml/min.  Tmax 101.   Estimated weight = 80kg  Plan: Vanc 1500mg  IV x 1, then 1250mg  IV Q24H for goal trough 15-20 mcg/mL Cefepime 2gm IV Q12H Monitor renal fxn, clinical progress, vanc trough as indicated F/U updated weight    Temp (24hrs), Avg:101 F (38.3 C), Min:101 F (38.3 C), Max:101 F (38.3 C)  Recent Labs  Lab 05/11/2020 1006 05/06/2020 1026  WBC PENDING  --   CREATININE  --  2.30*    CrCl cannot be calculated (Unknown ideal weight.).    No Known Allergies   Vanc 12/1 >> Cefepime 12/1 >>  12/1 UCx -  12/1 BCx -   Kathryn Cosby D. Mina Marble, PharmD, BCPS, [place] 05/18/2020, 10:48 AM

## 2020-05-05 ENCOUNTER — Inpatient Hospital Stay (HOSPITAL_COMMUNITY): Payer: Medicare Other

## 2020-05-05 DIAGNOSIS — R6521 Severe sepsis with septic shock: Secondary | ICD-10-CM

## 2020-05-05 DIAGNOSIS — A419 Sepsis, unspecified organism: Principal | ICD-10-CM

## 2020-05-05 DIAGNOSIS — C189 Malignant neoplasm of colon, unspecified: Secondary | ICD-10-CM

## 2020-05-05 DIAGNOSIS — Z9911 Dependence on respirator [ventilator] status: Secondary | ICD-10-CM

## 2020-05-05 DIAGNOSIS — C787 Secondary malignant neoplasm of liver and intrahepatic bile duct: Secondary | ICD-10-CM

## 2020-05-05 DIAGNOSIS — E8809 Other disorders of plasma-protein metabolism, not elsewhere classified: Secondary | ICD-10-CM

## 2020-05-05 DIAGNOSIS — R627 Adult failure to thrive: Secondary | ICD-10-CM | POA: Diagnosis not present

## 2020-05-05 DIAGNOSIS — J9601 Acute respiratory failure with hypoxia: Secondary | ICD-10-CM | POA: Diagnosis not present

## 2020-05-05 DIAGNOSIS — Z515 Encounter for palliative care: Secondary | ICD-10-CM

## 2020-05-05 LAB — BASIC METABOLIC PANEL
Anion gap: 15 (ref 5–15)
Anion gap: 13 (ref 5–15)
BUN: 73 mg/dL — ABNORMAL HIGH (ref 8–23)
BUN: 73 mg/dL — ABNORMAL HIGH (ref 8–23)
CO2: 16 mmol/L — ABNORMAL LOW (ref 22–32)
CO2: 16 mmol/L — ABNORMAL LOW (ref 22–32)
Calcium: 8 mg/dL — ABNORMAL LOW (ref 8.9–10.3)
Calcium: 8 mg/dL — ABNORMAL LOW (ref 8.9–10.3)
Chloride: 117 mmol/L — ABNORMAL HIGH (ref 98–111)
Chloride: 117 mmol/L — ABNORMAL HIGH (ref 98–111)
Creatinine, Ser: 2.3 mg/dL — ABNORMAL HIGH (ref 0.61–1.24)
Creatinine, Ser: 2.14 mg/dL — ABNORMAL HIGH (ref 0.61–1.24)
GFR, Estimated: 30 mL/min — ABNORMAL LOW (ref >60)
GFR, Estimated: 33 mL/min — ABNORMAL LOW (ref >60)
Glucose, Bld: 93 mg/dL (ref 70–99)
Glucose, Bld: 79 mg/dL (ref 70–99)
Potassium: 6.3 mmol/L (ref 3.5–5.1)
Potassium: 6 mmol/L — ABNORMAL HIGH (ref 3.5–5.1)
Sodium: 148 mmol/L — ABNORMAL HIGH (ref 135–145)
Sodium: 146 mmol/L — ABNORMAL HIGH (ref 135–145)

## 2020-05-05 LAB — MAGNESIUM: Magnesium: 1.7 mg/dL (ref 1.7–2.4)

## 2020-05-05 LAB — GLUCOSE, CAPILLARY
Glucose-Capillary: 62 mg/dL — ABNORMAL LOW (ref 70–99)
Glucose-Capillary: 65 mg/dL — ABNORMAL LOW (ref 70–99)
Glucose-Capillary: 72 mg/dL (ref 70–99)
Glucose-Capillary: 83 mg/dL (ref 70–99)
Glucose-Capillary: 90 mg/dL (ref 70–99)

## 2020-05-05 LAB — MRSA PCR SCREENING: MRSA by PCR: POSITIVE — AB

## 2020-05-05 LAB — CBC
HCT: 24.5 % — ABNORMAL LOW (ref 39.0–52.0)
HCT: 26.1 % — ABNORMAL LOW (ref 39.0–52.0)
Hemoglobin: 7.1 g/dL — ABNORMAL LOW (ref 13.0–17.0)
Hemoglobin: 7.6 g/dL — ABNORMAL LOW (ref 13.0–17.0)
MCH: 26.1 pg (ref 26.0–34.0)
MCH: 26.2 pg (ref 26.0–34.0)
MCHC: 29 g/dL — ABNORMAL LOW (ref 30.0–36.0)
MCHC: 29.1 g/dL — ABNORMAL LOW (ref 30.0–36.0)
MCV: 90.1 fL (ref 80.0–100.0)
MCV: 90 fL (ref 80.0–100.0)
Platelets: 305 10*3/uL (ref 150–400)
Platelets: 327 10*3/uL (ref 150–400)
RBC: 2.72 MIL/uL — ABNORMAL LOW (ref 4.22–5.81)
RBC: 2.9 MIL/uL — ABNORMAL LOW (ref 4.22–5.81)
RDW: 21 % — ABNORMAL HIGH (ref 11.5–15.5)
RDW: 20.8 % — ABNORMAL HIGH (ref 11.5–15.5)
WBC: 5 10*3/uL (ref 4.0–10.5)
WBC: 8.5 10*3/uL (ref 4.0–10.5)
nRBC: 5 % — ABNORMAL HIGH (ref 0.0–0.2)
nRBC: 2.3 % — ABNORMAL HIGH (ref 0.0–0.2)

## 2020-05-05 LAB — LACTIC ACID, PLASMA: Lactic Acid, Venous: 4.7 mmol/L (ref 0.5–1.9)

## 2020-05-05 LAB — POCT I-STAT 7, (LYTES, BLD GAS, ICA,H+H)
Acid-base deficit: 6 mmol/L — ABNORMAL HIGH (ref 0.0–2.0)
Bicarbonate: 19.1 mmol/L — ABNORMAL LOW (ref 20.0–28.0)
Calcium, Ion: 1.2 mmol/L (ref 1.15–1.40)
HCT: 20 % — ABNORMAL LOW (ref 39.0–52.0)
Hemoglobin: 6.8 g/dL — CL (ref 13.0–17.0)
O2 Saturation: 100 %
Patient temperature: 36.7
Potassium: 5.8 mmol/L — ABNORMAL HIGH (ref 3.5–5.1)
Sodium: 146 mmol/L — ABNORMAL HIGH (ref 135–145)
TCO2: 20 mmol/L — ABNORMAL LOW (ref 22–32)
pCO2 arterial: 36.2 mmHg (ref 32.0–48.0)
pH, Arterial: 7.329 — ABNORMAL LOW (ref 7.350–7.450)
pO2, Arterial: 318 mmHg — ABNORMAL HIGH (ref 83.0–108.0)

## 2020-05-05 LAB — URINE CULTURE: Culture: NO GROWTH

## 2020-05-05 LAB — PHOSPHORUS: Phosphorus: 4.9 mg/dL — ABNORMAL HIGH (ref 2.5–4.6)

## 2020-05-05 MED ORDER — DIPHENHYDRAMINE HCL 50 MG/ML IJ SOLN: 25.0000 mg | INTRAMUSCULAR | Status: DC | PRN

## 2020-05-05 MED ORDER — ACETAMINOPHEN 650 MG RE SUPP: 650.0000 mg | Freq: Four times a day (QID) | RECTAL | Status: DC | PRN

## 2020-05-05 MED ORDER — SODIUM CHLORIDE 0.9 % IV BOLUS
1000.0000 mL | Freq: Once | INTRAVENOUS | Status: AC
  Administered 2020-05-05: 1000 mL via INTRAVENOUS

## 2020-05-05 MED ORDER — MIDAZOLAM HCL 2 MG/2ML IJ SOLN: 2.0000 mg | INTRAMUSCULAR | Status: DC | PRN

## 2020-05-05 MED ORDER — VITAL HIGH PROTEIN PO LIQD: 1000.0000 mL | ORAL | Status: DC

## 2020-05-05 MED ORDER — GLYCOPYRROLATE 1 MG PO TABS
1.0000 mg | ORAL_TABLET | ORAL | Status: DC | PRN
  Filled 2020-05-05: qty 1

## 2020-05-05 MED ORDER — SODIUM CHLORIDE 0.9 % IV SOLN: INTRAVENOUS | Status: DC

## 2020-05-05 MED ORDER — DEXTROSE 50 % IV SOLN
INTRAVENOUS | Status: AC
  Filled 2020-05-05: qty 50

## 2020-05-05 MED ORDER — SODIUM ZIRCONIUM CYCLOSILICATE 10 G PO PACK
10.0000 g | PACK | Freq: Once | ORAL | Status: DC
  Filled 2020-05-05: qty 1

## 2020-05-05 MED ORDER — FENTANYL BOLUS VIA INFUSION
100.0000 ug | INTRAVENOUS | Status: DC | PRN
  Filled 2020-05-05: qty 100

## 2020-05-05 MED ORDER — LACTATED RINGERS IV BOLUS: 500.0000 mL | Freq: Once | INTRAVENOUS | Status: DC

## 2020-05-05 MED ORDER — HALOPERIDOL LACTATE 5 MG/ML IJ SOLN: 2.5000 mg | INTRAMUSCULAR | Status: DC | PRN

## 2020-05-05 MED ORDER — POLYVINYL ALCOHOL 1.4 % OP SOLN
1.0000 [drp] | Freq: Four times a day (QID) | OPHTHALMIC | Status: DC | PRN
  Filled 2020-05-05: qty 15

## 2020-05-05 MED ORDER — DEXTROSE 50 % IV SOLN
12.5000 g | Freq: Once | INTRAVENOUS | Status: AC
  Administered 2020-05-05: 12.5 g via INTRAVENOUS

## 2020-05-05 MED ORDER — ACETAMINOPHEN 325 MG PO TABS: 650.0000 mg | ORAL_TABLET | Freq: Four times a day (QID) | ORAL | Status: DC | PRN

## 2020-05-05 MED ORDER — PROSOURCE TF PO LIQD: 45.0000 mL | Freq: Two times a day (BID) | ORAL | Status: DC

## 2020-05-05 MED ORDER — SODIUM BICARBONATE 8.4 % IV SOLN: 100.0000 meq | Freq: Once | INTRAVENOUS | Status: DC

## 2020-05-05 MED ORDER — GLYCOPYRROLATE 0.2 MG/ML IJ SOLN
0.2000 mg | INTRAMUSCULAR | Status: DC | PRN
  Administered 2020-05-05: 0.2 mg via INTRAVENOUS

## 2020-05-05 MED ORDER — FENTANYL CITRATE (PF) 100 MCG/2ML IJ SOLN: 50.0000 ug | INTRAMUSCULAR | Status: DC | PRN

## 2020-05-05 MED ORDER — DEXTROSE 5 % IV SOLN: INTRAVENOUS | Status: DC

## 2020-05-05 MED ORDER — DEXTROSE 50 % IV SOLN
12.5000 g | INTRAVENOUS | Status: AC
  Administered 2020-05-05: 12.5 g via INTRAVENOUS
  Filled 2020-05-05: qty 50

## 2020-05-05 MED ORDER — CHLORHEXIDINE GLUCONATE CLOTH 2 % EX PADS: 6.0000 | MEDICATED_PAD | Freq: Every day | CUTANEOUS | Status: DC

## 2020-05-05 MED ORDER — FENTANYL 2500MCG IN NS 250ML (10MCG/ML) PREMIX INFUSION
0.0000 ug/h | INTRAVENOUS | Status: DC
  Administered 2020-05-05 (×2): 400 ug/h via INTRAVENOUS
  Filled 2020-05-05: qty 250

## 2020-05-05 MED ORDER — ALBUTEROL (5 MG/ML) CONTINUOUS INHALATION SOLN
10.0000 mg/h | INHALATION_SOLUTION | RESPIRATORY_TRACT | Status: DC
  Filled 2020-05-05: qty 20

## 2020-05-05 MED ORDER — GLYCOPYRROLATE 0.2 MG/ML IJ SOLN
0.2000 mg | INTRAMUSCULAR | Status: DC | PRN
  Filled 2020-05-05: qty 1

## 2020-05-05 MED ORDER — SODIUM ZIRCONIUM CYCLOSILICATE 10 G PO PACK
10.0000 g | PACK | Freq: Once | ORAL | Status: AC
  Administered 2020-05-05: 10 g
  Filled 2020-05-05: qty 1

## 2020-05-05 MED ORDER — MUPIROCIN 2 % EX OINT: TOPICAL_OINTMENT | Freq: Two times a day (BID) | CUTANEOUS | Status: DC

## 2020-05-06 LAB — PATHOLOGIST SMEAR REVIEW

## 2020-05-09 ENCOUNTER — Ambulatory Visit: Payer: Medicare Other | Admitting: Oncology

## 2020-05-09 LAB — CULTURE, BLOOD (ROUTINE X 2)
Culture: NO GROWTH
Culture: NO GROWTH
Special Requests: ADEQUATE

## 2020-06-04 NOTE — Progress Notes (Signed)
I spoke to Scott [name] from [place]. They have spoken to Scott [name] from Palliative care this morning. To change his code status or make any decisions regarding his care forthcoming the decision will be made by Scott [name] and her colleagues as a Radio broadcast assistant. She understands his severe illness and would not want to harm him. She needs a letter sent to her office describing his condition and our recommendations for his care moving forward. We will send this and await their answer regarding goals of care moving forward and changing code status to better reflect his current status.  Scott Hy, DO 05-09-20 11:39 AM [facility]

## 2020-06-04 NOTE — Death Summary Note (Addendum)
Physician Discharge Summary  Patient ID: Scott [name] MRN: [id number] DOB/AGE: 1950/12/23 70 y.o.  Admit date: 05/13/2020 Discharge date: 06/04/20  Admission Diagnoses: Dyskinesia he resides in a nursing home secondary to his schizophrenia and multiple medical problems that are fully listed below and are significant for failure to thrive in outpatient setting.  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  He presents to [facility] emergency department today with acute respiratory failure, acute renal failure, hyperkalemia, hypoglycemia and generalized failure to thrive intubated by the emergency department physician.  Fluid resuscitation has been started with the sepsis protocol.  He has been placed on empirical antimicrobial therapy for pneumonia.   -Acute on chronic hypoxic ventilator dependent respiratory failure -History of COPD on home oxygen with known metastatic adenocarcinoma from recent liver biopsy 11/15/2021and mets to left humerus right ribs.   -left lower lobe pneumonia with septic shock -Acute metabolic acidosis due to lactic acidosis  -Acute Kidney Injury -Failure to thrive -Septic shock -Hyperkalemia    Discharge Diagnoses:   [name] (DOB [birth year]) is a 70 year old man with a history of dementia, schizophrenia, metastatic colon cancer with metastases to bone and liver. He presented to the hospital on 05/28/2020 with pneumonia, acute respiratory failure requiring mechanical ventilation, septic shock, acute kidney injury. He is currently admitted to [facility] in the ICU under my care. Palliative Care has also been consulted to help with managing his care.   He has life threatening illness due to respiratory failure and septic shock. In the setting of stage IV cancer with failure to thrive and dementia at baseline, it is unlikely that treatment of his acute illnesses will  significantly change his long-term prognosis. I worry that with this degree of organ failure that he is unlikely to be able to live to hospital discharge. If he did live to hospital discharge, I think he would be more debilitated than his previous baseline. My worry is the long-term pain and discomfort associated with having advanced cancer that certainly would eventually be life-limiting.  It is likely that we are prolonging suffering by prolonging his life using aggressive care measures at this time. In the event of a cardiac arrest despite all aggressive care measures, it would be unlikely that CPR would be successful to revive him.   -Acute on chronic hypoxic ventilator dependent respiratory failure -History of COPD on home oxygen -With known adenocarcinoma from recent liver biopsy 04/18/2020 but shows mets to left humerus right ribs.   -Septic shock due to LLL pneumonia -Hyperkalemia -AKI -Acute metabolic acidosis due to lactic acidosis -Hypernatremia -Hyperphosphatemia -Failure to thrive  -History of schizophrenia and resides in a nursing home due to dementia -Tardive dyskinesia -Hypoglycemia -Acute on chronic anemia    Discharged Condition: Deceased   Hospital Course:  Pt was admitted and intubated and placed on broad spectrum antibiotics, however in the setting multiple baseline debilitating health problems and failure to thrive, Palliative care was consulted and we reviewed the case with his guardian at [place] who agreed with transitioning to comfort-based care.  The patient was terminally extubated and treated with analgesics and anxiolytics to manage pain, air hunger, and anxiety.  Time of death 18:06 on 2020/06/04.  Consults:  Palliative Care  Significant Diagnostic Studies:   Treatments: IV hydration, antibiotics: vancomycin and cefepime, analgesia: fentanyl, anticoagulation: and respiratory therapy: Intubation Comfort-focused care    Otilio Carpen Gleason, PA-C  I have  reviewed the discharge summary and agree with the documentation.  Signed: Julian Hy 05-12-20, 6:06 PM

## 2020-06-04 NOTE — Progress Notes (Signed)
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  He presents to [facility] emergency department today acute respiratory failure, acute renal failure, hyperkalemia, hypoglycemia and generalized failure to thrive intubated by the emergency department physician.  Fluid resuscitation has been started with the sepsis protocol.  He is often placed on empirical antimicrobial therapy.  His case manager department social services been called and asked to set up a time to meet with family on her to address CODE STATUS.  Currently he seems to be responding to fluid resuscitation antimicrobial therapy.  Past Medical History   Past Medical History:  Diagnosis Date  . Aggression aggravated 08/20/2018  . Anxiety   . Chronic hyponatremia 05/2013, 11/2013  . Dementia ([place])   . Dyslipidemia   . GERD (gastroesophageal reflux disease)   . Hypertension   . Hypoglycemia   . Schizophrenia ([place])   . Seizures ([place])   . Syncope   . Tardive dyskinesia   . Urinary tract infection   . Vitamin D deficiency      Significant Hospital Events     Consults:  Palliative Care  Procedures:  05/26/2020 intubation per EDP  Significant Diagnostic  Tests:    Micro Data:  06/03/2020 blood cultures x2 05/15/2020 sputum culture  Antimicrobials:  05/23/2020 vancomycin 05/06/2020 cefepime  Interim history/subjective:  Remains on MV.  Objective   Blood pressure 98/60, pulse 87, temperature 98.6 F (37 C), resp. rate (!) 28, weight 88.6 kg, SpO2 100 %.    Vent Mode: PRVC FiO2 (%):  [50 %-100 %] 50 % Set Rate:  [28 bmp] 28 bmp Vt Set:  [600 mL] 600 mL PEEP:  [10 XKG81-85 cmH20] 10 cmH20 Plateau Pressure:  [22 cmH20-23 cmH20] 23 cmH20   Intake/Output Summary (Last 24 hours) at 2020-05-22 1303 Last data filed at May 22, 2020 1100 Gross per 24 hour  Intake 6345.91 ml  Output 880 ml  Net 5465.91 ml   Filed Weights   05-22-20 0400  Weight: 88.6 kg    Examination: General: critically ill appearing man laying in bed in NAD HENT: [place]/AT, eyes anicteric, ETT in place. Lungs: rhonchi bilaterally, breathing synchronously on the vent. Cardiovascular: RRR, no murmurs Abdomen: Firm, nontender. Well-healed vertical incision from prior surgery. Hypoactive bowel sounds. Extremities: no peripheral edema, no cyanosis Neuro: pinpoint pupils, RASS -5 GU: foley in place  CXR personally reviewed> LLL pneumonia, overall improvement in left lung opacification on positive pressure  Resolved Hospital Problem list     Assessment & Plan:  Acute on chronic hypoxic ventilator dependent respiratory failure History of COPD on home oxygen With known adenocarcinoma from recent liver biopsy 04/18/2020 but shows mets to left humerus right ribs.  Suspected left lower lobe pneumonia. -LTVV, 4-8cc/kg IBW with goal Pplat<30 and  DP<15. Titrate PEEP & FiO2 per ARDS protocol. 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 Letter sent via email to inform the DSS team of his current status and our recommendations for his treatment. Letter has been sent via secure email and Scott [name] will discuss with her team to determine if there will be changes to his care forthcoming.  Best practice (evaluated daily)   Diet: NPO Pain/Anxiety/Delirium protocol (if indicated): Not indicated VAP protocol (if indicated): In place DVT prophylaxis: Heparin GI prophylaxis: PPI Glucose control: Sliding scale insulin protocol Mobility: Bedrest last date of multidisciplinary goals of care discussion May 31, 2020 with Scott [name] via phone, Scott [name] from [place] Follow up goals of care discussion due Code Status: Full Disposition: ICU  Labs   CBC: Recent Labs  Lab 05/15/2020 1006 05/07/2020 1022 05/22/2020 1320 05/22/2020 1526 05/31/20 0036 2020-05-31 0334 05-31-2020 0914  WBC 8.0  --   --  4.8 5.0  --  8.5  NEUTROABS 5.9  --   --   --   --   --   --   HGB 7.7*   < > 7.5* 7.2* 7.1* 6.8* 7.6*  HCT 27.3*   < > 22.0* 26.6* 24.5* 20.0* 26.1*  MCV 90.7   --   --  93.0 90.1  --  90.0  PLT 430*  --   --  318 305  --  327   < > = values in this interval not displayed.    Basic Metabolic Panel: Recent Labs  Lab 05/13/2020 1006 05/07/2020 1022 05/29/2020 1026 05/23/2020 1026 05/16/2020 1320 05/15/2020 1526 May 31, 2020 0036 05-31-20 0334 May 31, 2020 0914  NA 149*   < > 148*   < > 150* 149* 148* 146* 146*  K 6.4*   < > 6.3*   < > 6.3* 5.8* 6.3* 5.8* 6.0*  CL 117*  --  123*  --   --  116* 117*  --  117*  CO2 14*  --   --   --   --  14* 16*  --  16*  GLUCOSE 53*  --  51*  --   --  130* 93  --  79  BUN 81*  --  81*  --   --  80* 73*  --  73*  CREATININE 2.43*  --  2.30*  --   --  2.38* 2.30*  --  2.14*  CALCIUM 8.6*  --   --   --   --  8.5* 8.0*  --  8.0*  MG  --   --   --   --   --   --  1.7  --   --   PHOS  --   --   --   --   --   --  4.9*  --   --    < > = values in this interval not displayed.   GFR: Estimated Creatinine Clearance: 40.6 mL/min (A) (by C-G formula based on SCr of 2.14 mg/dL (H)). Recent Labs  Lab 05/22/2020 1006 06/01/2020 1406 05/07/2020 1526 05/23/2020 1538 2020/05/21 0036 2020-05-21 0625 05/21/20 0914  WBC 8.0  --  4.8  --  5.0  --  8.5  LATICACIDVEN 5.2* 7.0*  --  7.1*  --  4.7*  --     Liver Function Tests: Recent Labs  Lab 05/27/2020 1006  AST 127*  ALT 57*  ALKPHOS 745*  BILITOT 1.2  PROT 6.7  ALBUMIN 1.9*   Recent Labs  Lab 06/01/2020 1006  LIPASE 25   No results for input(s): AMMONIA in the last 168 hours.  ABG    Component Value Date/Time   PHART 7.329 (L) 21-May-2020 0334   PCO2ART 36.2 05-21-2020 0334   PO2ART 318 (H) 05-21-2020 0334   HCO3 19.1 (L) 05/21/2020 0334   TCO2 20 (L) 2020/05/21 0334   ACIDBASEDEF 6.0 (H) May 21, 2020 0334   O2SAT 100.0 05-21-20 0334     Coagulation Profile: Recent Labs  Lab 05/19/2020 1006  INR 1.4*    Cardiac Enzymes: No results for input(s): CKTOTAL, CKMB, CKMBINDEX, TROPONINI in the last 168 hours.  HbA1C: Hemoglobin A1C  Date/Time Value Ref Range Status   11/15/2013 06:16 AM 5.8 4.2 - 6.3 % Final    Comment:    The American Diabetes Association recommends that a primary goal of therapy should be <7% and that physicians should reevaluate the treatment regimen in patients with HbA1c values consistently >8%.   11/05/2013 05:37 AM 6.1 4.2 - 6.3 % Final    Comment:    The American Diabetes Association recommends that a primary goal of therapy should be <7% and that physicians should reevaluate the treatment regimen in patients with HbA1c values consistently >8%.    Hgb A1c MFr Bld  Date/Time Value Ref Range Status  03/13/2020 01:28 PM 7.6 (H) 4.8 - 5.6 % Final    Comment:    (NOTE) Pre diabetes:          5.7%-6.4%  Diabetes:              >6.4%  Glycemic control for   <7.0% adults with diabetes   10/14/2017 02:54 PM 5.5 4.8 - 5.6 % Final    Comment:    (NOTE) Pre diabetes:          5.7%-6.4% Diabetes:              >6.4% Glycemic control for   <7.0% adults with diabetes     CBG: Recent Labs  Lab 05-21-20 0035 05/21/2020 0354 05-21-20 0436 May 21, 2020 0803 05/21/2020 1203  GLUCAP 83 62* 90 72 65*     This patient is critically ill with multiple organ system failure which requires frequent high complexity decision making, assessment, support, evaluation, and titration of therapies. This was completed through the application of advanced monitoring technologies and extensive interpretation of multiple databases. During this encounter critical care time was devoted to patient care services described in this note for 65 minutes.  Julian Hy, DO May 21, 2020 1:27 PM [facility]

## 2020-06-04 NOTE — Plan of Care (Signed)
Death certificate completed in [place].  Julian Hy, DO 2020-06-02 7:24 PM [facility]

## 2020-06-04 NOTE — Plan of Care (Signed)
Pt opens eyes when spoken to but does not follow commands. Increased peripheral vasopressor requirement without CVC--request made. Because of pending [place] conference, central access request was denied. Per MD, titrate levo as needed through PIV. 1L Fluid bolus given x2 with temporary improvement in BP, but does not last. Orders for 3rd 1L bolus. 12.5g dextrose given for low CBG. Pt tolerating ventilator better now that versed has been added. UOP about 48ml/hr--lots of sediment in urine. RN will continue to monitor closely.   Problem: Coping: Goal: Level of anxiety will decrease Outcome: Progressing   Problem: Activity: Goal: Ability to tolerate increased activity will improve Outcome: Progressing   Problem: Respiratory: Goal: Ability to maintain a clear airway and adequate ventilation will improve Outcome: Progressing   Problem: Clinical Measurements: Goal: Ability to maintain clinical measurements within normal limits will improve Outcome: Not Progressing Goal: Will remain free from infection Outcome: Not Progressing Goal: Diagnostic test results will improve Outcome: Not Progressing Goal: Respiratory complications will improve Outcome: Not Progressing   Problem: Activity: Goal: Risk for activity intolerance will decrease Outcome: Not Progressing   Problem: Nutrition: Goal: Adequate nutrition will be maintained Outcome: Not Progressing   Problem: Role Relationship: Goal: Method of communication will improve Outcome: Not Progressing   Problem: Education: Goal: Knowledge of General Education information will improve Description: Including pain rating scale, medication(s)/side effects and non-pharmacologic comfort measures Outcome: Not Applicable   Problem: Health Behavior/Discharge Planning: Goal: Ability to manage health-related needs will improve Outcome: Not Applicable

## 2020-06-04 NOTE — Care Plan (Signed)
I received a call from Scott [name] with DSS that she and the DSS team had reviewed my letter. They agree with both of my recommendations. Bedside RN Nicki Reaper also spoke with her to verify. DNR order placed. Comfort-focused orders placed. Will terminally extubate this afternoon.  Scott Hy, DO 05/06/2020 1:50 PM [facility]

## 2020-06-04 NOTE — Consult Note (Signed)
Consultation Note Date: 2020/05/15   Patient Name: Scott [name]  DOB: 02-Dec-1950  MRN: [id number]  Age / Sex: 70 y.o., male  PCP: Lauretta Grill, NP Referring Physician: Julian Hy, DO  Reason for Consultation: Establishing goals of care and Psychosocial/spiritual support  HPI/Patient Profile: 70 y.o. male   admitted on 05/17/2020 with PMH significant for recently diagnosed adenocarcinoma of the colon with metastasis to liver and bone, schizophrenia ,  COPD, chronic renal disease, seizure disorder and overall failure to thrive.  Patient is a resident of long-term SNF facility, he is a ward of the state.  Patient was admitted through the emergency room for treatment and stabilization of altered mental status  He is currently in the intensive care unit, intubated.  Albumin 1.9, Creatine 2.14.  His terminal cancer diagnosis has not been fully evaluated at this point in time but it is highly unlikely that he would be a systemic therapy candidate.  His prognosis is long term prognosis is poor  Legal guardian faces treatment option decisions, advanced directive decisions and anticipatory care needs.    Clinical Assessment and Goals of Care:  This NP Wadie Lessen reviewed medical records, received report from team, assessed the patient and then meet at the patient's bedside and spoke to his legal guardian/ [name] to discuss diagnosis, prognosis, GOC, EOL wishes disposition and options.   Concept of Palliative Care was introduced as specialized medical care for people and their families living with serious illness.  If focuses on providing relief from the symptoms and stress of a serious illness.  The goal is to improve quality of life for both the patient and the family.  A  discussion was had today regarding advanced directives.  Concepts specific to code status, artifical feeding and hydration,  continued IV antibiotics and rehospitalization was had.  The difference between a aggressive medical intervention path  and a palliative comfort care path for this patient at this time was had.  MOST form  was discussed.  Education regarding the difference between aggressive medical intervention path and a palliative comfort path for this patient at this time in this situation was detailed.  [name] verbalizes an understanding of the seriousness of the current medical situation the importance of pending decisions regarding treatment plan.  I will discuss with Dr. Alfonso Patten and Mardelle Matte to will call and discussed medical situation with the guardian.  After that conversation,  guardian will discuss and update  the  Director/ [facility] DSS for discussion and decision for treatment plan moving forward for Mr Ballengee.    Natural trajectory and expectations at EOL were discussed.  Questions and concerns addressed.      PMT will continue to support holistically.         Primary Diagnoses: Present on Admission: . Sepsis ([place])   I have reviewed the medical record, interviewed the patient and family, and examined the patient. The following aspects are pertinent.  Past Medical History:  Diagnosis Date  . Aggression aggravated 08/20/2018  .  Anxiety   . Chronic hyponatremia 05/2013, 11/2013  . Dementia ([place])   . Dyslipidemia   . GERD (gastroesophageal reflux disease)   . Hypertension   . Hypoglycemia   . Schizophrenia ([place])   . Seizures ([place])   . Syncope   . Tardive dyskinesia   . Urinary tract infection   . Vitamin D deficiency    Social History   Socioeconomic History  . Marital status: Single    Spouse name: Not on file  . Number of children: Not on file  . Years of education: Not on file  . Highest education level: Not on file  Occupational History  . Not on file  Tobacco Use  . Smoking status: Current Every Day Smoker    Packs/day: 0.50    Types:  Cigarettes  . Smokeless tobacco: Never Used  Vaping Use  . Vaping Use: Never used  Substance and Sexual Activity  . Alcohol use: No  . Drug use: No  . Sexual activity: Not Currently  Other Topics Concern  . Not on file  Social History Narrative  . Not on file   Social Determinants of Health   Financial Resource Strain:   . Difficulty of Paying Living Expenses: Not on file  Food Insecurity:   . Worried About Charity fundraiser in the Last Year: Not on file  . Ran Out of Food in the Last Year: Not on file  Transportation Needs:   . Lack of Transportation (Medical): Not on file  . Lack of Transportation (Non-Medical): Not on file  Physical Activity:   . Days of Exercise per Week: Not on file  . Minutes of Exercise per Session: Not on file  Stress:   . Feeling of Stress : Not on file  Social Connections:   . Frequency of Communication with Friends and Family: Not on file  . Frequency of Social Gatherings with Friends and Family: Not on file  . Attends Religious Services: Not on file  . Active Member of Clubs or Organizations: Not on file  . Attends Archivist Meetings: Not on file  . Marital Status: Not on file   No family history on file. Scheduled Meds: . chlorhexidine gluconate (MEDLINE KIT)  15 mL Mouth Rinse BID  . Chlorhexidine Gluconate Cloth  6 each Topical Daily  . docusate  100 mg Per Tube BID  . heparin  5,000 Units Subcutaneous Q8H  . mouth rinse  15 mL Mouth Rinse 10 times per day  . mupirocin ointment   Nasal BID  . pantoprazole sodium  40 mg Per Tube Daily  . polyethylene glycol  17 g Per Tube Daily   Continuous Infusions: . sodium chloride 125 mL/hr at May 25, 2020 1030  . ceFEPime (MAXIPIME) IV Stopped (05-25-20 0040)  . fentaNYL infusion INTRAVENOUS 250 mcg/hr (2020/05/25 0853)  . midazolam 3 mg/hr (25-May-2020 0600)  . norepinephrine (LEVOPHED) Adult infusion 18 mcg/min (05-25-20 1058)  . vancomycin     PRN Meds:.docusate, fentaNYL (SUBLIMAZE)  injection, midazolam, ondansetron (ZOFRAN) IV, polyethylene glycol Medications Prior to Admission:  Prior to Admission medications   Medication Sig Start Date End Date Taking? Authorizing Provider  atorvastatin (LIPITOR) 40 MG tablet Take 1 tablet (40 mg total) by mouth daily for 7 days. Patient taking differently: Take 40 mg by mouth at bedtime.  03/28/20  Yes Harvest Dark, MD  benztropine (COGENTIN) 1 MG tablet Take 1 tablet (1 mg total) by mouth 2 (two) times daily for 7 days. 03/28/20  Yes Harvest Dark, MD  clonazePAM (KLONOPIN) 0.5 MG tablet Take 1 tablet (0.5 mg total) by mouth 2 (two) times daily for 7 days. Patient taking differently: Take 0.5 mg by mouth every 12 (twelve) hours.  03/28/20  Yes Harvest Dark, MD  divalproex (DEPAKOTE ER) 250 MG 24 hr tablet Take 3 tablets (750 mg total) by mouth daily. 02/22/20  Yes Carrie Mew, MD  famotidine (PEPCID) 20 MG tablet Take 1 tablet (20 mg total) by mouth daily for 7 days. Patient taking differently: Take 20 mg by mouth at bedtime.  03/28/20  Yes Harvest Dark, MD  ferrous sulfate 325 (65 FE) MG tablet Take 1 tablet (325 mg total) by mouth 2 (two) times daily with a meal. Patient taking differently: Take 325 mg by mouth daily with breakfast.  04/19/20 05/19/20 Yes Masoud, Jarrett Soho, MD  haloperidol (HALDOL) 2 MG tablet Take 1 tablet (2 mg total) by mouth daily. 03/28/20  Yes Harvest Dark, MD  haloperidol (HALDOL) 5 MG tablet Take 1 tablet (5 mg total) by mouth at bedtime. 03/28/20  Yes Paduchowski, Lennette Bihari, MD  insulin glargine (SEMGLEE) 100 UNIT/ML Solostar Pen Inject 18 Units into the skin at bedtime.   Yes [provider]  metFORMIN (GLUCOPHAGE) 1000 MG tablet Take 1 tablet (1,000 mg total) by mouth 2 (two) times daily with a meal for 7 days. 03/28/20  Yes Harvest Dark, MD  NON FORMULARY Take 4 oz by mouth See admin instructions. Magic Cup Frozen Dessert- Eat 1 cup (4 ounces) by mouth with lunch  and dinner/evening meal   Yes [provider]  OLANZapine (ZYPREXA) 15 MG tablet Take 1 tablet (15 mg total) by mouth in the morning and at bedtime. 03/28/20  Yes Harvest Dark, MD  tamsulosin (FLOMAX) 0.4 MG CAPS capsule Take 1 capsule (0.4 mg total) by mouth daily. Patient taking differently: Take 0.4 mg by mouth at bedtime.  03/28/20  Yes Harvest Dark, MD  traZODone (DESYREL) 150 MG tablet Take 1 tablet (150 mg total) by mouth at bedtime. 03/28/20  Yes Harvest Dark, MD  insulin glargine (LANTUS) 100 UNIT/ML injection 18 units QHS Patient not taking: Reported on 05/13/2020 03/28/20 03/28/21  Harvest Dark, MD   No Known Allergies Review of Systems  Unable to perform ROS: Intubated    Physical Exam Constitutional:      Appearance: He is cachectic. He is ill-appearing.     Interventions: He is intubated.  Cardiovascular:     Rate and Rhythm: Normal rate.  Pulmonary:     Effort: He is intubated.  Skin:    General: Skin is warm and dry.     Vital Signs: BP 90/63   Pulse 84   Temp 98.4 F (36.9 C)   Resp (!) 28   Wt 88.6 kg   SpO2 100%   BMI 23.78 kg/m  Pain Scale: CPOT POSS *See Group Information*: S-Acceptable,Sleep, easy to arouse     SpO2: SpO2: 100 % O2 Device:SpO2: 100 % O2 Flow Rate: .O2 Flow Rate (L/min): 15 L/min  IO: Intake/output summary:   Intake/Output Summary (Last 24 hours) at 28-May-2020 1106 Last data filed at 2020/05/28 0900 Gross per 24 hour  Intake 5254 ml  Output 880 ml  Net 4374 ml    LBM:   Baseline Weight: Weight: 88.6 kg Most recent weight: Weight: 88.6 kg     Palliative Assessment/Data:      Discussed with Dr Carlis Abbott  Time In: 11:00 Time Out: 12:15 Time Total: 75 minutes Greater than  50%  of this time was spent counseling and coordinating care related to the above assessment and plan.  Signed by: Wadie Lessen, NP   Please contact Palliative Medicine Team phone at [phone] for questions and concerns.    For individual provider: See Shea Evans

## 2020-06-04 NOTE — Progress Notes (Signed)
Letter emailed securely to his guardian, Kizzie Ide [facility] DSS at her request.   To whom it may concern,  [name] (DOB 24-May-1951) is a 70 year old man with a history of dementia, schizophrenia, metastatic colon cancer with metastases to bone and liver. He presented to the hospital on 05/29/2020 with pneumonia, acute respiratory failure requiring mechanical ventilation, septic shock, acute kidney injury. He is currently admitted to [facility] in the ICU under my care. Palliative Care has also been consulted to help with managing his care.  He has life threatening illness due to respiratory failure and septic shock. In the setting of stage IV cancer with failure to thrive and dementia at baseline, it is unlikely that treatment of his acute illnesses will significantly change his long-term prognosis. I worry that with this degree of organ failure that he is unlikely to be able to live to hospital discharge. If he did live to hospital discharge, I think he would be more debilitated than his previous baseline. My worry is the long-term pain and discomfort associated with having advanced cancer that certainly would eventually be life-limiting.  It is likely that we are prolonging suffering by prolonging his life using aggressive care measures at this time. In the event of a cardiac arrest despite all aggressive care measures, it would be unlikely that CPR would be successful to revive him.   Recommendations: 1.) I recommend that we consider changing his code status to DNR to avoid painful resuscitation measures that would be unlikely to be successful. 2.) I recommend that we consider changing the focus of our care to maintain comfort and consider deescalating aggressive care measures. To fully focus on comfort, we would remove the endotracheal tube that is required to keep him on the ventilator and stopping the vasopressor medications that are maintaining his BP in a normal range.  We would use medications in this case to ensure that he did not feel pain, anxiety, or shortness of breath. We would still give supplemental oxygen to maintain comfort.    You can reach me at [facility] at pager number [phone]. If no answer, [phone].  Wadie Lessen, NP is also available to answer questions.  Please contact [facility] with your decisions after reviewing his case. Thank you.  Sincerely,  L. Ernest Mallick, DO [place]

## 2020-06-04 NOTE — Progress Notes (Signed)
[facility] Progress Note Patient Name: RUMEAL CULLIPHER DOB: 10/12/1950 MRN: [id number]   Date of Service  Jun 01, 2020  HPI/Events of Note  Hypotension - BP = 80/61 wht MAP = 68. LVEF = 70-75%. Remains on Norepinephrine IV infusion at 17 mcg/min via PIV. PCCM ground team notified of need for CVL and declined to place d/t poor prognosis and planned GOC discussion for tomorrow. Rx options limited at this time. Patient remains a full code at this time.   eICU Interventions  Plan: 1. Bolus with 0.9 NaCl 1 liter IV over 1 hour now.  2. Continue to titrate Norepinephrine IV infusion via PIV to support BP. We have no other choice given fact that ground team does not feel CVL is appropriate. Situation is less than ideal, however, options are limited given situation.      Intervention Category Major Interventions: Hypotension - evaluation and management  Serai Tukes Eugene 06/01/20, 1:17 AM

## 2020-06-04 NOTE — Progress Notes (Signed)
Referred pt to [facility], per request by bedside RN. Referral # [id number].  Spoke w/ Sharol Given.  Instructed to call if MD plans to do brain death testing, or if not, just call with cardiac TOD. Bedside RN notified.

## 2020-06-04 NOTE — Progress Notes (Addendum)
[facility] Progress Note Patient Name: DERVIN VORE DOB: 1950/07/18 MRN: [id number]   Date of Service  27-May-2020  HPI/Events of Note  Hyperkalemia - K+ = 6.3. Patient is currently on LR IV infusion at 150 mL/hour (which contains 4 meq/liter of K+). T wave not peaked on bedside monitor.  eICU Interventions  Plan: 1. Lokelma 10 gm per tube now. 2. D/C LR IV infusion. 3. 0.9 NaCl IV infusion at 125 mL/hour. 4. Repeat BMP at 9 AM.     Intervention Category Major Interventions: Electrolyte abnormality - evaluation and management  Houa Nie Eugene 2020/05/27, 2:02 AM

## 2020-06-04 NOTE — Progress Notes (Signed)
RT NOTE: RT extubated patient to comfort care per order. Positive cuff leak noted prior to extubation. RN is at bedside. RT will continue to monitor.

## 2020-06-04 DEATH — deceased

## 2022-06-27 IMAGING — US US BIOPSY CORE LIVER
1 series · 13 of 15 positions shown · non-contrast
Comparison: none

INDICATION: Liver metastases

[Series 1: us biopsy (liver) · 13 of 15 slices shown]
[im 1/15]
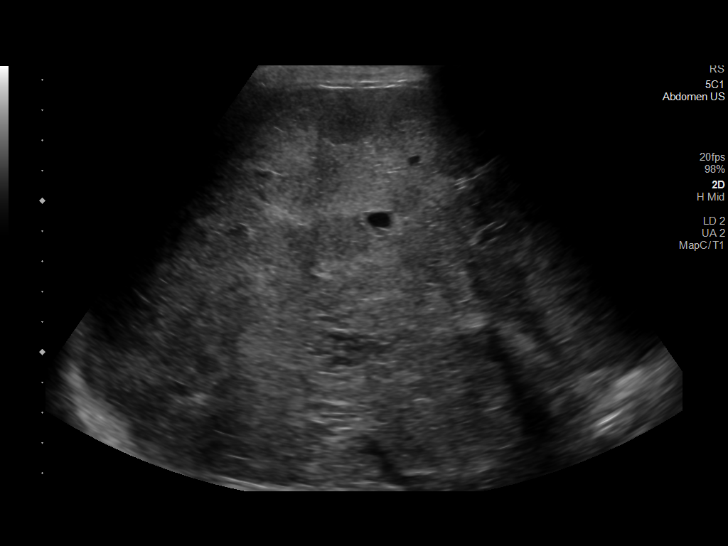
[im 2/15]
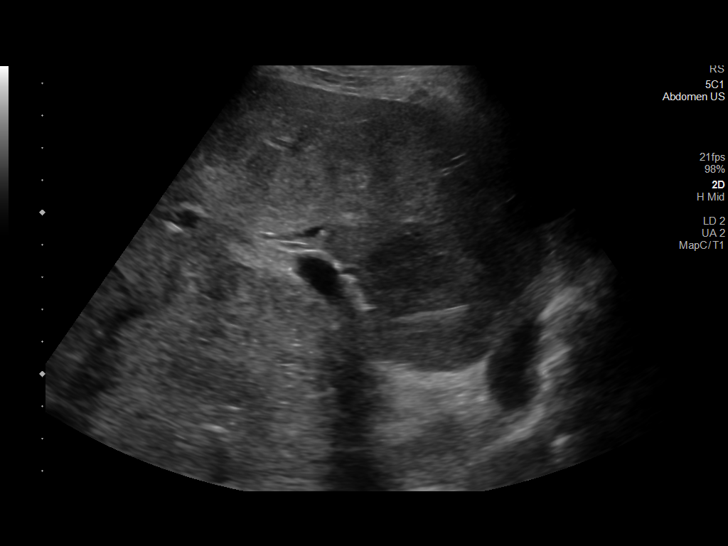
[im 3/15]
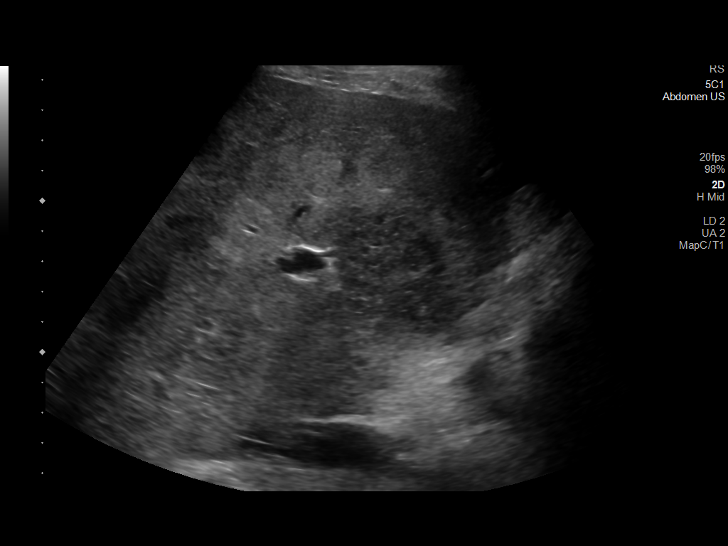
[im 5/15]
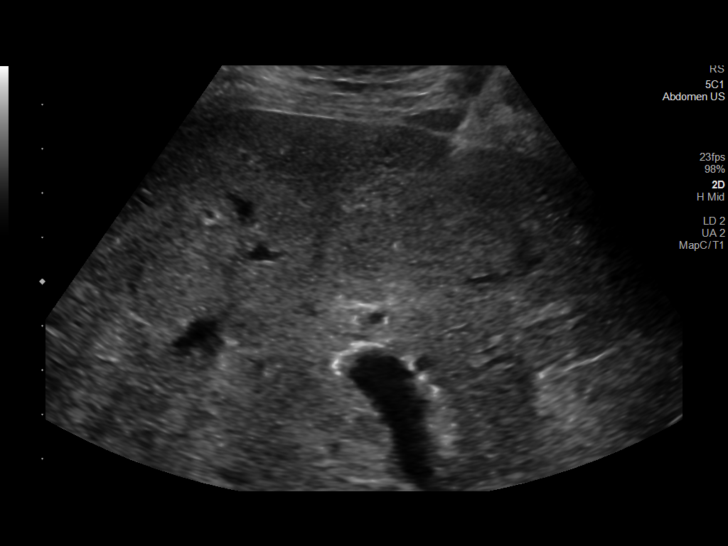
[im 6/15]
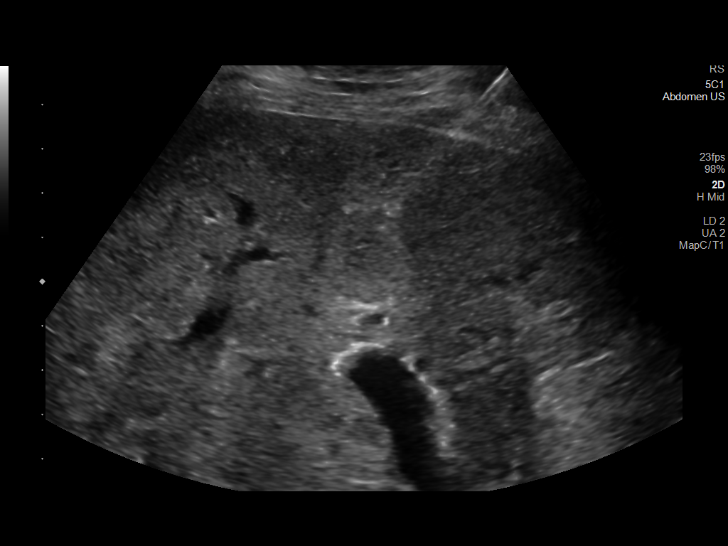
[im 7/15]
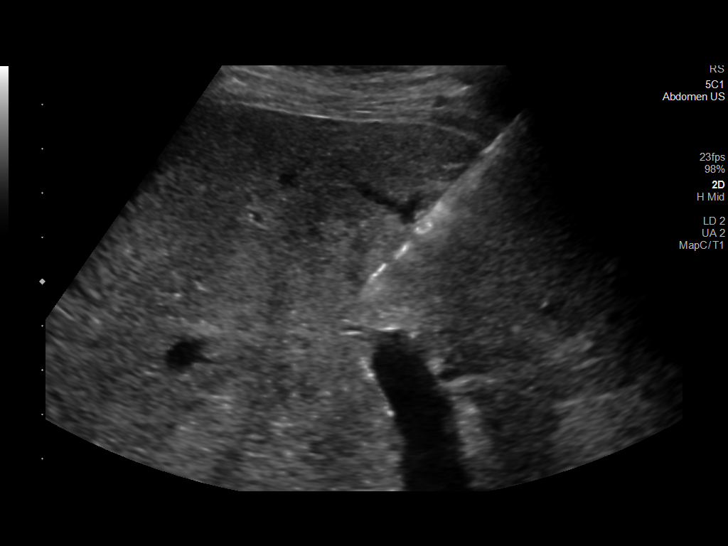
[im 8/15]
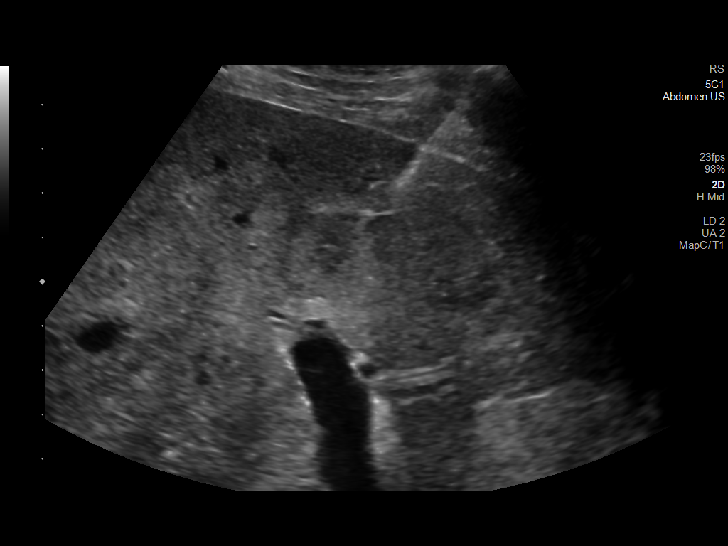
[im 9/15]
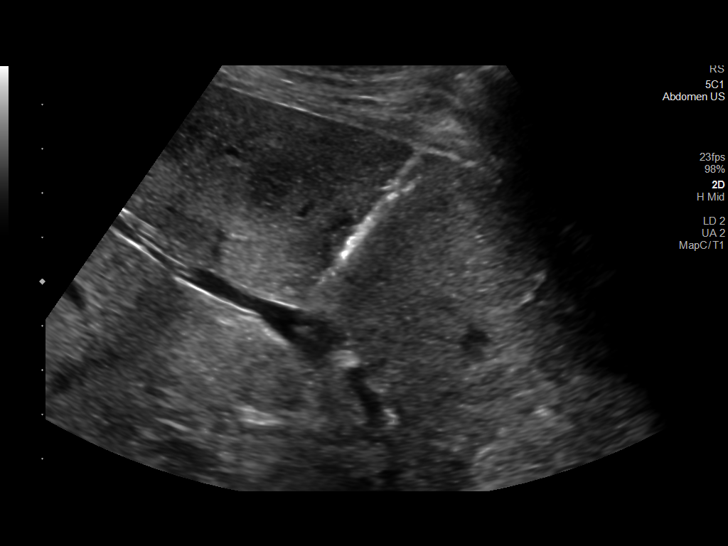
[im 10/15]
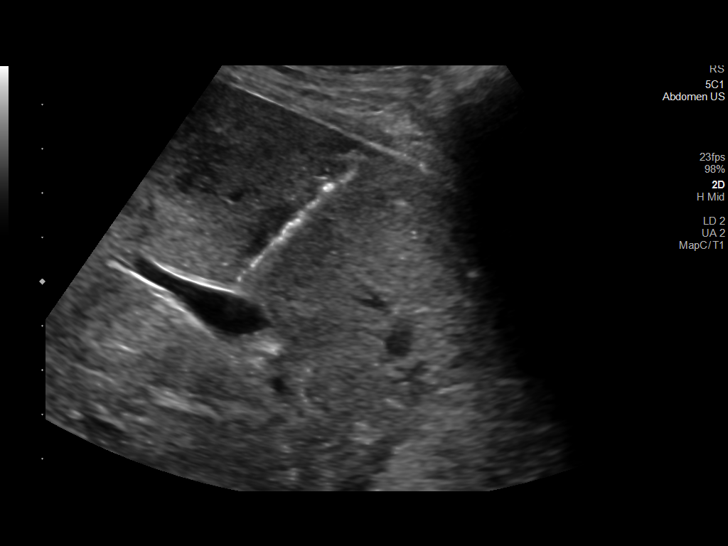
[im 11/15]
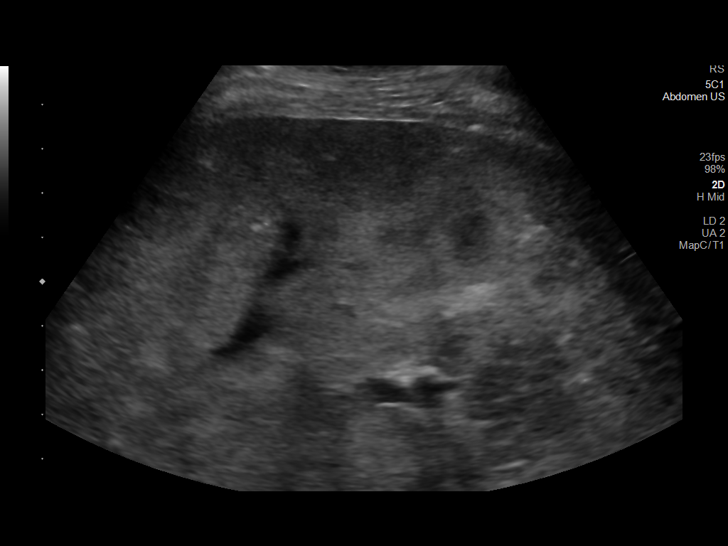
[im 13/15]
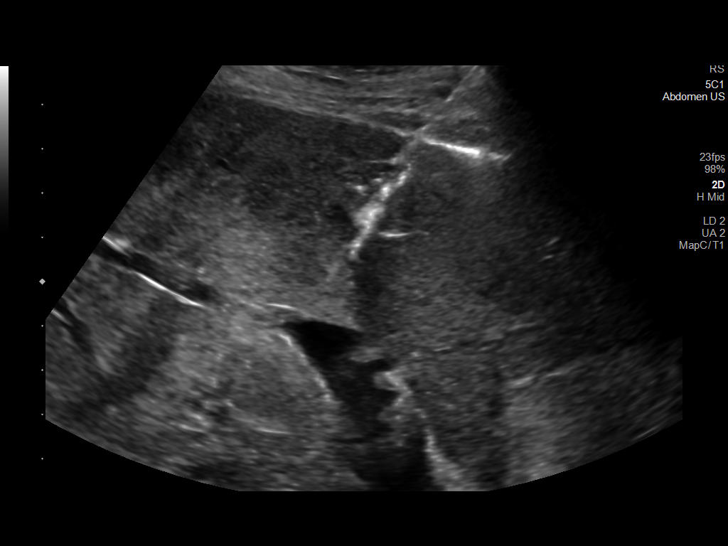
[im 14/15]
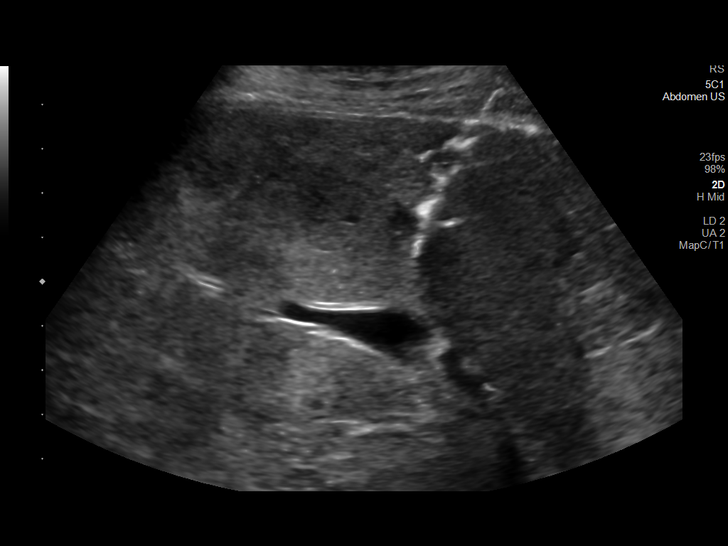
[im 15/15]
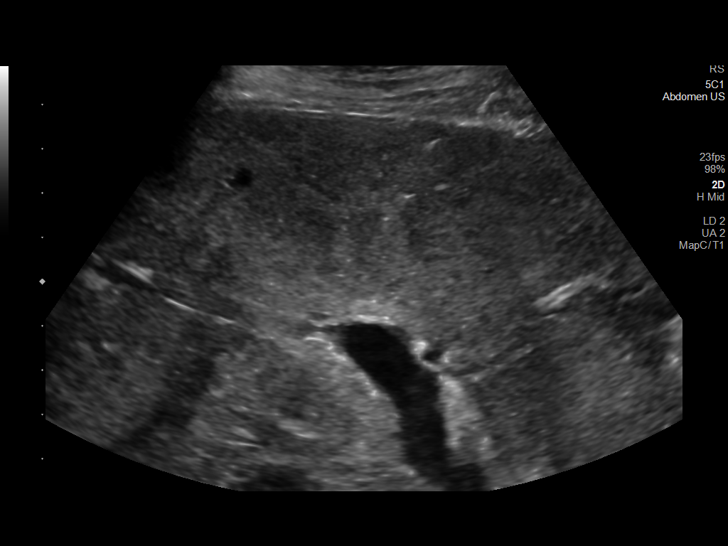

[13 of 15 positions shown; findings below may reference images not displayed]

EXAM:
ULTRASOUND GUIDED CORE BIOPSY OF RIGHT LIVER LESION

MEDICATIONS:
None.

ANESTHESIA/SEDATION:
Versed 0.5mg IV; Fentanyl 22mcg IV;

Moderate Sedation Time:  16 MINUTES

The patient was continuously monitored during the procedure by the
interventional radiology nurse under my direct supervision.

FLUOROSCOPY TIME:  Fluoroscopy Time: None.

COMPLICATIONS:
None immediate.

PROCEDURE:
The procedure, risks, benefits, and alternatives were explained to
the patient's power of attorney. Questions regarding the procedure
were encouraged and answered. The patient understands and consents
to the procedure.

Previous imaging reviewed. Preliminary ultrasound performed. A right
hepatic lesion was localized and marked.

Under sterile conditions and local anesthesia, a 17 gauge coaxial
guide was advanced in the mid axillary line through a lower
intercostal space to the right liver lesion. Needle position
confirmed with ultrasound. 3 18 gauge core biopsies obtained.
Samples were intact and non fragmented. These were placed in
formalin. Needle tract occluded with Gel-Foam. Postprocedure imaging
demonstrates no hemorrhage or hematoma. Patient tolerated biopsy
well.
FINDINGS: Imaging confirms needle placed into a right hepatic lesion for core
biopsy
IMPRESSION: Successful ultrasound-guided right liver lesion 18 gauge core
biopsies
